# Patient Record
Sex: Female | Born: 1965
Health system: Southern US, Community
[De-identification: ages and names within clinical notes are randomized; demographics above are authoritative.]

## PROBLEM LIST (undated history)

## (undated) DIAGNOSIS — E119 Type 2 diabetes mellitus without complications: Secondary | ICD-10-CM

## (undated) DIAGNOSIS — I10 Essential (primary) hypertension: Secondary | ICD-10-CM

## (undated) HISTORY — PX: FOOT SURGERY: SHX648

---

## 2005-06-29 ENCOUNTER — Ambulatory Visit: Payer: Self-pay | Admitting: Family Medicine

## 2007-12-15 ENCOUNTER — Emergency Department: Payer: Self-pay | Admitting: Emergency Medicine

## 2009-08-28 ENCOUNTER — Emergency Department: Payer: Self-pay | Admitting: Internal Medicine

## 2013-02-26 ENCOUNTER — Emergency Department: Payer: Self-pay | Admitting: Emergency Medicine

## 2013-02-26 LAB — URINALYSIS, COMPLETE
Blood: NEGATIVE
Ketone: NEGATIVE
Leukocyte Esterase: NEGATIVE
RBC,UR: NONE SEEN /HPF (ref 0–5)
Specific Gravity: 1.02 (ref 1.003–1.030)
Squamous Epithelial: <1
WBC UR: <1 /HPF (ref 0–5)

## 2013-02-26 LAB — CBC
MCHC: 33.9 g/dL (ref 32.0–36.0)
MCV: 91 fL (ref 80–100)
Platelet: 252 10*3/uL (ref 150–440)
RBC: 3.85 10*6/uL (ref 3.80–5.20)
RDW: 12.6 % (ref 11.5–14.5)
WBC: 5.4 10*3/uL (ref 3.6–11.0)

## 2013-02-26 LAB — COMPREHENSIVE METABOLIC PANEL
Albumin: 3.5 g/dL (ref 3.4–5.0)
Anion Gap: 6 — ABNORMAL LOW (ref 7–16)
Bilirubin,Total: 0.2 mg/dL (ref 0.2–1.0)
Calcium, Total: 8.7 mg/dL (ref 8.5–10.1)
Chloride: 101 mmol/L (ref 98–107)
Co2: 29 mmol/L (ref 21–32)
Glucose: 343 mg/dL — ABNORMAL HIGH (ref 65–99)
Osmolality: 284 (ref 275–301)
Potassium: 3.5 mmol/L (ref 3.5–5.1)
SGOT(AST): 17 U/L (ref 15–37)
SGPT (ALT): 23 U/L (ref 12–78)

## 2013-05-01 ENCOUNTER — Ambulatory Visit: Payer: Self-pay

## 2014-11-05 ENCOUNTER — Ambulatory Visit: Payer: Self-pay

## 2016-04-28 ENCOUNTER — Encounter: Payer: Self-pay | Admitting: Emergency Medicine

## 2016-04-28 ENCOUNTER — Emergency Department
Admission: EM | Admit: 2016-04-28 | Discharge: 2016-04-28 | Disposition: A | Discharge status: Home / Self Care | Payer: BLUE CROSS/BLUE SHIELD | Attending: Emergency Medicine | Admitting: Emergency Medicine

## 2016-04-28 DIAGNOSIS — E119 Type 2 diabetes mellitus without complications: Secondary | ICD-10-CM | POA: Insufficient documentation

## 2016-04-28 DIAGNOSIS — L600 Ingrowing nail: Secondary | ICD-10-CM | POA: Diagnosis not present

## 2016-04-28 DIAGNOSIS — M79672 Pain in left foot: Secondary | ICD-10-CM | POA: Diagnosis present

## 2016-04-28 DIAGNOSIS — I10 Essential (primary) hypertension: Secondary | ICD-10-CM | POA: Diagnosis not present

## 2016-04-28 HISTORY — DX: Type 2 diabetes mellitus without complications: E11.9

## 2016-04-28 HISTORY — DX: Essential (primary) hypertension: I10

## 2016-04-28 MED ORDER — SULFAMETHOXAZOLE-TRIMETHOPRIM 800-160 MG PO TABS: 1.0000 | ORAL_TABLET | Freq: Two times a day (BID) | ORAL | 0 refills | Status: DC

## 2016-04-28 MED ORDER — TRAMADOL HCL 50 MG PO TABS: 50.0000 mg | ORAL_TABLET | Freq: Four times a day (QID) | ORAL | 0 refills | Status: DC | PRN

## 2016-04-28 NOTE — ED Provider Notes (Signed)
[facility] Emergency Department Provider Note   ____________________________________________   None    (approximate)  I have reviewed the triage vital signs and the nursing notes.   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 Patient states she had a pedicure 2 months ago. Patient states she was little bit concerned that the nail was cut back to far. Patient states  increased pain with ambulation at work today. Patient also noticedincreased swelling and redness at the medial nail bed. Patient is rating the pain as a 7/10. Patient describes pain as achy". No palliative measures taken for this complaint. Patient is a diabetic   Past Medical History:  Diagnosis Date  . Diabetes mellitus without complication (HCC)   . Hypertension     There are no active problems to display for this patient.   History reviewed. No pertinent surgical history.  Prior to Admission medications   Medication Sig Start Date End Date Taking? Authorizing Provider  sulfamethoxazole-trimethoprim (BACTRIM DS,SEPTRA DS) 800-160 MG tablet Take 1 tablet by mouth 2 (two) times daily. 04/28/16   Joni Reiningonald K Emmarie Sannes, PA-C  traMADol (ULTRAM) 50 MG tablet Take 1 tablet (50 mg total) by mouth every 6 (six) hours as needed. 04/28/16 04/28/17  Joni Reiningonald K Delson Dulworth, PA-C    Allergies Review of patient's allergies indicates no known allergies.  No family history on file.  Social History Social History  Substance Use Topics  . Smoking status: Never Smoker  . Smokeless tobacco: Never Used  . Alcohol use Yes    Review of Systems Constitutional: No fever/chills Eyes: No visual changes. ENT: No sore throat. Cardiovascular: Denies chest pain. Respiratory: Denies shortness of breath. Gastrointestinal: No abdominal pain.  No nausea, no vomiting.  No diarrhea.  No constipation. Genitourinary: Negative for  dysuria. Musculoskeletal: Negative for back pain. Skin: Negative for rash. Redness and swelling to the nail of the first digit left foot. Neurological: Negative for headaches, focal weakness or numbness. Endocrine:Diabetic ____________________________________________   PHYSICAL EXAM:  VITAL SIGNS: ED Triage Vitals [04/28/16 1923]  Enc Vitals Group     BP (!) 172/89     Pulse Rate 65     Resp 18     Temp 98 F (36.7 C)     Temp Source Oral     SpO2 100 %     Weight 155 lb (70.3 kg)     Height 4\' 11"  (1.499 m)     Head Circumference      Peak Flow      Pain Score 8     Pain Loc      Pain Edu?      Excl. in GC?     Constitutional: Alert and oriented. Well appearing and in no acute distress. Eyes: Conjunctivae are normal. PERRL. EOMI. Head: Atraumatic. Nose: No congestion/rhinnorhea. Mouth/Throat: Mucous membranes are moist.  Oropharynx non-erythematous. Neck: No stridor.  No cervical spine tenderness to palpation. Hematological/Lymphatic/Immunilogical: No cervical lymphadenopathy. Cardiovascular: Normal rate, regular rhythm. Grossly normal heart sounds.  Good peripheral circulation. Respiratory: Normal respiratory effort.  No retractions. Lungs CTAB. Gastrointestinal: Soft and nontender. No distention. No abdominal bruits. No CVA tenderness. Musculoskeletal: No lower extremity tenderness nor edema.  No joint effusions. Neurologic:  Normal speech and language. No gross focal neurologic deficits are appreciated. No gait instability. Skin:  Edema and erythema  medial nail first digit left foot. No active  drainage. Psychiatric: Mood and affect are normal. Speech and behavior are normal.  ____________________________________________   LABS (all labs ordered are listed, but only abnormal results are displayed)  Labs Reviewed - No data to  display ____________________________________________  EKG   ____________________________________________  RADIOLOGY   ____________________________________________   PROCEDURES  Procedure(s) performed: None  Procedures  Critical Care performed: No  ____________________________________________   INITIAL IMPRESSION / ASSESSMENT AND PLAN / ED COURSE  Pertinent labs & imaging results that were available during my care of the patient were reviewed by me and considered in my medical decision making (see chart for details).  Ingrown toenail first digit left foot. Patient given discharge care instructions. Patient given a prescription for Bactrim and tramadol. Patient advised absence also asked and follow-up with podiatry.  Clinical Course     ____________________________________________   FINAL CLINICAL IMPRESSION(S) / ED DIAGNOSES  Final diagnoses:  Ingrown toenail      NEW MEDICATIONS STARTED DURING THIS VISIT:  New Prescriptions   SULFAMETHOXAZOLE-TRIMETHOPRIM (BACTRIM DS,SEPTRA DS) 800-160 MG TABLET    Take 1 tablet by mouth 2 (two) times daily.   TRAMADOL (ULTRAM) 50 MG TABLET    Take 1 tablet (50 mg total) by mouth every 6 (six) hours as needed.     Note:  This document was prepared using Dragon voice recognition software and may include unintentional dictation errors.    Joni ReiningRonald K Venesa Semidey, PA-C 04/28/16 2015    Charlynne Panderavid Hsienta Yao, MD 04/30/16 2129

## 2016-04-28 NOTE — ED Triage Notes (Signed)
Pt reports had pedicure done 2 months ago, states toenail was cut and states is now having pain to her left great toe. Pt reports pain increases with ambulation. Pt is diabetic. No obvious swelling noted to toe.

## 2016-04-28 NOTE — ED Notes (Addendum)
Pt states she had a pedicure and now c/o L big toe pain. 1 scratch noticed to inside of toe, but no swelling noticed. States it hurts worse when walking on it. Pt able to move toe.   Pt ambulatory to toilet.

## 2016-08-10 ENCOUNTER — Ambulatory Visit (INDEPENDENT_AMBULATORY_CARE_PROVIDER_SITE_OTHER): Payer: Worker's Compensation

## 2016-08-10 ENCOUNTER — Ambulatory Visit
Admission: EM | Admit: 2016-08-10 | Discharge: 2016-08-10 | Disposition: A | Discharge status: Home / Self Care | Payer: Worker's Compensation | Attending: Emergency Medicine | Admitting: Emergency Medicine

## 2016-08-10 ENCOUNTER — Encounter: Payer: Self-pay | Admitting: *Deleted

## 2016-08-10 DIAGNOSIS — M25531 Pain in right wrist: Secondary | ICD-10-CM | POA: Diagnosis not present

## 2016-08-10 MED ORDER — IBUPROFEN 800 MG PO TABS: 800.0000 mg | ORAL_TABLET | Freq: Three times a day (TID) | ORAL | 0 refills | Status: DC

## 2016-08-10 MED ORDER — TRAMADOL HCL 50 MG PO TABS: ORAL_TABLET | ORAL | 0 refills | Status: DC

## 2016-08-10 NOTE — ED Triage Notes (Signed)
Patient started having right arm and hand pain while at work today.

## 2016-08-10 NOTE — Discharge Instructions (Signed)
Follow up with Tommie Maximiano CossAnne Moore, occupational health in 3 days for reevaluation.

## 2016-08-10 NOTE — ED Provider Notes (Signed)
HPI  SUBJECTIVE:  Ariana Jacobs is a right handed 50 y.o. female who presents with right wrist pain described as sore, throbbing, constant. She does a lot of repetitive physical activity at work where she picks up boxes and doing a lot of pronation supination and flexing and extending her wrist. She reports grip weakness secondary to pain. She states that the pain radiates up her arm, she denies any shoulder pain. She denies numbness, tingling, swelling her hand and wrist, and direct trauma to her wrist or hand. No erythema, bruising. There are no alleviating factors. She has not tried anything for this. Symptoms are worse with picking up boxes. She has a past medical history of diabetes, hypertension, states that she took her medications today. She has no history of carpal tunnel syndrome, wrist or hand injury, kidney disease, GI bleed. 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    Past Medical History:  Diagnosis Date  . Diabetes mellitus without complication (HCC)   . Hypertension     History reviewed. No pertinent surgical history.  History reviewed. No pertinent family history.  Social History  Substance Use Topics  . Smoking status: Never Smoker  . Smokeless tobacco: Never Used  . Alcohol use Yes    No current facility-administered medications for this encounter.   Current Outpatient Prescriptions:  .  GLIPIZIDE PO, Take by mouth daily., Disp: , Rfl:  .  HYDRALAZINE-HCTZ PO, Take by mouth daily., Disp: , Rfl:  .  LISINOPRIL PO, Take by mouth daily., Disp: , Rfl:  .  METFORMIN HCL PO, Take by mouth 2 (two) times daily., Disp: , Rfl:  .  ibuprofen (ADVIL,MOTRIN) 800 MG tablet, Take 1 tablet (800 mg total) by mouth 3 (three) times daily., Disp: 21 tablet, Rfl: 0 .  traMADol (ULTRAM) 50 MG tablet, 1-2 tabs po q 6 hr prn pain Maximum dose= 8 tablets per day, Disp: 20 tablet, Rfl: 0  No Known Allergies   ROS  As noted in HPI.   Physical Exam  BP (!) 182/89 (BP Location: Left  Arm)   Pulse 76   Temp 97.8 F (36.6 C) (Oral)   Resp 16   Ht 4\' 11"  (1.499 m)   Wt 153 lb (69.4 kg)   SpO2 100%   BMI 30.90 kg/m   BP Readings from Last 3 Encounters:  08/10/16 (!) 182/89  04/28/16 (!) 169/87    Constitutional: Well developed, well nourished, no acute distress Eyes:  EOMI, conjunctiva normal bilaterally HENT: Normocephalic, atraumatic,mucus membranes moist Respiratory: Normal inspiratory effort Cardiovascular: Normal rate GI: nondistended skin: No rash, skin intact Musculoskeletal: R wrist: Tenderness over the transverse carpal ligament on the volar aspect of her wrist, pain with extension and flexion, tenderness along the EPL/EPB. distal radius NT , distal ulnar styloid NT, snuffbox NT, carpals NT , metacarpals NT, digits NT , TFCC NT.   no pain with supination,  no pain with pronation, no pain with radial / ulnar deviation. Motor intact ability to flex / extend digits of affected hand, Sensation LT to hand normal, CR<2 seconds distally. Tinel pos . Phalen pos.  Finklestein neg.  Shoulder and upper arm NT, Elbow and proximal forearm NT on affected extremity. Neurologic: Alert & oriented x 3, no focal neuro deficits Psychiatric: Speech and behavior appropriate   ED Course   Medications - No data to display  Orders Placed This Encounter  Procedures  . DG Wrist Complete Right    Standing Status:   Standing  Number of Occurrences:   1    Order Specific Question:   Reason for Exam (SYMPTOM  OR DIAGNOSIS REQUIRED)    Answer:   wrist pain volar surface r/o OA, fx dislocation  . Thumb spica    Standing Status:   Standing    Number of Occurrences:   1    Order Specific Question:   Laterality    Answer:   Right    No results found for this or any previous visit (from the past 24 hour(s)). Dg Wrist Complete Right  Result Date: 08/10/2016 CLINICAL DATA:  Right wrist and hand pain at work today. Lateral palm pain extending to the wrist. Pain with flexion and  extension. EXAM: RIGHT WRIST - COMPLETE 3+ VIEW COMPARISON:  None. FINDINGS: There is no evidence of fracture or dislocation. There is no evidence of arthropathy or other focal bone abnormality. Soft tissues are unremarkable. IMPRESSION: Negative right wrist radiographs. Electronically Signed   By: Marin Robertshristopher  Mattern M.D.   On: 08/10/2016 20:54    ED Clinical Impression  Right wrist pain   ED Assessment/Plan  Presentation consistent with an overuse injury. She may also have a component of carpal tunnel syndrome. Placing in a thumb spica she does report some tenderness over the EPL/EPB. Imaging and reviewed. No fracture, dislocation. See radiology report for details. Follow up with Tommie Maximiano CossAnne Moore, occupational health in 3 days. In the meantime, light duty. Home with a short course of ibuprofen, tramadol as needed for severe pain.  Blood pressure noted. Patient does appear to be in a moderate amount of pain. States that she took her blood pressure medicines today.States her blood pressure normally runs 150s/80s to 90s. She thinks that her blood pressure is elevated because she did not eat well today. She denies chest pain, shortness of breath, headache, visual changes, dysarthria, or leg weakness, facial droop, pain tearing through to her back, abdominal pain, lower extremity edema, anuria or hematuria. We'll have her monitor her blood pressure at home.   Discussed imaging, MDM, plan and followup with patient.  Patient agrees with plan.   Meds ordered this encounter  Medications  . METFORMIN HCL PO    Sig: Take by mouth 2 (two) times daily.  Marland Kitchen. GLIPIZIDE PO    Sig: Take by mouth daily.  Marland Kitchen. LISINOPRIL PO    Sig: Take by mouth daily.  Marland Kitchen. HYDRALAZINE-HCTZ PO    Sig: Take by mouth daily.  Marland Kitchen. ibuprofen (ADVIL,MOTRIN) 800 MG tablet    Sig: Take 1 tablet (800 mg total) by mouth 3 (three) times daily.    Dispense:  21 tablet    Refill:  0  . traMADol (ULTRAM) 50 MG tablet    Sig: 1-2 tabs po q  6 hr prn pain Maximum dose= 8 tablets per day    Dispense:  20 tablet    Refill:  0    *This clinic note was created using Scientist, clinical (histocompatibility and immunogenetics)Dragon dictation software. Therefore, there may be occasional mistakes despite careful proofreading.  ?   Domenick GongAshley Camarie Mctigue, MD 08/10/16 2127

## 2016-08-13 ENCOUNTER — Telehealth: Payer: Self-pay

## 2016-08-13 NOTE — Telephone Encounter (Signed)
Courtesy call back completed today for patient's recent visit at [facility]. Patient did not answer, left message on machine to call back with any questions or concerns.   

## 2016-08-15 ENCOUNTER — Ambulatory Visit
Admission: EM | Admit: 2016-08-15 | Discharge: 2016-08-15 | Disposition: A | Discharge status: Home / Self Care | Payer: Worker's Compensation | Attending: Family Medicine | Admitting: Family Medicine

## 2016-08-15 NOTE — ED Notes (Signed)
Patient needed clarification on what she needed to do as far as getting re-evaluated for a W/C claim. I informed her to see Mirian Moommie Moore, NP to be re evaluated.

## 2016-08-16 ENCOUNTER — Ambulatory Visit
Admission: RE | Admit: 2016-08-16 | Discharge: 2016-08-16 | Disposition: A | Discharge status: Home / Self Care | Payer: Worker's Compensation | Source: Ambulatory Visit | Attending: Family | Admitting: Family

## 2016-08-16 ENCOUNTER — Ambulatory Visit
Admission: AD | Admit: 2016-08-16 | Discharge: 2016-08-16 | Disposition: A | Discharge status: Home / Self Care | Payer: Worker's Compensation | Source: Ambulatory Visit | Attending: Family | Admitting: Family

## 2016-08-16 ENCOUNTER — Other Ambulatory Visit: Payer: Self-pay | Admitting: Family

## 2016-08-16 DIAGNOSIS — M25511 Pain in right shoulder: Secondary | ICD-10-CM | POA: Diagnosis present

## 2016-08-16 DIAGNOSIS — M5030 Other cervical disc degeneration, unspecified cervical region: Secondary | ICD-10-CM | POA: Insufficient documentation

## 2016-08-16 DIAGNOSIS — R52 Pain, unspecified: Secondary | ICD-10-CM

## 2016-09-19 ENCOUNTER — Encounter: Payer: Self-pay | Admitting: Emergency Medicine

## 2016-09-19 ENCOUNTER — Emergency Department
Admission: EM | Admit: 2016-09-19 | Discharge: 2016-09-19 | Disposition: A | Discharge status: Home / Self Care | Payer: Commercial Managed Care - HMO | Attending: Emergency Medicine | Admitting: Emergency Medicine

## 2016-09-19 DIAGNOSIS — K0381 Cracked tooth: Secondary | ICD-10-CM | POA: Diagnosis not present

## 2016-09-19 DIAGNOSIS — L602 Onychogryphosis: Secondary | ICD-10-CM | POA: Diagnosis not present

## 2016-09-19 DIAGNOSIS — K029 Dental caries, unspecified: Secondary | ICD-10-CM | POA: Insufficient documentation

## 2016-09-19 DIAGNOSIS — E119 Type 2 diabetes mellitus without complications: Secondary | ICD-10-CM | POA: Diagnosis not present

## 2016-09-19 DIAGNOSIS — Z7984 Long term (current) use of oral hypoglycemic drugs: Secondary | ICD-10-CM | POA: Diagnosis not present

## 2016-09-19 DIAGNOSIS — I10 Essential (primary) hypertension: Secondary | ICD-10-CM | POA: Insufficient documentation

## 2016-09-19 DIAGNOSIS — K0889 Other specified disorders of teeth and supporting structures: Secondary | ICD-10-CM | POA: Diagnosis present

## 2016-09-19 MED ORDER — PENICILLIN V POTASSIUM 500 MG PO TABS: 500.0000 mg | ORAL_TABLET | Freq: Four times a day (QID) | ORAL | 0 refills | Status: DC

## 2016-09-19 NOTE — ED Triage Notes (Signed)
Pt presents to ED with c/o R sided upper jaw dental pain, pt states she knows she has 2 teeth that need to be pulled, states has dental appt on Feb 9. Pt also c/o L great toe ingrown toenail. Pt c/o pain with walking or putting on shoes.

## 2016-09-19 NOTE — ED Provider Notes (Signed)
[facility] Emergency Department Provider Note ____________________________________________  Time seen: 2015  I have reviewed the triage vital signs and the nursing notes.  HISTORY  Chief Complaint  Dental Pain and Toe Pain  HPI Ariana Jacobs is a 51 y.o. female presents to the ED for evaluation of pain to herleft great toe that is aggravated by use of steel-toe shoes at work. She denies any swelling, tenderness, or purulent collection around the cuticle. She reports some discomfort after she went to a local nail salon for pedicure. Just as a second, unrelated complaint of some pain to the right upper jaw. She reports poor dentition and multiple cavities and chronically broken teeth. She is scheduled to see her dental provider next month for some extractions. She denies any interim fevers, chills, sweats.  Past Medical History:  Diagnosis Date  . Diabetes mellitus without complication (HCC)   . Hypertension     There are no active problems to display for this patient.   Past Surgical History:  Procedure Laterality Date  . FOOT SURGERY      Prior to Admission medications   Medication Sig Start Date End Date Taking? Authorizing Provider  GLIPIZIDE PO Take by mouth daily.    Historical Provider, MD  HYDRALAZINE-HCTZ PO Take by mouth daily.    Historical Provider, MD  ibuprofen (ADVIL,MOTRIN) 800 MG tablet Take 1 tablet (800 mg total) by mouth 3 (three) times daily. 08/10/16   Domenick Gong, MD  LISINOPRIL PO Take by mouth daily.    Historical Provider, MD  METFORMIN HCL PO Take by mouth 2 (two) times daily.    Historical Provider, MD  penicillin v potassium (VEETID) 500 MG tablet Take 1 tablet (500 mg total) by mouth 4 (four) times daily. 09/19/16   Jeneen Doutt V Bacon Cara Aguino, PA-C  traMADol (ULTRAM) 50 MG tablet 1-2 tabs po q 6 hr prn pain Maximum dose= 8 tablets per day 08/10/16   Domenick Gong, MD    Allergies Patient has no known  allergies.  History reviewed. No pertinent family history.  Social History Social History  Substance Use Topics  . Smoking status: Never Smoker  . Smokeless tobacco: Never Used  . Alcohol use No     Comment: Occassionally    Review of Systems  Constitutional: Negative for fever. Eyes: Negative for visual changes. ENT: Negative for sore throat. Dental pain as above. Musculoskeletal: Negative for back pain. Left great toe pain as above. Skin: Negative for rash. Neurological: Negative for headaches, focal weakness or numbness. ____________________________________________  PHYSICAL EXAM:  VITAL SIGNS: ED Triage Vitals  Enc Vitals Group     BP 09/19/16 2007 (!) 182/89     Pulse Rate 09/19/16 2007 86     Resp 09/19/16 2007 18     Temp 09/19/16 2007 97.8 F (36.6 C)     Temp Source 09/19/16 2007 Oral     SpO2 09/19/16 2007 99 %     Weight 09/19/16 2007 154 lb (69.9 kg)     Height 09/19/16 2007 4\' 11"  (1.499 m)     Head Circumference --      Peak Flow --      Pain Score 09/19/16 2006 8     Pain Loc --      Pain Edu? --      Excl. in GC? --    Constitutional: Alert and oriented. Well appearing and in no distress. Head: Normocephalic and atraumatic. Eyes: Conjunctivae are normal. PERRL. Normal extraocular movements Ears:  Canals clear. TMs intact bilaterally. Nose: No congestion/rhinorrhea/epistaxis. Mouth/Throat: Mucous membranes are moist. Uvula is midline and tonsils are flat. No oropharyngeal lesions are appreciated. Patient with poor dentition globally. She has a fractured upper and lower molars on the right tonsillar gumline. There is some local gum irritation and edema noted. No brawny erythema noted to the floor of the mouth. Neck: Supple. No thyromegaly. Hematological/Lymphatic/Immunological: No cervical lymphadenopathy. Cardiovascular: Normal rate, regular rhythm. Normal distal pulses. Respiratory: Normal respiratory effort.  Musculoskeletal: Left great toe with  hypertrophic nails. No local erythema or paronychia noted. No indication of ingrown nail. Nontender with normal range of motion in all extremities.  Neurologic:  Normal gait without ataxia. Normal speech and language. No gross focal neurologic deficits are appreciated. Skin:  Skin is warm, dry and intact. No rash noted. ____________________________________________  INITIAL IMPRESSION / ASSESSMENT AND PLAN / ED COURSE  Patient with dental pain management of caries and chronic fractured teeth. She is discharged with a prescription for Pen-Vee K to dose as prescribed for dental abscess. She is referred to podiatry for further evaluation and routine foot care given her diabetic history. She will follow with her primary provider as scheduled next month for definitive treatment.  Clinical Course    ____________________________________________  FINAL CLINICAL IMPRESSION(S) / ED DIAGNOSES  Final diagnoses:  Dental caries  Hypertrophic toenail      Lissa HoardJenise V Bacon Tylin Stradley, PA-C 09/19/16 2235    Minna AntisKevin Paduchowski, MD 09/19/16 [phone]

## 2016-09-19 NOTE — ED Notes (Signed)
See this RN's triage note. Pt c/o R upper jaw dental pain, and L great toe pain.

## 2016-09-19 NOTE — Discharge Instructions (Signed)
Take the antibiotic as directed. Follow-up with your dental provider as scheduled. See Dr. Orland Jarredroxler for routine nail care.

## 2016-09-19 NOTE — ED Notes (Signed)
PA aware of patient's BP. Pt states took her BP meds PTA, not this AM like she is supposed to. PA states ok for D/C.

## 2016-09-19 NOTE — ED Notes (Signed)
NAD noted at time of D/C. Pt denies questions or concerns. Pt ambulatory to the lobby at this time.  

## 2016-10-07 ENCOUNTER — Other Ambulatory Visit: Payer: Self-pay | Admitting: Family Medicine

## 2016-10-07 DIAGNOSIS — D649 Anemia, unspecified: Secondary | ICD-10-CM | POA: Diagnosis not present

## 2016-10-07 DIAGNOSIS — Z0000 Encounter for general adult medical examination without abnormal findings: Secondary | ICD-10-CM | POA: Diagnosis not present

## 2016-10-07 DIAGNOSIS — N926 Irregular menstruation, unspecified: Secondary | ICD-10-CM | POA: Diagnosis not present

## 2016-10-07 DIAGNOSIS — Z1231 Encounter for screening mammogram for malignant neoplasm of breast: Secondary | ICD-10-CM | POA: Diagnosis not present

## 2016-10-07 DIAGNOSIS — E119 Type 2 diabetes mellitus without complications: Secondary | ICD-10-CM | POA: Diagnosis not present

## 2016-10-07 DIAGNOSIS — Z23 Encounter for immunization: Secondary | ICD-10-CM | POA: Diagnosis not present

## 2016-12-07 DIAGNOSIS — T783XXA Angioneurotic edema, initial encounter: Secondary | ICD-10-CM | POA: Diagnosis not present

## 2016-12-12 DIAGNOSIS — Z1211 Encounter for screening for malignant neoplasm of colon: Secondary | ICD-10-CM | POA: Diagnosis not present

## 2017-01-02 ENCOUNTER — Encounter: Payer: Self-pay | Admitting: Emergency Medicine

## 2017-01-02 ENCOUNTER — Emergency Department
Admission: EM | Admit: 2017-01-02 | Discharge: 2017-01-02 | Disposition: A | Discharge status: Home / Self Care | Payer: Commercial Managed Care - HMO | Attending: Emergency Medicine | Admitting: Emergency Medicine

## 2017-01-02 ENCOUNTER — Emergency Department: Payer: Commercial Managed Care - HMO

## 2017-01-02 DIAGNOSIS — Z79899 Other long term (current) drug therapy: Secondary | ICD-10-CM | POA: Diagnosis not present

## 2017-01-02 DIAGNOSIS — M25552 Pain in left hip: Secondary | ICD-10-CM | POA: Diagnosis not present

## 2017-01-02 DIAGNOSIS — R1032 Left lower quadrant pain: Secondary | ICD-10-CM | POA: Diagnosis not present

## 2017-01-02 DIAGNOSIS — E119 Type 2 diabetes mellitus without complications: Secondary | ICD-10-CM | POA: Insufficient documentation

## 2017-01-02 DIAGNOSIS — Z7984 Long term (current) use of oral hypoglycemic drugs: Secondary | ICD-10-CM | POA: Diagnosis not present

## 2017-01-02 DIAGNOSIS — I10 Essential (primary) hypertension: Secondary | ICD-10-CM | POA: Insufficient documentation

## 2017-01-02 LAB — CBC WITH DIFFERENTIAL/PLATELET
BASOS ABS: 0.1 10*3/uL (ref 0–0.1)
BASOS PCT: 1 %
EOS ABS: 0.1 10*3/uL (ref 0–0.7)
EOS PCT: 2 %
HCT: 34.6 % — ABNORMAL LOW (ref 35.0–47.0)
Hemoglobin: 11.5 g/dL — ABNORMAL LOW (ref 12.0–16.0)
Lymphocytes Relative: 35 %
Lymphs Abs: 2 10*3/uL (ref 1.0–3.6)
MCH: 30.1 pg (ref 26.0–34.0)
MCHC: 33.2 g/dL (ref 32.0–36.0)
MCV: 90.7 fL (ref 80.0–100.0)
MONO ABS: 0.5 10*3/uL (ref 0.2–0.9)
Monocytes Relative: 8 %
Neutro Abs: 3.1 10*3/uL (ref 1.4–6.5)
Neutrophils Relative %: 54 %
PLATELETS: 286 10*3/uL (ref 150–440)
RBC: 3.81 MIL/uL (ref 3.80–5.20)
RDW: 13.5 % (ref 11.5–14.5)
WBC: 5.8 10*3/uL (ref 3.6–11.0)

## 2017-01-02 LAB — URINALYSIS, COMPLETE (UACMP) WITH MICROSCOPIC
BILIRUBIN URINE: NEGATIVE
Bacteria, UA: NONE SEEN
Glucose, UA: NEGATIVE mg/dL
HGB URINE DIPSTICK: NEGATIVE
Ketones, ur: NEGATIVE mg/dL
LEUKOCYTES UA: NEGATIVE
NITRITE: NEGATIVE
PH: 6 (ref 5.0–8.0)
Protein, ur: NEGATIVE mg/dL
SPECIFIC GRAVITY, URINE: 1.02 (ref 1.005–1.030)
WBC, UA: NONE SEEN WBC/hpf (ref 0–5)

## 2017-01-02 LAB — COMPREHENSIVE METABOLIC PANEL
ALBUMIN: 3.9 g/dL (ref 3.5–5.0)
ALK PHOS: 94 U/L (ref 38–126)
ALT: 16 U/L (ref 14–54)
ANION GAP: 9 (ref 5–15)
AST: 19 U/L (ref 15–41)
BILIRUBIN TOTAL: 0.4 mg/dL (ref 0.3–1.2)
BUN: 18 mg/dL (ref 6–20)
CALCIUM: 9.7 mg/dL (ref 8.9–10.3)
CO2: 28 mmol/L (ref 22–32)
CREATININE: 0.77 mg/dL (ref 0.44–1.00)
Chloride: 101 mmol/L (ref 101–111)
GFR calc Af Amer: >60 mL/min (ref >60)
GFR calc non Af Amer: >60 mL/min (ref >60)
GLUCOSE: 124 mg/dL — AB (ref 65–99)
Potassium: 3.1 mmol/L — ABNORMAL LOW (ref 3.5–5.1)
Sodium: 138 mmol/L (ref 135–145)
TOTAL PROTEIN: 7.8 g/dL (ref 6.5–8.1)

## 2017-01-02 LAB — POCT PREGNANCY, URINE: PREG TEST UR: NEGATIVE

## 2017-01-02 MED ORDER — DICLOFENAC SODIUM 50 MG PO TBEC: 50.0000 mg | DELAYED_RELEASE_TABLET | Freq: Two times a day (BID) | ORAL | 1 refills | Status: DC

## 2017-01-02 MED ORDER — CYCLOBENZAPRINE HCL 5 MG PO TABS: 5.0000 mg | ORAL_TABLET | Freq: Three times a day (TID) | ORAL | 0 refills | Status: DC | PRN

## 2017-01-02 NOTE — Discharge Instructions (Signed)
Your exam, labs, and x-rays are normal at this time. Take the prescription meds as directed. Follow-up with Dr. Carlynn Purl for continued symptoms. Return to the ED as needed.

## 2017-01-02 NOTE — ED Notes (Signed)

## 2017-01-02 NOTE — ED Notes (Signed)
See triage note  Presents with left lower abd pain for couple of days   Pain is mainly in groin area and moves into leg.denies any injury or urinary sx's

## 2017-01-02 NOTE — ED Triage Notes (Signed)
Pt with left groin pain that radiates down into her left leg started today. Pt arrived by ems. NAD. VSS.

## 2017-01-02 NOTE — ED Triage Notes (Signed)
Pt here via [facility] with c/o lower abdominal pain that radiates down leg. Pt denies UTI sx.

## 2017-01-08 NOTE — ED Provider Notes (Signed)
[facility] Emergency Department Provider Note ____________________________________________  Time seen: 1805  I have reviewed the triage vital signs and the nursing notes.  HISTORY  Chief Complaint  Groin Pain   HPI Ariana Jacobs is a 51 y.o. female Presents to the ED for evaluation of left groin pain that she describes as radiating to her left leg that started today. She arrives from home via EMS for her injuries and complaints. She also notes some lower abdominal pain that radiates into the leg. She denies any fevers, chills, sweats, nausea, vomiting, or diarrhea. She denies any recent injury, accident, trauma. She also denies any urinary symptoms, fevers, chills, sweats.  Past Medical History:  Diagnosis Date  . Diabetes mellitus without complication (HCC)   . Hypertension     There are no active problems to display for this patient.   Past Surgical History:  Procedure Laterality Date  . FOOT SURGERY      Prior to Admission medications   Medication Sig Start Date End Date Taking? Authorizing Provider  cyclobenzaprine (FLEXERIL) 5 MG tablet Take 1 tablet (5 mg total) by mouth 3 (three) times daily as needed for muscle spasms. 01/02/17   Eythan Jayne, Charlesetta IvoryJenise V Bacon, PA-C  diclofenac (VOLTAREN) 50 MG EC tablet Take 1 tablet (50 mg total) by mouth 2 (two) times daily. 01/02/17   Tysen Roesler, Charlesetta IvoryJenise V Bacon, PA-C  GLIPIZIDE PO Take by mouth daily.    [provider]  HYDRALAZINE-HCTZ PO Take by mouth daily.    [provider]  ibuprofen (ADVIL,MOTRIN) 800 MG tablet Take 1 tablet (800 mg total) by mouth 3 (three) times daily. 08/10/16   Domenick GongMortenson, Ashley, MD  LISINOPRIL PO Take by mouth daily.    [provider]  METFORMIN HCL PO Take by mouth 2 (two) times daily.    [provider]  penicillin v potassium (VEETID) 500 MG tablet Take 1 tablet (500 mg total) by mouth 4 (four) times daily. 09/19/16   Rhealynn Myhre, Charlesetta IvoryJenise V Bacon, PA-C   traMADol (ULTRAM) 50 MG tablet 1-2 tabs po q 6 hr prn pain Maximum dose= 8 tablets per day 08/10/16   Domenick GongMortenson, Ashley, MD    Allergies Patient has no known allergies.  No family history on file.  Social History Social History  Substance Use Topics  . Smoking status: Never Smoker  . Smokeless tobacco: Never Used  . Alcohol use No     Comment: Occassionally    Review of Systems  Constitutional: Negative for fever. Cardiovascular: Negative for chest pain. Respiratory: Negative for shortness of breath. Gastrointestinal: Positive for abdominal pain, Denies vomiting and diarrhea. Genitourinary: Negative for dysuria, or vaginal discharge. Musculoskeletal: Negative for back pain. Reports left groin pain  Skin: Negative for rash. Neurological: Negative for headaches, focal weakness or numbness. ____________________________________________  PHYSICAL EXAM:  VITAL SIGNS: ED Triage Vitals  Enc Vitals Group     BP 01/02/17 1707 (!) 152/71     Pulse Rate 01/02/17 1707 65     Resp 01/02/17 1707 20     Temp 01/02/17 1707 98.3 F (36.8 C)     Temp Source 01/02/17 1707 Oral     SpO2 01/02/17 1707 100 %     Weight 01/02/17 1708 154 lb (69.9 kg)     Height --      Head Circumference --      Peak Flow --      Pain Score 01/02/17 1707 10     Pain Loc --  Pain Edu? --      Excl. in GC? --     Constitutional: Alert and oriented. Well appearing and in no distress. Head: Normocephalic and atraumatic. Cardiovascular: Normal rate, regular rhythm. Normal distal pulses. Respiratory: Normal respiratory effort. No wheezes/rales/rhonchi. Gastrointestinal: Soft and nontender. No distention, Rebound, guarding, organomegaly, or rigidity., Bowel sounds 4. No CVA tenderness appreciated. Musculoskeletal: Nontender with normal range of motion in all extremities.  Neurologic:  Normal gait without ataxia. Normal speech and language. No gross focal neurologic deficits are appreciated. Skin:   Skin is warm, dry and intact. No rash noted. Psychiatric: Mood and affect are normal. Patient exhibits appropriate insight and judgment. ____________________________________________   LABS (pertinent positives/negatives) Labs Reviewed  COMPREHENSIVE METABOLIC PANEL - Abnormal; Notable for the following:       Result Value   Potassium 3.1 (*)    Glucose, Bld 124 (*)    All other components within normal limits  CBC WITH DIFFERENTIAL/PLATELET - Abnormal; Notable for the following:    Hemoglobin 11.5 (*)    HCT 34.6 (*)    All other components within normal limits  URINALYSIS, COMPLETE (UACMP) WITH MICROSCOPIC - Abnormal; Notable for the following:    Color, Urine YELLOW (*)    APPearance CLEAR (*)    Squamous Epithelial / LPF 0-5 (*)    All other components within normal limits  POC URINE PREG, ED  POCT PREGNANCY, URINE  ____________________________________________   RADIOLOGY  Left Hip/Pelvis  IMPRESSION: No significant abnormality. ____________________________________________  INITIAL IMPRESSION / ASSESSMENT AND PLAN / ED COURSE  Philip patient with initial evaluation of hip and groin pain with some referral into the lower abdomen. Her exam is been benign and her labs and x-rays are all within normal limits. Patient with a diagnosis of musculoskeletal hip pain. She'll be discharged with a prescription of cyclobenzaprine and diclofenac to dose as directed. She will follow up Dr. Carlynn Purl for ongoing symptom management. Return precautions are reviewed. ____________________________________________  FINAL CLINICAL IMPRESSION(S) / ED DIAGNOSES  Final diagnoses:  Acute hip pain, left      Karmen Stabs, Charlesetta Ivory, PA-C 01/08/17 0110    Merrily Brittle, MD 01/08/17 1128

## 2017-04-04 ENCOUNTER — Ambulatory Visit
Admission: EM | Admit: 2017-04-04 | Discharge: 2017-04-04 | Disposition: A | Discharge status: Home / Self Care | Payer: 59 | Attending: Family Medicine | Admitting: Family Medicine

## 2017-04-04 DIAGNOSIS — R197 Diarrhea, unspecified: Secondary | ICD-10-CM

## 2017-04-04 DIAGNOSIS — R110 Nausea: Secondary | ICD-10-CM | POA: Diagnosis not present

## 2017-04-04 DIAGNOSIS — K529 Noninfective gastroenteritis and colitis, unspecified: Secondary | ICD-10-CM | POA: Diagnosis not present

## 2017-04-04 MED ORDER — RANITIDINE HCL 150 MG PO CAPS: ORAL_CAPSULE | ORAL | 0 refills | Status: DC

## 2017-04-04 MED ORDER — ONDANSETRON 8 MG PO TBDP: 8.0000 mg | ORAL_TABLET | Freq: Three times a day (TID) | ORAL | 0 refills | Status: DC | PRN

## 2017-04-04 MED ORDER — BISMUTH SUBSALICYLATE 262 MG PO CHEW: CHEWABLE_TABLET | ORAL | 0 refills | Status: DC

## 2017-04-04 NOTE — ED Provider Notes (Signed)
[facility]    CSN: [id number] Arrival date & time: 04/04/17  1851     History   Chief Complaint Chief Complaint  Patient presents with  . Diarrhea    HPI Ariana Jacobs is a 51 y.o. female.   Patient's here because of diarrhea she reports eating the [employer] earlier today a few hours later she's had multiple episodes of diarrhea she felt so weak and lightheaded that she is able to function at work. She finds the work. She states she's had she feels weak and tired now is has some diarrhea since short time ago. She's has nausea but no vomiting. No previous abdominal surgeries she does not smoke she has diabetes and hypertension. She states she was nauseous but no vomiting. No pertinent family medical history of today's visit her no known drug allergies. She denies a [facility] sick around her.   The history is provided by the patient. No language interpreter was used.  Diarrhea  Quality:  Malodorous and copious Severity:  Moderate Onset quality:  Sudden Timing:  Constant Progression:  Worsening Relieved by:  Nothing Ineffective treatments:  None tried Associated symptoms: abdominal pain, chills, headaches and myalgias   Associated symptoms: no fever   Risk factors: recent antibiotic use     Past Medical History:  Diagnosis Date  . Diabetes mellitus without complication (HCC)   . Hypertension     There are no active problems to display for this patient.   Past Surgical History:  Procedure Laterality Date  . FOOT SURGERY      OB History    No data available       Home Medications    Prior to Admission medications   Medication Sig Start Date End Date Taking? Authorizing Provider  cyclobenzaprine (FLEXERIL) 5 MG tablet Take 1 tablet (5 mg total) by mouth 3 (three) times daily as needed for muscle spasms. 01/02/17  Yes Menshew, Charlesetta IvoryJenise V Bacon, PA-C  diclofenac (VOLTAREN) 50 MG EC tablet Take 1 tablet (50 mg total) by mouth 2 (two) times daily. 01/02/17   Yes Menshew, Charlesetta IvoryJenise V Bacon, PA-C  GLIPIZIDE PO Take by mouth daily.   Yes [provider]  HYDRALAZINE-HCTZ PO Take by mouth daily.   Yes [provider]  ibuprofen (ADVIL,MOTRIN) 800 MG tablet Take 1 tablet (800 mg total) by mouth 3 (three) times daily. 08/10/16  Yes Domenick GongMortenson, Ashley, MD  LISINOPRIL PO Take by mouth daily.   Yes [provider]  METFORMIN HCL PO Take by mouth 2 (two) times daily.   Yes [provider]  traMADol (ULTRAM) 50 MG tablet 1-2 tabs po q 6 hr prn pain Maximum dose= 8 tablets per day 08/10/16  Yes Domenick GongMortenson, Ashley, MD  bismuth subsalicylate (PEPTO-BISMOL) 262 MG chewable tablet 2 tablets 3 times a day with Zantac 3-5 days 04/04/17   Hassan RowanWade, Refugio Mcconico, MD  ondansetron (ZOFRAN ODT) 8 MG disintegrating tablet Take 1 tablet (8 mg total) by mouth every 8 (eight) hours as needed for nausea or vomiting. 04/04/17   Hassan RowanWade, Abdirahman Chittum, MD  penicillin v potassium (VEETID) 500 MG tablet Take 1 tablet (500 mg total) by mouth 4 (four) times daily. 09/19/16   Menshew, Charlesetta IvoryJenise V Bacon, PA-C  ranitidine (ZANTAC) 150 MG capsule 1 tablet 3 times a day for 3-5 days 04/04/17   Hassan RowanWade, Jentzen Minasyan, MD    Family History History reviewed. No pertinent family history.  Social History Social History  Substance Use Topics  . Smoking status: Never Smoker  .  Smokeless tobacco: Never Used  . Alcohol use No     Comment: Occassionally     Allergies   Patient has no known allergies.   Review of Systems Review of Systems  Constitutional: Positive for chills. Negative for fever.  Gastrointestinal: Positive for abdominal pain and diarrhea.  Musculoskeletal: Positive for myalgias.  Neurological: Positive for headaches.  All other systems reviewed and are negative.    Physical Exam Triage Vital Signs ED Triage Vitals  Enc Vitals Group     BP 04/04/17 1914 (!) 156/80     Pulse Rate 04/04/17 1914 73     Resp 04/04/17 1914 18     Temp 04/04/17 1914 98.6 F (37 C)      Temp Source 04/04/17 1914 Oral     SpO2 04/04/17 1914 100 %     Weight 04/04/17 1911 159 lb (72.1 kg)     Height 04/04/17 1911 4\' 11"  (1.499 m)     Head Circumference --      Peak Flow --      Pain Score 04/04/17 1913 4     Pain Loc --      Pain Edu? --      Excl. in GC? --    No data found.   Updated Vital Signs BP (!) 156/80 (BP Location: Left Arm)   Pulse 73   Temp 98.6 F (37 C) (Oral)   Resp 18   Ht 4\' 11"  (1.499 m)   Wt 159 lb (72.1 kg)   SpO2 100%   BMI 32.11 kg/m   Visual Acuity Right Eye Distance:   Left Eye Distance:   Bilateral Distance:    Right Eye Near:   Left Eye Near:    Bilateral Near:     Physical Exam  Constitutional: She is oriented to person, place, and time. She appears well-developed and well-nourished.  HENT:  Head: Normocephalic.  Eyes: Pupils are equal, round, and reactive to light.  Neck: Normal range of motion. Neck supple.  Cardiovascular: Normal rate, regular rhythm and normal heart sounds.   Pulmonary/Chest: Effort normal.  Abdominal: Soft. Bowel sounds are normal. She exhibits no distension. There is no tenderness.  Musculoskeletal: Normal range of motion.  Neurological: She is alert and oriented to person, place, and time.  Skin: Skin is warm.  Psychiatric: She has a normal mood and affect.  Vitals reviewed.    UC Treatments / Results  Labs (all labs ordered are listed, but only abnormal results are displayed) Labs Reviewed - No data to display  EKG  EKG Interpretation None       Radiology No results found.  Procedures Procedures (including critical care time)  Medications Ordered in UC Medications - No data to display   Initial Impression / Assessment and Plan / UC Course  I have reviewed the triage vital signs and the nursing notes.  Pertinent labs & imaging results that were available during my care of the patient were reviewed by me and considered in my medical decision making (see chart for  details).    Offered to do PCR's stool but patient is unable to give us that at this time. Since she's unable to give a stool Place on Zantac 3 times a day 150 Pepto-Bismol double strength 3 times a day also for the next 3-5 days Zofran for nausea recommend Imodium over-the-counter will give her a work note for Wednesday and Thursday no return back to work on Friday since she had to miss part  of work today will also include for Tuesday as well and see PCP if not better next 3-5 days.   Final Clinical Impressions(s) / UC Diagnoses   Final diagnoses:  Diarrhea of presumed infectious origin  Nausea  Gastroenteritis    New Prescriptions New Prescriptions   BISMUTH SUBSALICYLATE (PEPTO-BISMOL) 262 MG CHEWABLE TABLET    2 tablets 3 times a day with Zantac 3-5 days   ONDANSETRON (ZOFRAN ODT) 8 MG DISINTEGRATING TABLET    Take 1 tablet (8 mg total) by mouth every 8 (eight) hours as needed for nausea or vomiting.   RANITIDINE (ZANTAC) 150 MG CAPSULE    1 tablet 3 times a day for 3-5 days    Note: This dictation was prepared with Dragon dictation along with smaller phrase technology. Any transcriptional errors that result from this process are unintentional.   Hassan Rowan, MD 04/04/17 2035

## 2017-04-04 NOTE — ED Triage Notes (Signed)
Patient complains of diarrhea since 10am. [facility] before episode started. Patient states that she has been nausea and having a headache.

## 2017-10-16 ENCOUNTER — Other Ambulatory Visit: Payer: Self-pay

## 2017-10-16 ENCOUNTER — Encounter: Payer: Self-pay | Admitting: *Deleted

## 2017-10-16 ENCOUNTER — Ambulatory Visit
Admission: EM | Admit: 2017-10-16 | Discharge: 2017-10-16 | Disposition: A | Discharge status: Home / Self Care | Payer: 59 | Attending: Family Medicine | Admitting: Family Medicine

## 2017-10-16 DIAGNOSIS — M79602 Pain in left arm: Secondary | ICD-10-CM

## 2017-10-16 DIAGNOSIS — M62838 Other muscle spasm: Secondary | ICD-10-CM

## 2017-10-16 DIAGNOSIS — I10 Essential (primary) hypertension: Secondary | ICD-10-CM

## 2017-10-16 DIAGNOSIS — M25512 Pain in left shoulder: Secondary | ICD-10-CM

## 2017-10-16 MED ORDER — DICLOFENAC SODIUM 75 MG PO TBEC: 75.0000 mg | DELAYED_RELEASE_TABLET | Freq: Two times a day (BID) | ORAL | 0 refills | Status: DC | PRN

## 2017-10-16 MED ORDER — TRAMADOL HCL 50 MG PO TABS: 50.0000 mg | ORAL_TABLET | Freq: Four times a day (QID) | ORAL | 0 refills | Status: DC | PRN

## 2017-10-16 MED ORDER — CYCLOBENZAPRINE HCL 10 MG PO TABS: ORAL_TABLET | ORAL | 0 refills | Status: DC

## 2017-10-16 NOTE — ED Provider Notes (Signed)
[facility]    CSN: [id number] Arrival date & time: 10/16/17  0907     History   Chief Complaint Chief Complaint  Patient presents with  . Arm Pain    HPI Ariana Jacobs is a 52 y.o. female.   52 year old female presents with left arm pain that started yesterday. Started in her left scapular area and radiates from her shoulder down to her wrist. Does a lot of lifting of boxes as work but no distinct injury. Pain worse with activity. She denies any chest pain, shortness of breath, dizziness or numbness. She has taken Ibuprofen with minimal relief. Has history of previous pain/muscle spasms with both arms and hands as well as left leg. Has taken a muscle relaxer and Tramadol in the past with good success. Other chronic health issues include HTN (not well controlled) and Diabetes. She is currently on Lisinopril, HCTZ, Metformin and Glipizide daily.    The history is provided by the patient.    Past Medical History:  Diagnosis Date  . Diabetes mellitus without complication (HCC)   . Hypertension     There are no active problems to display for this patient.   Past Surgical History:  Procedure Laterality Date  . FOOT SURGERY      OB History    No data available       Home Medications    Prior to Admission medications   Medication Sig Start Date End Date Taking? Authorizing Provider  GLIPIZIDE PO Take by mouth daily.   Yes [provider]  HYDRALAZINE-HCTZ PO Take by mouth daily.   Yes [provider]  LISINOPRIL PO Take by mouth daily.   Yes [provider]  METFORMIN HCL PO Take by mouth 2 (two) times daily.   Yes [provider]  cyclobenzaprine (FLEXERIL) 10 MG tablet Take 1/2 to 1 tablet by mouth every 8 hours as needed for muscle spasms 10/16/17   Sudie GrumblingAmyot, Baker Moronta Berry, NP  diclofenac (VOLTAREN) 75 MG EC tablet Take 1 tablet (75 mg total) by mouth 2 (two) times daily as needed for moderate pain. 10/16/17   Sudie GrumblingAmyot, Sahil Milner  Berry, NP  traMADol (ULTRAM) 50 MG tablet Take 1 tablet (50 mg total) by mouth every 6 (six) hours as needed for severe pain. 10/16/17   Sudie GrumblingAmyot, Marnie Fazzino Berry, NP    Family History Family History  Problem Relation Age of Onset  . Healthy Mother   . Diabetes Father     Social History Social History   Tobacco Use  . Smoking status: Never Smoker  . Smokeless tobacco: Never Used  Substance Use Topics  . Alcohol use: No    Comment: Occassionally  . Drug use: No     Allergies   Patient has no known allergies.   Review of Systems Review of Systems  Constitutional: Negative for appetite change, chills, diaphoresis, fatigue and fever.  Eyes: Negative for photophobia and visual disturbance.  Respiratory: Negative for cough, chest tightness, shortness of breath and wheezing.   Cardiovascular: Negative for chest pain and palpitations.  Gastrointestinal: Negative for abdominal pain, nausea and vomiting.  Musculoskeletal: Positive for arthralgias, back pain (left upper thoracic) and myalgias. Negative for neck pain and neck stiffness.  Skin: Negative for rash and wound.  Neurological: Negative for dizziness, tremors, seizures, syncope, facial asymmetry, weakness, light-headedness, numbness and headaches.  Hematological: Negative for adenopathy. Does not bruise/bleed easily.  Psychiatric/Behavioral: Negative.      Physical Exam Triage Vital Signs ED  Triage Vitals  Enc Vitals Group     BP 10/16/17 0932 (!) 182/75     Pulse Rate 10/16/17 0932 67     Resp 10/16/17 0932 16     Temp 10/16/17 0932 98.3 F (36.8 C)     Temp Source 10/16/17 0932 Oral     SpO2 10/16/17 0932 100 %     Weight 10/16/17 0934 157 lb (71.2 kg)     Height 10/16/17 0934 4\' 11"  (1.499 m)     Head Circumference --      Peak Flow --      Pain Score 10/16/17 0934 10     Pain Loc --      Pain Edu? --      Excl. in GC? --    No data found.  Updated Vital Signs BP (!) 182/75 (BP Location: Left Arm)   Pulse 67    Temp 98.3 F (36.8 C) (Oral)   Resp 16   Ht 4\' 11"  (1.499 m)   Wt 157 lb (71.2 kg)   SpO2 100%   BMI 31.71 kg/m   Visual Acuity Right Eye Distance:   Left Eye Distance:   Bilateral Distance:    Right Eye Near:   Left Eye Near:    Bilateral Near:     Physical Exam  Constitutional: She is oriented to person, place, and time. She appears well-developed and well-nourished. No distress.  She is sitting in exam chair rubbing her left lower arm and hand and appears in pain.   HENT:  Head: Normocephalic and atraumatic.  Right Ear: External ear normal.  Left Ear: External ear normal.  Eyes: Conjunctivae and EOM are normal.  Neck: Normal range of motion. Neck supple.  Cardiovascular: Normal rate, regular rhythm and normal heart sounds.  No murmur heard. Pulmonary/Chest: Effort normal and breath sounds normal. No stridor. No respiratory distress. She has no decreased breath sounds. She has no wheezes. She has no rhonchi.  Musculoskeletal: She exhibits tenderness. She exhibits no edema.       Left shoulder: She exhibits decreased range of motion, tenderness, pain (along both radial and ulnar nerve pathways) and spasm. She exhibits no swelling, no effusion, no deformity, normal pulse and normal strength.       Arms: Decreased range of motion of left shoulder and upper arm, especially with abduction. Tender along upper left scapular area. Muscle spasm present. Also tender at anterior aspect of rotator cuff. Good strength and no distinct neuro deficits noted. Good pulses and capillary refill.   Neurological: She is alert and oriented to person, place, and time. She has normal strength. No sensory deficit.  Skin: Skin is warm and dry. Capillary refill takes less than 2 seconds. No rash noted.  Psychiatric: She has a normal mood and affect. Her behavior is normal. Judgment and thought content normal.     UC Treatments / Results  Labs (all labs ordered are listed, but only abnormal results  are displayed) Labs Reviewed - No data to display  EKG  EKG Interpretation None       Radiology No results found.  Procedures Procedures (including critical care time)  Medications Ordered in UC Medications - No data to display   Initial Impression / Assessment and Plan / UC Course  I have reviewed the triage vital signs and the nursing notes.  Pertinent labs & imaging results that were available during my care of the patient were reviewed by me and considered in my medical decision  making (see chart for details).    Discussed with patient that she may have a muscle strain with nerve irritation. Offered Toradol IM but patient declined. Recommend start Voltaren 75mg  twice a day as directed for pain. May take Flexeril 10mg  - take 1/2 to 1 whole tablet every 8 hours as needed for muscle spasms. For severe pain, may take Tramadol 50mg  1 tablet every 6 to 8 hours as needed. Recommend apply warm compresses to area and may alternate with ice for comfort. Note written for work. Briefly reviewed elevated blood pressure- need to follow-up with her PCP for further management. Continue to monitor. Follow-up with her PCP in 3 days if pain is not improving or go to the ER if pain worsens.    Final Clinical Impressions(s) / UC Diagnoses   Final diagnoses:  Left arm pain  Muscle spasm of left shoulder area  Elevated blood pressure reading with diagnosis of hypertension    ED Discharge Orders        Ordered    diclofenac (VOLTAREN) 75 MG EC tablet  2 times daily PRN     10/16/17 1032    cyclobenzaprine (FLEXERIL) 10 MG tablet     10/16/17 1032    traMADol (ULTRAM) 50 MG tablet  Every 6 hours PRN     10/16/17 1034       Controlled Substance Prescriptions [place] Controlled Substance Registry consulted? Yes, I have consulted the [place] Controlled Substances Registry for this patient, and feel the risk/benefit ratio today is favorable for proceeding with this prescription for a controlled  substance. Last active Rx was for Tramadol in Dec 2017. No other Rx since.    Sudie Grumbling, NP 10/16/17 2214

## 2017-10-16 NOTE — ED Triage Notes (Signed)
Patient is having recurrent severe lower left arm pain last PM. Patient does have a history of left arm pain.

## 2017-10-16 NOTE — Discharge Instructions (Addendum)
Recommend start Voltaren 75mg  twice a day as directed for pain. May take Flexeril 10mg  muscle relaxer- take 1/2 to 1 whole tablet every 8 hours as needed for muscle spasms. For severe pain, may take Tramadol 50mg  1 tablet every 6 to 8 hours as needed. Recommend apply warm compresses to area and may alternate with ice for comfort. Follow-up with your PCP in 3 days if not improving or go to the ER if pain worsens.

## 2017-10-19 DIAGNOSIS — M7090 Unspecified soft tissue disorder related to use, overuse and pressure of unspecified site: Secondary | ICD-10-CM | POA: Diagnosis not present

## 2017-10-24 ENCOUNTER — Ambulatory Visit: Admission: EM | Admit: 2017-10-24 | Discharge: 2017-10-24 | Discharge status: Absent without leave | Payer: 59

## 2017-10-27 DIAGNOSIS — I10 Essential (primary) hypertension: Secondary | ICD-10-CM | POA: Diagnosis not present

## 2017-10-27 DIAGNOSIS — E119 Type 2 diabetes mellitus without complications: Secondary | ICD-10-CM | POA: Diagnosis not present

## 2017-10-27 DIAGNOSIS — M7090 Unspecified soft tissue disorder related to use, overuse and pressure of unspecified site: Secondary | ICD-10-CM | POA: Diagnosis not present

## 2017-11-01 DIAGNOSIS — R7309 Other abnormal glucose: Secondary | ICD-10-CM | POA: Diagnosis not present

## 2017-11-01 DIAGNOSIS — E119 Type 2 diabetes mellitus without complications: Secondary | ICD-10-CM | POA: Diagnosis not present

## 2017-11-01 DIAGNOSIS — I10 Essential (primary) hypertension: Secondary | ICD-10-CM | POA: Diagnosis not present

## 2018-01-23 ENCOUNTER — Ambulatory Visit
Admission: EM | Admit: 2018-01-23 | Discharge: 2018-01-23 | Disposition: A | Discharge status: Home / Self Care | Payer: 59 | Attending: Family Medicine | Admitting: Family Medicine

## 2018-01-23 ENCOUNTER — Other Ambulatory Visit: Payer: Self-pay

## 2018-01-23 DIAGNOSIS — M25511 Pain in right shoulder: Secondary | ICD-10-CM | POA: Diagnosis not present

## 2018-01-23 MED ORDER — MELOXICAM 15 MG PO TABS: 15.0000 mg | ORAL_TABLET | Freq: Every day | ORAL | 0 refills | Status: DC | PRN

## 2018-01-23 NOTE — ED Triage Notes (Signed)
Patient complains of right arm pain, states that this started last week and has been constant. Patient denies any known injury to area.

## 2018-01-23 NOTE — ED Provider Notes (Signed)
[facility]    CSN: [id number] Arrival date & time: 01/23/18  1449  History   Chief Complaint Chief Complaint  Patient presents with  . Arm Pain    Right   HPI  52 year old female presents with right arm pain.  Patient reports her pain started last week.  Located on the right lateral shoulder.  Patient does not recall any recent trauma, fall, injury.  However, she has been lifting her granddaughter frequently.  This may be the culprit of her pain.  No medications or interventions tried.  Seems to be slightly worse with activity.  No relieving factors.  No other associated symptoms.  No other complaints/ concerns at this time.  Past Medical History:  Diagnosis Date  . Diabetes mellitus without complication (HCC)   . Hypertension    Past Surgical History:  Procedure Laterality Date  . FOOT SURGERY      OB History   None    Home Medications    Prior to Admission medications   Medication Sig Start Date End Date Taking? Authorizing Provider  GLIPIZIDE PO Take by mouth daily.   Yes [provider]  HYDRALAZINE-HCTZ PO Take by mouth daily.   Yes [provider]  LISINOPRIL PO Take by mouth daily.   Yes [provider]  METFORMIN HCL PO Take by mouth 2 (two) times daily.   Yes [provider]  meloxicam (MOBIC) 15 MG tablet Take 1 tablet (15 mg total) by mouth daily as needed. 01/23/18   Tommie Sams, DO   Family History Family History  Problem Relation Age of Onset  . Healthy Mother   . Diabetes Father    Social History Social History   Tobacco Use  . Smoking status: Never Smoker  . Smokeless tobacco: Never Used  Substance Use Topics  . Alcohol use: No    Comment: Occasionally  . Drug use: No   Allergies   Patient has no known allergies.  Review of Systems Review of Systems  Constitutional: Negative.   Musculoskeletal:       Right shoulder pain.   Physical Exam Triage Vital Signs ED Triage Vitals  Enc  Vitals Group     BP 01/23/18 1500 (!) 161/87     Pulse Rate 01/23/18 1500 75     Resp 01/23/18 1500 17     Temp 01/23/18 1500 98.3 F (36.8 C)     Temp Source 01/23/18 1500 Oral     SpO2 01/23/18 1500 98 %     Weight 01/23/18 1459 150 lb (68 kg)     Height 01/23/18 1459  (1.499 m)     Head Circumference --      Peak Flow --      Pain Score 01/23/18 1459 8     Pain Loc --      Pain Edu? --      Excl. in GC? --    Updated Vital Signs BP (!) 161/87 (BP Location: Left Arm)   Pulse 75   Temp 98.3 F (36.8 C) (Oral)   Resp 17   Ht  (1.499 m)   Wt 150 lb (68 kg)   SpO2 98%   BMI 30.30 kg/m   Physical Exam  Constitutional: She is oriented to person, place, and time. She appears well-developed. No distress.  Cardiovascular: Normal rate and regular rhythm.  Pulmonary/Chest: Effort normal and breath sounds normal.  Musculoskeletal:  Shoulder: Right Inspection reveals no abnormalities, atrophy or asymmetry. Palpation  is normal with no tenderness over AC joint or bicipital groove. Rotator cuff strength normal throughout. + Hawkins.   Neurological: She is alert and oriented to person, place, and time.  Psychiatric: She has a normal mood and affect. Her behavior is normal.  Nursing note and vitals reviewed.  UC Treatments / Results  Labs (all labs ordered are listed, but only abnormal results are displayed) Labs Reviewed - No data to display  EKG None  Radiology No results found.  Procedures Procedures (including critical care time)  Medications Ordered in UC Medications - No data to display  Initial Impression / Assessment and Plan / UC Course  I have reviewed the triage vital signs and the nursing notes.  Pertinent labs & imaging results that were available during my care of the patient were reviewed by me and considered in my medical decision making (see chart for details).    52 year old female presents with right shoulder pain.  Treating with  meloxicam.  Final Clinical Impressions(s) / UC Diagnoses   Final diagnoses:  Acute pain of right shoulder     Discharge Instructions     Rest.  Medication as prescribed.  Ice and heat as desired.  Take care  Dr. Adriana Simas    ED Prescriptions    Medication Sig Dispense Auth. Provider   meloxicam (MOBIC) 15 MG tablet Take 1 tablet (15 mg total) by mouth daily as needed. 30 tablet Tommie Sams, DO     Controlled Substance Prescriptions [place] Controlled Substance Registry consulted? Not Applicable   Tommie Sams, DO 01/23/18 1653

## 2018-01-23 NOTE — Discharge Instructions (Signed)
Rest.  Medication as prescribed.  Ice and heat as desired.  Take care  Dr. Adriana Simas

## 2018-02-22 ENCOUNTER — Other Ambulatory Visit: Payer: Self-pay | Admitting: Family Medicine

## 2018-06-18 ENCOUNTER — Other Ambulatory Visit: Payer: Self-pay | Admitting: Family Medicine

## 2018-06-18 ENCOUNTER — Ambulatory Visit
Admission: EM | Admit: 2018-06-18 | Discharge: 2018-06-18 | Disposition: A | Discharge status: Home / Self Care | Payer: 59 | Attending: Family Medicine | Admitting: Family Medicine

## 2018-06-18 ENCOUNTER — Other Ambulatory Visit: Payer: Self-pay

## 2018-06-18 DIAGNOSIS — L84 Corns and callosities: Secondary | ICD-10-CM

## 2018-06-18 DIAGNOSIS — Z1231 Encounter for screening mammogram for malignant neoplasm of breast: Secondary | ICD-10-CM

## 2018-06-18 NOTE — ED Provider Notes (Signed)
[facility]    CSN: [id number] Arrival date & time: 06/18/18  1728     History   Chief Complaint Chief Complaint  Patient presents with  . Foot Pain    HPI Ariana Jacobs is a 52 y.o. female.   52 yo female with a c/o pain and tenderness to "callus" on bottom of foot for the past week. States she walks a lot. Denies any injuries.   The history is provided by the patient.  Foot Pain     Past Medical History:  Diagnosis Date  . Diabetes mellitus without complication (HCC)   . Hypertension     There are no active problems to display for this patient.   Past Surgical History:  Procedure Laterality Date  . FOOT SURGERY      OB History   None      Home Medications    Prior to Admission medications   Medication Sig Start Date End Date Taking? Authorizing Provider  GLIPIZIDE PO Take by mouth daily.    [provider]  HYDRALAZINE-HCTZ PO Take by mouth daily.    [provider]  LISINOPRIL PO Take by mouth daily.    [provider]  meloxicam (MOBIC) 15 MG tablet Take 1 tablet (15 mg total) by mouth daily as needed. 01/23/18   Tommie Sams, DO  METFORMIN HCL PO Take by mouth 2 (two) times daily.    [provider]    Family History Family History  Problem Relation Age of Onset  . Healthy Mother   . Diabetes Father     Social History Social History   Tobacco Use  . Smoking status: Never Smoker  . Smokeless tobacco: Never Used  Substance Use Topics  . Alcohol use: Yes    Comment: Occasionally  . Drug use: No     Allergies   Patient has no known allergies.   Review of Systems Review of Systems   Physical Exam Triage Vital Signs ED Triage Vitals  Enc Vitals Group     BP 06/18/18 1735 (!) 165/81     Pulse Rate 06/18/18 1735 74     Resp 06/18/18 1735 16     Temp 06/18/18 1735 97.8 F (36.6 C)     Temp Source 06/18/18 1735 Oral     SpO2 06/18/18 1735 96 %     Weight 06/18/18 1737 153 lb  (69.4 kg)     Height 06/18/18 1737 4\' 11"  (1.499 m)     Head Circumference --      Peak Flow --      Pain Score 06/18/18 1736 7     Pain Loc --      Pain Edu? --      Excl. in GC? --    No data found.  Updated Vital Signs BP (!) 165/81 (BP Location: Right Arm)   Pulse 74   Temp 97.8 F (36.6 C) (Oral)   Resp 16   Ht 4\' 11"  (1.499 m)   Wt 69.4 kg   SpO2 96%   BMI 30.90 kg/m   Visual Acuity Right Eye Distance:   Left Eye Distance:   Bilateral Distance:    Right Eye Near:   Left Eye Near:    Bilateral Near:     Physical Exam  Constitutional: She appears well-developed and well-nourished. No distress.  Skin: She is not diaphoretic.  Right foot tender, 5mm hyerkeratotic solid skin growth  Nursing note and vitals reviewed.  UC Treatments / Results  Labs (all labs ordered are listed, but only abnormal results are displayed) Labs Reviewed - No data to display  EKG None  Radiology No results found.  Procedures Procedures (including critical care time)  Medications Ordered in UC Medications - No data to display  Initial Impression / Assessment and Plan / UC Course  I have reviewed the triage vital signs and the nursing notes.  Pertinent labs & imaging results that were available during my care of the patient were reviewed by me and considered in my medical decision making (see chart for details).      Final Clinical Impressions(s) / UC Diagnoses   Final diagnoses:  Corn or callus     Discharge Instructions     Follow up with podiatrist     ED Prescriptions    None     1. diagnosis reviewed with patient 2. Recommend using otc corn pads to relieve pressure, pumice stone and follow up with podiatrist    Controlled Substance Prescriptions [place] Controlled Substance Registry consulted? Not Applicable   Payton Mccallum, MD 06/18/18 [phone]

## 2018-06-18 NOTE — Discharge Instructions (Signed)
Follow up with podiatrist

## 2018-06-18 NOTE — ED Triage Notes (Signed)
Pt with callus on bottom of right foot. Pain 7/10

## 2018-06-19 DIAGNOSIS — E119 Type 2 diabetes mellitus without complications: Secondary | ICD-10-CM | POA: Diagnosis not present

## 2018-06-19 DIAGNOSIS — E1165 Type 2 diabetes mellitus with hyperglycemia: Secondary | ICD-10-CM | POA: Diagnosis not present

## 2018-06-19 DIAGNOSIS — B070 Plantar wart: Secondary | ICD-10-CM | POA: Diagnosis not present

## 2018-06-21 ENCOUNTER — Ambulatory Visit
Admission: RE | Admit: 2018-06-21 | Discharge: 2018-06-21 | Disposition: A | Discharge status: Home / Self Care | Payer: 59 | Source: Ambulatory Visit | Attending: Family Medicine | Admitting: Family Medicine

## 2018-06-21 DIAGNOSIS — Z1231 Encounter for screening mammogram for malignant neoplasm of breast: Secondary | ICD-10-CM | POA: Diagnosis not present

## 2018-09-19 ENCOUNTER — Ambulatory Visit (INDEPENDENT_AMBULATORY_CARE_PROVIDER_SITE_OTHER)
Admit: 2018-09-19 | Discharge: 2018-09-19 | Disposition: A | Discharge status: Home / Self Care | Payer: 59 | Attending: Family Medicine | Admitting: Family Medicine

## 2018-09-19 ENCOUNTER — Ambulatory Visit
Admission: EM | Admit: 2018-09-19 | Discharge: 2018-09-19 | Disposition: A | Discharge status: Home / Self Care | Payer: 59 | Attending: Family Medicine | Admitting: Family Medicine

## 2018-09-19 DIAGNOSIS — N281 Cyst of kidney, acquired: Secondary | ICD-10-CM

## 2018-09-19 DIAGNOSIS — R1032 Left lower quadrant pain: Secondary | ICD-10-CM

## 2018-09-19 DIAGNOSIS — E1165 Type 2 diabetes mellitus with hyperglycemia: Secondary | ICD-10-CM

## 2018-09-19 DIAGNOSIS — K429 Umbilical hernia without obstruction or gangrene: Secondary | ICD-10-CM | POA: Diagnosis not present

## 2018-09-19 DIAGNOSIS — N200 Calculus of kidney: Secondary | ICD-10-CM

## 2018-09-19 LAB — CBC WITH DIFFERENTIAL/PLATELET
Abs Immature Granulocytes: 0.01 10*3/uL (ref 0.00–0.07)
Basophils Absolute: 0 10*3/uL (ref 0.0–0.1)
Basophils Relative: 1 %
EOS PCT: 0 %
Eosinophils Absolute: 0 10*3/uL (ref 0.0–0.5)
HCT: 39.6 % (ref 36.0–46.0)
Hemoglobin: 13.6 g/dL (ref 12.0–15.0)
Immature Granulocytes: 0 %
Lymphocytes Relative: 44 %
Lymphs Abs: 2.7 10*3/uL (ref 0.7–4.0)
MCH: 31 pg (ref 26.0–34.0)
MCHC: 34.3 g/dL (ref 30.0–36.0)
MCV: 90.2 fL (ref 80.0–100.0)
Monocytes Absolute: 0.4 10*3/uL (ref 0.1–1.0)
Monocytes Relative: 7 %
Neutro Abs: 3 10*3/uL (ref 1.7–7.7)
Neutrophils Relative %: 48 %
Platelets: 307 10*3/uL (ref 150–400)
RBC: 4.39 MIL/uL (ref 3.87–5.11)
RDW: 11.9 % (ref 11.5–15.5)
WBC: 6.2 10*3/uL (ref 4.0–10.5)
nRBC: 0 % (ref 0.0–0.2)

## 2018-09-19 LAB — BASIC METABOLIC PANEL
Anion gap: 11 (ref 5–15)
BUN: 14 mg/dL (ref 6–20)
CO2: 29 mmol/L (ref 22–32)
CREATININE: 0.7 mg/dL (ref 0.44–1.00)
Calcium: 9.6 mg/dL (ref 8.9–10.3)
Chloride: 91 mmol/L — ABNORMAL LOW (ref 98–111)
GFR calc Af Amer: >60 mL/min (ref >60)
GFR calc non Af Amer: >60 mL/min (ref >60)
Glucose, Bld: 416 mg/dL — ABNORMAL HIGH (ref 70–99)
Potassium: 3.5 mmol/L (ref 3.5–5.1)
Sodium: 131 mmol/L — ABNORMAL LOW (ref 135–145)

## 2018-09-19 LAB — URINALYSIS, COMPLETE (UACMP) WITH MICROSCOPIC
BILIRUBIN URINE: NEGATIVE
Bacteria, UA: NONE SEEN
Glucose, UA: >1000 mg/dL — AB
Hgb urine dipstick: NEGATIVE
Ketones, ur: NEGATIVE mg/dL
Leukocytes, UA: NEGATIVE
NITRITE: NEGATIVE
Protein, ur: NEGATIVE mg/dL
RBC / HPF: NONE SEEN RBC/hpf (ref 0–5)
SPECIFIC GRAVITY, URINE: 1.01 (ref 1.005–1.030)
WBC, UA: NONE SEEN WBC/hpf (ref 0–5)
pH: 6 (ref 5.0–8.0)

## 2018-09-19 MED ORDER — IOHEXOL 300 MG/ML  SOLN
100.0000 mL | Freq: Once | INTRAMUSCULAR | Status: AC | PRN
  Administered 2018-09-19: 100 mL via INTRAVENOUS

## 2018-09-19 NOTE — ED Triage Notes (Signed)
Pt here for abdominal pain since yesterday in her LLQ and states she is having dysuria and flank pain as well. Also having right hand palm pain. No otc meds tried and no fever reported.

## 2018-09-19 NOTE — ED Provider Notes (Signed)
[facility] ____________________________________________  Time seen: Approximately 7:24 PM  I have reviewed the triage vital signs and the nursing notes.   HISTORY  Chief Complaint Abdominal Pain and Hand Pain   HPI Ariana Jacobs is a 53 y.o. female past medical history of hypertension and diabetes presenting for evaluation of left lower quadrant abdominal pain since yesterday.  Reports pain has been persistent, but intermittently increases in intensity.  States currently pain is mild, earlier was moderate.  States pain stays in left lower quadrant without radiation.  Denies injury or trauma.  Denies nausea, vomiting, diarrhea or constipation.  Continues with normal bowel habits.  Denies abnormal foods eaten recently.  Denies history of similar.  No previous colonoscopy.  Denies abnormal colored stool or blood in stool.  States has had some intermittent burning with urination, but states that is improving after she did over-the-counter Monistat treatment.  Denies any current vaginal discharge or vaginal irritation.  Did notice some increased pain with leg lifting and movement.  Denies other aggravating alleviating factors.  Denies any other abdominal pain.  Denies current back pain.  Denies chest pain, shortness of breath or fevers.  Alba CorySowles, Krichna, MD: PCP   Past Medical History:  Diagnosis Date  . Diabetes mellitus without complication (HCC)   . Hypertension     There are no active problems to display for this patient.   Past Surgical History:  Procedure Laterality Date  . FOOT SURGERY       No current facility-administered medications for this encounter.   Current Outpatient Medications:  .  GLIPIZIDE PO, Take by mouth daily., Disp: , Rfl:  .  HYDRALAZINE-HCTZ PO, Take by mouth daily., Disp: , Rfl:  .  LISINOPRIL PO, Take by mouth daily., Disp: , Rfl:  .  METFORMIN HCL PO, Take by mouth 2 (two) times daily., Disp: , Rfl:  .  meloxicam (MOBIC) 15 MG  tablet, Take 1 tablet (15 mg total) by mouth daily as needed., Disp: 30 tablet, Rfl: 0 Metformin 1000 mg bid Unsure glipizide dose  Allergies Patient has no known allergies.  Family History  Problem Relation Age of Onset  . Healthy Mother   . Diabetes Father   . Breast cancer Neg Hx     Social History Social History   Tobacco Use  . Smoking status: Never Smoker  . Smokeless tobacco: Never Used  Substance Use Topics  . Alcohol use: Yes    Comment: Occasionally  . Drug use: No    Review of Systems Constitutional: No fever Cardiovascular: Denies chest pain. Respiratory: Denies shortness of breath. Gastrointestinal: Positive for abdominal pain. Genitourinary: positive for dysuria. Musculoskeletal: Negative for back pain. Skin: Negative for rash.   ____________________________________________   PHYSICAL EXAM:  VITAL SIGNS: ED Triage Vitals  Enc Vitals Group     BP 09/19/18 1826 (!) 145/89     Pulse Rate 09/19/18 1826 83     Resp 09/19/18 1826 18     Temp 09/19/18 1826 97.9 F (36.6 C)     Temp Source 09/19/18 1826 Oral     SpO2 09/19/18 1826 97 %     Weight 09/19/18 1828 150 lb (68 kg)     Height 09/19/18 1828 4\' 11"  (1.499 m)     Head Circumference --      Peak Flow --      Pain Score 09/19/18 1828 8     Pain Loc --      Pain Edu? --  Excl. in GC? --     Constitutional: Alert and oriented. Well appearing and in no acute distress. ENT      Head: Normocephalic and atraumatic. Cardiovascular: Normal rate, regular rhythm. Grossly normal heart sounds.  Good peripheral circulation. Respiratory: Normal respiratory effort without tachypnea nor retractions. Breath sounds are clear and equal bilaterally. No wheezes, rales, rhonchi. Gastrointestinal:  Normal Bowel sounds.  Mild to moderate left lower quadrant abdominal tenderness.  Minimal left upper quadrant abdominal tenderness.  Abdomen otherwise soft and nontender.  Non-guarding.  No CVA  tenderness. Musculoskeletal: No midline cervical, thoracic or lumbar tenderness to palpation.  Neurologic:  Normal speech and language. Speech is normal. No gait instability.  Skin:  Skin is warm, dry and intact. No rash noted. Psychiatric: Mood and affect are normal. Speech and behavior are normal. Patient exhibits appropriate insight and judgment   ___________________________________________   LABS (all labs ordered are listed, but only abnormal results are displayed)  Labs Reviewed  URINALYSIS, COMPLETE (UACMP) WITH MICROSCOPIC - Abnormal; Notable for the following components:      Result Value   Color, Urine STRAW (*)    Glucose, UA >1000 (*)    All other components within normal limits  BASIC METABOLIC PANEL - Abnormal; Notable for the following components:   Sodium 131 (*)    Chloride 91 (*)    Glucose, Bld 416 (*)    All other components within normal limits  CBC WITH DIFFERENTIAL/PLATELET    RADIOLOGY  Ct Abdomen Pelvis W Contrast  Result Date: 09/19/2018 CLINICAL DATA:  LEFT lower quadrant abdominal pain since yesterday, dysuria, flank pain, no fever; history diabetes mellitus, hypertension EXAM: CT ABDOMEN AND PELVIS WITH CONTRAST TECHNIQUE: Multidetector CT imaging of the abdomen and pelvis was performed using the standard protocol following bolus administration of intravenous contrast. Sagittal and coronal MPR images reconstructed from axial data set. CONTRAST:  OMNIPAQUE IOHEXOL 300 MG/ML SOLN IV. No oral contrast. COMPARISON:  None FINDINGS: Lower chest: Minimal atelectasis or scarring at medial RIGHT lung base. Hepatobiliary: Minimal fatty infiltration of liver. Gallbladder unremarkable. No biliary dilatation. Pancreas: Normal appearance Spleen: Normal appearance Adrenals/Urinary Tract: Adrenal glands normal appearance. BILATERAL small renal cysts. Tiny nonobstructing calculus at inferior pole RIGHT kidney. No additional renal mass or hydronephrosis. Unremarkable  ureters and bladder. Stomach/Bowel: Questionable visualization of a short appendiceal or stump. Stomach and bowel loops otherwise normal appearance. Vascular/Lymphatic: Vascular structures patent. Aorta normal caliber. No adenopathy. Reproductive: Unremarkable uterus and adnexa Other: No free air free fluid. Small umbilical hernia containing fat. No acute inflammatory process. Musculoskeletal: No significant osseous abnormalities. Question LEFT paracentral disc herniation L5-S1 extending into medial aspect of neural foramen. IMPRESSION: BILATERAL renal cysts and tiny nonobstructing RIGHT renal calculus. Small umbilical hernia containing fat. No definite acute intra-abdominal or intrapelvic abnormalities. Electronically Signed   By: Ulyses Southward M.D.   On: 09/19/2018 20:18   ____________________________________________   PROCEDURES Procedures   INITIAL IMPRESSION / ASSESSMENT AND PLAN / ED COURSE  Pertinent labs & imaging results that were available during my care of the patient were reviewed by me and considered in my medical decision making (see chart for details).  Overall well-appearing patient.  No acute distress.  Left lower quadrant abdominal pain.  Urinalysis reviewed, unremarkable except for large amount of glucose.  Patient states that she has been taking her diabetic medication  but not as prescribed "for a while" and does not monitor sugar.  Discussed multiple differentials with patient including UTI, nephrolithiasis, diverticulitis,  muscle strain, viral.  Will evaluate labs as well as CT abdomen.  Patient labs reviewed and discussed with patient.  Patient reports abdominal pain is much improved currently.  Patient with uncontrolled diabetes with elevated blood sugar at 416.  Patient known diabetic, and reports not taking her metformin and glipizide as directed.  Recommend for patient to call first thing tomorrow morning to schedule follow-up in the next 2 days with her primary care  provider.  2030: Patient reevaluated and reports pain now fully resolved and feeling better.  CT abdomen reviewed, CT results as above per radiologist.  CT with no definite acute or intra-abdominal or intrapelvic abnormalities, multiple incidental findings noted including the above renal cysts, right renal calculus, umbilical hernia as well as possible disc herniated.  Discussed all findings with patient, reviewed and given CT result copy.  Again counseled regarding uncontrolled blood sugar and patient not taking her medication as prescribed.  Patient states for the last 2 days she is taking them as prescribed and will continue to do so as well as will follow up with her primary care doctor tomorrow to have blood sugar rechecked and reevaluated.  Discussed for worsening concerns proceed directly to the emergency room.   Discussed follow up and return parameters including no resolution or any worsening concerns. Patient verbalized understanding and agreed to plan.   ____________________________________________   FINAL CLINICAL IMPRESSION(S) / ED DIAGNOSES  Final diagnoses:  Left lower quadrant abdominal pain  Uncontrolled type 2 diabetes mellitus with hyperglycemia [facility])     ED Discharge Orders    None       Note: This dictation was prepared with Dragon dictation along with smaller phrase technology. Any transcriptional errors that result from this process are unintentional.         Renford Dills, NP 09/19/18 2047

## 2018-09-19 NOTE — Discharge Instructions (Addendum)
You need to to have very close follow-up with your primary doctor at [facility].  As discussed you need to be seen tomorrow and have your blood sugar rechecked.  It is very important that you take your blood pressure and diabetes medications as prescribed.  Drink water, decrease carbs.  Continue to monitor abdominal discomfort.  Follow-up with primary care tomorrow.  Proceed directly to the emergency room for any increased pain or worsening concerns.

## 2018-10-30 ENCOUNTER — Other Ambulatory Visit: Payer: Self-pay

## 2018-10-30 ENCOUNTER — Ambulatory Visit (INDEPENDENT_AMBULATORY_CARE_PROVIDER_SITE_OTHER): Payer: 59

## 2018-10-30 ENCOUNTER — Ambulatory Visit
Admission: EM | Admit: 2018-10-30 | Discharge: 2018-10-30 | Disposition: A | Discharge status: Home / Self Care | Payer: 59 | Attending: Family Medicine | Admitting: Family Medicine

## 2018-10-30 ENCOUNTER — Encounter: Payer: Self-pay | Admitting: Emergency Medicine

## 2018-10-30 DIAGNOSIS — K5900 Constipation, unspecified: Secondary | ICD-10-CM

## 2018-10-30 DIAGNOSIS — I10 Essential (primary) hypertension: Secondary | ICD-10-CM | POA: Diagnosis not present

## 2018-10-30 LAB — CBC WITH DIFFERENTIAL/PLATELET
Abs Immature Granulocytes: 0.01 10*3/uL (ref 0.00–0.07)
Basophils Absolute: 0 10*3/uL (ref 0.0–0.1)
Basophils Relative: 1 %
Eosinophils Absolute: 0.1 10*3/uL (ref 0.0–0.5)
Eosinophils Relative: 1 %
HCT: 35.9 % — ABNORMAL LOW (ref 36.0–46.0)
Hemoglobin: 12 g/dL (ref 12.0–15.0)
Immature Granulocytes: 0 %
Lymphocytes Relative: 39 %
Lymphs Abs: 2.3 10*3/uL (ref 0.7–4.0)
MCH: 30.8 pg (ref 26.0–34.0)
MCHC: 33.4 g/dL (ref 30.0–36.0)
MCV: 92.1 fL (ref 80.0–100.0)
Monocytes Absolute: 0.5 10*3/uL (ref 0.1–1.0)
Monocytes Relative: 8 %
Neutro Abs: 3 10*3/uL (ref 1.7–7.7)
Neutrophils Relative %: 51 %
Platelets: 311 10*3/uL (ref 150–400)
RBC: 3.9 MIL/uL (ref 3.87–5.11)
RDW: 12.2 % (ref 11.5–15.5)
WBC: 5.9 10*3/uL (ref 4.0–10.5)
nRBC: 0 % (ref 0.0–0.2)

## 2018-10-30 LAB — URINALYSIS, COMPLETE (UACMP) WITH MICROSCOPIC
Bacteria, UA: NONE SEEN
Bilirubin Urine: NEGATIVE
Glucose, UA: 500 mg/dL — AB
Hgb urine dipstick: NEGATIVE
Ketones, ur: NEGATIVE mg/dL
Leukocytes,Ua: NEGATIVE
Nitrite: NEGATIVE
Protein, ur: NEGATIVE mg/dL
RBC / HPF: NONE SEEN RBC/hpf (ref 0–5)
Specific Gravity, Urine: 1.02 (ref 1.005–1.030)
WBC, UA: NONE SEEN WBC/hpf (ref 0–5)
pH: 7 (ref 5.0–8.0)

## 2018-10-30 NOTE — Discharge Instructions (Signed)
Drink Mag citrate tonight for your constipation.  Follow directions on the bottle.  Been using psyllium (Metamucil type ) 2 times daily to help prevent future episodes of constipation.  Also may use stool softeners  such as Colace 2/day to help ease the constipation.

## 2018-10-30 NOTE — ED Provider Notes (Signed)
[facility]    CSN: [id number] Arrival date & time: 10/30/18  1813     History   Chief Complaint Chief Complaint  Patient presents with  . Abdominal Pain    HPI Ariana Jacobs is a 53 y.o. female.   HPI  54 year old female presents with left lower quadrant pain that started yesterday.  She has had no nausea or vomiting.  She said her last bowel movement was yesterday which she states was normal but she had to strain more than usual to have the movement.  Does have a history of constipation.  She denies any fever or chills.  She is not hungry.  Her last meal was about 1:00 this afternoon.  CT abdomen on 09/19/2018 showed BILATERAL renal cysts and tiny nonobstructing RIGHT renal calculus. Small umbilical hernia containing fat. No definite acute intra-abdominal or intrapelvic abnormalities.  At that time she had complaint of left lower quadrant pain.        Past Medical History:  Diagnosis Date  . Diabetes mellitus without complication (HCC)   . Hypertension     There are no active problems to display for this patient.   Past Surgical History:  Procedure Laterality Date  . FOOT SURGERY      OB History   No obstetric history on file.      Home Medications    Prior to Admission medications   Medication Sig Start Date End Date Taking? Authorizing Provider  GLIPIZIDE PO Take by mouth daily.   Yes [provider]  HYDRALAZINE-HCTZ PO Take by mouth daily.   Yes [provider]  LISINOPRIL PO Take by mouth daily.   Yes [provider]  METFORMIN HCL PO Take by mouth 2 (two) times daily.   Yes [provider]    Family History Family History  Problem Relation Age of Onset  . Healthy Mother   . Diabetes Father   . Breast cancer Neg Hx     Social History Social History   Tobacco Use  . Smoking status: Never Smoker  . Smokeless tobacco: Never Used  Substance Use Topics  . Alcohol use: Yes    Comment:  Occasionally  . Drug use: No     Allergies   Patient has no known allergies.   Review of Systems Review of Systems  Constitutional: Positive for activity change and appetite change. Negative for chills, diaphoresis, fatigue and fever.  Gastrointestinal: Positive for abdominal distention, abdominal pain, constipation, nausea and vomiting.  Genitourinary: Negative for dysuria, frequency and urgency.  All other systems reviewed and are negative.    Physical Exam Triage Vital Signs ED Triage Vitals  Enc Vitals Group     BP 10/30/18 1834 (!) 161/83     Pulse Rate 10/30/18 1834 83     Resp 10/30/18 1834 14     Temp 10/30/18 1834 98.2 F (36.8 C)     Temp Source 10/30/18 1834 Oral     SpO2 10/30/18 1834 100 %     Weight 10/30/18 1831 145 lb (65.8 kg)     Height 10/30/18 1831  (1.499 m)     Head Circumference --      Peak Flow --      Pain Score 10/30/18 1831 6     Pain Loc --      Pain Edu? --      Excl. in GC? --    No data found.  Updated Vital Signs BP (!) 161/83 (BP  Location: Left Arm) Comment: Patient states that she has not taken her BP medicine today  Pulse 83   Temp 98.2 F (36.8 C) (Oral)   Resp 14   Ht 4\' 11"  (1.499 m)   Wt 145 lb (65.8 kg)   SpO2 100%   BMI 29.29 kg/m   Visual Acuity Right Eye Distance:   Left Eye Distance:   Bilateral Distance:    Right Eye Near:   Left Eye Near:    Bilateral Near:     Physical Exam Vitals signs and nursing note reviewed.  Constitutional:      General: She is not in acute distress.    Appearance: She is well-developed. She is obese. She is not ill-appearing, toxic-appearing or diaphoretic.  HENT:     Head: Normocephalic and atraumatic.     Mouth/Throat:     Mouth: Mucous membranes are moist.     Pharynx: Oropharynx is clear.  Pulmonary:     Effort: Pulmonary effort is normal.     Breath sounds: Normal breath sounds.  Abdominal:     General: Bowel sounds are normal.     Tenderness: There is  abdominal tenderness in the left lower quadrant. There is no right CVA tenderness, left CVA tenderness, guarding or rebound.  Skin:    General: Skin is warm and dry.  Neurological:     General: No focal deficit present.     Mental Status: She is alert.  Psychiatric:        Mood and Affect: Mood normal.        Behavior: Behavior normal.      UC Treatments / Results  Labs (all labs ordered are listed, but only abnormal results are displayed) Labs Reviewed  URINALYSIS, COMPLETE (UACMP) WITH MICROSCOPIC - Abnormal; Notable for the following components:      Result Value   Color, Urine STRAW (*)    Glucose, UA 500 (*)    All other components within normal limits  CBC WITH DIFFERENTIAL/PLATELET - Abnormal; Notable for the following components:   HCT 35.9 (*)    All other components within normal limits    EKG None  Radiology Dg Abd 2 Views  Result Date: 10/30/2018 CLINICAL DATA:  Left side abdominal pain since yesterday. EXAM: ABDOMEN - 2 VIEW COMPARISON:  CT abdomen and pelvis 09/19/2018. FINDINGS: The bowel gas pattern is normal. There is no evidence of free air. Moderately large stool burden is seen throughout the colon. No radio-opaque calculi or other significant radiographic abnormality is seen. IMPRESSION: No acute finding. Moderately large stool burden throughout the colon. Electronically Signed   By: Drusilla Kanner M.D.   On: 10/30/2018 19:18    Procedures Procedures (including critical care time)  Medications Ordered in UC Medications - No data to display  Initial Impression / Assessment and Plan / UC Course  I have reviewed the triage vital signs and the nursing notes.  Pertinent labs & imaging results that were available during my care of the patient were reviewed by me and considered in my medical decision making (see chart for details).   Reviewed the laboratory findings as well as x-rays with the patient.  Constipated with a high burden of stool.  Have her  drink mag citrate tonight for bowel movement and begin using Metamucil twice daily from now on.  Diet does not seem to include many fruits and vegetables.  May also consider using a Colace 2 times daily.  If she is not improving she should  follow-up with her primary care   Final Clinical Impressions(s) / UC Diagnoses   Final diagnoses:  Constipation, unspecified constipation type     Discharge Instructions      Drink Mag citrate tonight for your constipation.  Follow directions on the bottle.  Been using psyllium (Metamucil type ) 2 times daily to help prevent future episodes of constipation.  Also may use stool softeners  such as Colace 2/day to help ease the constipation.    ED Prescriptions    None     Controlled Substance Prescriptions [place] Controlled Substance Registry consulted? Not Applicable   Lutricia Feil, PA-C 10/30/18 1936

## 2018-10-30 NOTE — ED Triage Notes (Signed)
Patient c/o left mid abdominal pain that started yesterday.  Patient denies N/V.  Patient states that her last bowel movement was yesterday and she did have to strain to have a bowel movement. Patient history of constipation.

## 2019-01-30 ENCOUNTER — Other Ambulatory Visit: Payer: Self-pay

## 2019-01-30 ENCOUNTER — Encounter: Payer: Self-pay | Admitting: Emergency Medicine

## 2019-01-30 ENCOUNTER — Ambulatory Visit
Admission: EM | Admit: 2019-01-30 | Discharge: 2019-01-30 | Disposition: A | Discharge status: Home / Self Care | Payer: 59 | Attending: Emergency Medicine | Admitting: Emergency Medicine

## 2019-01-30 DIAGNOSIS — R1032 Left lower quadrant pain: Secondary | ICD-10-CM | POA: Diagnosis not present

## 2019-01-30 DIAGNOSIS — K5900 Constipation, unspecified: Secondary | ICD-10-CM

## 2019-01-30 DIAGNOSIS — N760 Acute vaginitis: Secondary | ICD-10-CM

## 2019-01-30 LAB — URINALYSIS, COMPLETE (UACMP) WITH MICROSCOPIC
Bilirubin Urine: NEGATIVE
Glucose, UA: 500 mg/dL — AB
Hgb urine dipstick: NEGATIVE
Ketones, ur: NEGATIVE mg/dL
Leukocytes,Ua: NEGATIVE
Nitrite: NEGATIVE
Protein, ur: NEGATIVE mg/dL
Specific Gravity, Urine: 1.025 (ref 1.005–1.030)
pH: 7 (ref 5.0–8.0)

## 2019-01-30 MED ORDER — FLUCONAZOLE 150 MG PO TABS: 150.0000 mg | ORAL_TABLET | Freq: Every day | ORAL | 0 refills | Status: DC

## 2019-01-30 NOTE — ED Provider Notes (Addendum)
[facility] ____________________________________________  Time seen: Approximately 3:53 PM  I have reviewed the triage vital signs and the nursing notes.   HISTORY  Chief Complaint Abdominal Pain and Headache   HPI Ariana Jacobs is a 53 y.o. female history of hypertension, diabetes and constipation presenting for evaluation of lower abdominal discomfort since yesterday.  Patient states this is something that she has every so often and references constipation.  Patient reports she generally has some left lower abdominal discomfort when she is constipated.  States last bowel movement was this morning and describes it as normal with minimal straining.  Denies abnormal colored stools, no black or bloody stools.  Patient states that she generally takes Metamucil, but has not been taking it as regularly as she normally does, and states she knows she needs to correct this.  Continues to pass gas normally.  Continues to eat and drink well.  Reports she was supposed to work today, but felt she needed a day off and she needed a work note.  Patient further states she occasionally has some vaginal itching and burning with urination intermittently, not persistently, no burning with urination today, but some vaginal itching.  Denies vaginal pain or discharge.  States feels like previous vaginal yeast infections.  Reports she had a headache yesterday, but none now that it has resolved.  Denies cough, congestion, chest pain, shortness of breath or fevers.  Alba Cory, MD: PCP next appointment next week.   Past Medical History:  Diagnosis Date  . Diabetes mellitus without complication (HCC)   . Hypertension     There are no active problems to display for this patient.   Past Surgical History:  Procedure Laterality Date  . FOOT SURGERY       No current facility-administered medications for this encounter.   Current Outpatient Medications:  .  GLIPIZIDE PO, Take by mouth  daily., Disp: , Rfl:  .  HYDRALAZINE-HCTZ PO, Take by mouth daily., Disp: , Rfl:  .  LISINOPRIL PO, Take by mouth daily., Disp: , Rfl:  .  METFORMIN HCL PO, Take by mouth 2 (two) times daily., Disp: , Rfl:  .  fluconazole (DIFLUCAN) 150 MG tablet, Take 1 tablet (150 mg total) by mouth daily. Take one pill orally once, Disp: 1 tablet, Rfl: 0  Allergies Patient has no known allergies.  Family History  Problem Relation Age of Onset  . Healthy Mother   . Diabetes Father   . Breast cancer Neg Hx     Social History Social History   Tobacco Use  . Smoking status: Never Smoker  . Smokeless tobacco: Never Used  Substance Use Topics  . Alcohol use: Yes    Comment: Occasionally  . Drug use: No    Review of Systems Constitutional: No fever ENT: No sore throat. Cardiovascular: Denies chest pain. Respiratory: Denies shortness of breath. Gastrointestinal: As above.  No nausea, no vomiting.  No diarrhea.   Genitourinary:as above.  Musculoskeletal: Negative for back pain. Skin: Negative for rash. Neurological: Negative for headaches, focal weakness or numbness.    ____________________________________________   PHYSICAL EXAM:  VITAL SIGNS: ED Triage Vitals  Enc Vitals Group     BP 01/30/19 1519 (!) 176/93     Pulse Rate 01/30/19 1519 71     Resp 01/30/19 1519 16     Temp 01/30/19 1519 98.1 F (36.7 C)     Temp Source 01/30/19 1519 Oral     SpO2 01/30/19 1519 99 %  Weight 01/30/19 1517 160 lb (72.6 kg)     Height 01/30/19 1517 4\' 11"  (1.499 m)     Head Circumference --      Peak Flow --      Pain Score 01/30/19 1516 8     Pain Loc --      Pain Edu? --      Excl. in GC? --    Vitals:   01/30/19 1517 01/30/19 1519 01/30/19 1553  BP:  (!) 176/93 (!) 158/98  Pulse:  71   Resp:  16   Temp:  98.1 F (36.7 C)   TempSrc:  Oral   SpO2:  99%   Weight: 160 lb (72.6 kg)    Height: 4\' 11"  (1.499 m)       Constitutional: Alert and oriented. Well appearing and in no  acute distress. ENT      Head: Normocephalic and atraumatic. Cardiovascular: Normal rate, regular rhythm. Grossly normal heart sounds.  Good peripheral circulation. Respiratory: Normal respiratory effort without tachypnea nor retractions. Breath sounds are clear and equal bilaterally. No wheezes, rales, rhonchi. Gastrointestinal:  No distention.  Minimal left lower abdominal tenderness palpation.  Abdomen otherwise soft and nontender.  Non-guarding.  Normal Bowel sounds. No CVA tenderness. Musculoskeletal:  No midline cervical, thoracic or lumbar tenderness to palpation.  Neurologic:  Normal speech and language. Speech is normal. No gait instability.  Skin:  Skin is warm, dry and intact. No rash noted. Psychiatric: Mood and affect are normal. Speech and behavior are normal. Patient exhibits appropriate insight and judgment   ___________________________________________   LABS (all labs ordered are listed, but only abnormal results are displayed)  Labs Reviewed  URINALYSIS, COMPLETE (UACMP) WITH MICROSCOPIC - Abnormal; Notable for the following components:      Result Value   Glucose, UA 500 (*)    Bacteria, UA RARE (*)    All other components within normal limits  URINE CULTURE    PROCEDURES Procedures    INITIAL IMPRESSION / ASSESSMENT AND PLAN / ED COURSE  Pertinent labs & imaging results that were available during my care of the patient were reviewed by me and considered in my medical decision making (see chart for details).  Well-appearing patient.  No acute distress.  Patient states she is currently feeling better.  Request work note, note given.  Patient exam overall well-appearing and suspect related to constipation and recommend for resuming Metamucil.  Will defer imaging at this time, patient agrees to this and states she does not want to have any imaging.  Discussed close monitoring.  Patient urinalysis reviewed, yeast was noted and patient does report some vaginal itching,  suspect yeast vaginitis Diflucan given.  Directed for patient to monitor, take medications as prescribed and follow-up with her primary care next week as scheduled.  Further recommend for patient to follow-up with GI as she is not previously had a colonoscopy and reports recurrent constipation.Discussed indication, risks and benefits of medications with patient.  Discussed follow up with Primary care physician this week. Discussed follow up and return parameters including no resolution or any worsening concerns. Patient verbalized understanding and agreed to plan.   ____________________________________________   FINAL CLINICAL IMPRESSION(S) / ED DIAGNOSES  Final diagnoses:  Left lower quadrant abdominal pain  Constipation, unspecified constipation type  Acute vaginitis     ED Discharge Orders         Ordered    fluconazole (DIFLUCAN) 150 MG tablet  Daily     01/30/19 1546  Note: This dictation was prepared with Dragon dictation along with smaller phrase technology. Any transcriptional errors that result from this process are unintentional.         Renford DillsMiller, Pennelope Basque, NP 01/30/19 1603    Renford DillsMiller, Taccara Bushnell, NP 01/30/19 [phone]

## 2019-01-30 NOTE — Discharge Instructions (Addendum)
Take medication as prescribed. Take metamucil as discussed. Drink plenty of water. Monitor.   Follow up with your primary care physician this week as scheduled.  Recommend for you to follow-up with gastroenterology for colonoscopy.   Return to Urgent care for new or worsening concerns.

## 2019-01-30 NOTE — ED Triage Notes (Signed)
Patient c/o abdominal pain and HAs that started this morning.  Patient denies N/V.  Patient reports some loose stools last night.

## 2019-01-31 LAB — URINE CULTURE

## 2019-02-20 ENCOUNTER — Encounter: Payer: Self-pay | Admitting: Emergency Medicine

## 2019-02-20 ENCOUNTER — Other Ambulatory Visit: Payer: Self-pay

## 2019-02-20 ENCOUNTER — Emergency Department
Admission: EM | Admit: 2019-02-20 | Discharge: 2019-02-20 | Disposition: A | Discharge status: Home / Self Care | Payer: 59 | Attending: Emergency Medicine | Admitting: Emergency Medicine

## 2019-02-20 DIAGNOSIS — Z5321 Procedure and treatment not carried out due to patient leaving prior to being seen by health care provider: Secondary | ICD-10-CM | POA: Insufficient documentation

## 2019-02-20 DIAGNOSIS — M545 Low back pain: Secondary | ICD-10-CM | POA: Insufficient documentation

## 2019-02-20 DIAGNOSIS — R197 Diarrhea, unspecified: Secondary | ICD-10-CM | POA: Diagnosis not present

## 2019-02-20 LAB — COMPREHENSIVE METABOLIC PANEL
ALT: 16 U/L (ref 0–44)
AST: 16 U/L (ref 15–41)
Albumin: 4.3 g/dL (ref 3.5–5.0)
Alkaline Phosphatase: 98 U/L (ref 38–126)
Anion gap: 14 (ref 5–15)
BUN: 23 mg/dL — ABNORMAL HIGH (ref 6–20)
CO2: 28 mmol/L (ref 22–32)
Calcium: 9.8 mg/dL (ref 8.9–10.3)
Chloride: 96 mmol/L — ABNORMAL LOW (ref 98–111)
Creatinine, Ser: 0.89 mg/dL (ref 0.44–1.00)
GFR calc Af Amer: >60 mL/min (ref >60)
GFR calc non Af Amer: >60 mL/min (ref >60)
Glucose, Bld: 362 mg/dL — ABNORMAL HIGH (ref 70–99)
Potassium: 3.4 mmol/L — ABNORMAL LOW (ref 3.5–5.1)
Sodium: 138 mmol/L (ref 135–145)
Total Bilirubin: 0.2 mg/dL — ABNORMAL LOW (ref 0.3–1.2)
Total Protein: 8.1 g/dL (ref 6.5–8.1)

## 2019-02-20 LAB — CBC
HCT: 35.9 % — ABNORMAL LOW (ref 36.0–46.0)
Hemoglobin: 12 g/dL (ref 12.0–15.0)
MCH: 30.2 pg (ref 26.0–34.0)
MCHC: 33.4 g/dL (ref 30.0–36.0)
MCV: 90.2 fL (ref 80.0–100.0)
Platelets: 303 10*3/uL (ref 150–400)
RBC: 3.98 MIL/uL (ref 3.87–5.11)
RDW: 12.3 % (ref 11.5–15.5)
WBC: 6.8 10*3/uL (ref 4.0–10.5)
nRBC: 0 % (ref 0.0–0.2)

## 2019-02-20 LAB — LIPASE, BLOOD: Lipase: 32 U/L (ref 11–51)

## 2019-02-20 MED ORDER — SODIUM CHLORIDE 0.9% FLUSH: 3.0000 mL | Freq: Once | INTRAVENOUS | Status: DC

## 2019-02-20 NOTE — ED Triage Notes (Signed)
C/O right mid - low back pain  X 1 day.  Denies injury.   Also c/o diarrhea today.

## 2019-02-21 ENCOUNTER — Encounter: Payer: Self-pay | Admitting: Emergency Medicine

## 2019-02-21 ENCOUNTER — Other Ambulatory Visit: Payer: Self-pay

## 2019-02-21 ENCOUNTER — Ambulatory Visit
Admission: EM | Admit: 2019-02-21 | Discharge: 2019-02-21 | Disposition: A | Discharge status: Home / Self Care | Payer: 59 | Attending: Family Medicine | Admitting: Family Medicine

## 2019-02-21 DIAGNOSIS — M545 Low back pain, unspecified: Secondary | ICD-10-CM

## 2019-02-21 LAB — URINALYSIS, COMPLETE (UACMP) WITH MICROSCOPIC
Bilirubin Urine: NEGATIVE
Glucose, UA: 100 mg/dL — AB
Hgb urine dipstick: NEGATIVE
Ketones, ur: NEGATIVE mg/dL
Leukocytes,Ua: NEGATIVE
Nitrite: NEGATIVE
Protein, ur: 30 mg/dL — AB
Specific Gravity, Urine: 1.015 (ref 1.005–1.030)
pH: 8.5 — ABNORMAL HIGH (ref 5.0–8.0)

## 2019-02-21 MED ORDER — TIZANIDINE HCL 4 MG PO TABS: 4.0000 mg | ORAL_TABLET | Freq: Three times a day (TID) | ORAL | 0 refills | Status: DC | PRN

## 2019-02-21 MED ORDER — MELOXICAM 15 MG PO TABS: 15.0000 mg | ORAL_TABLET | Freq: Every day | ORAL | 0 refills | Status: DC | PRN

## 2019-02-21 NOTE — Discharge Instructions (Signed)
Rest.  Ice and Heat.  Medication as prescribed.  Take care  Dr. Merel Santoli  

## 2019-02-21 NOTE — ED Provider Notes (Signed)
[facility]    CSN: [id number] Arrival date & time: 02/21/19  0913   History   Chief Complaint Chief Complaint  Patient presents with  . Back Pain   HPI   53 year old female presents with back pain.  Started yesterday.  Location: Left low back.  No recent fall, trauma, injury.  Worse with standing up, sitting, and certain activities.  She took [facility] powder yesterday with improvement.  No urinary symptoms.  No reports of radicular symptoms.  Pain is currently 8/10 in severity.  No other associated symptoms.  No other complaints.  PMH, Surgical Hx, Family Hx, Social History reviewed and updated as below.  Past Medical History:  Diagnosis Date  . Diabetes mellitus without complication ([place])   . Hypertension    Past Surgical History:  Procedure Laterality Date  . FOOT SURGERY     OB History   No obstetric history on file.    Home Medications    Prior to Admission medications   Medication Sig Start Date End Date Taking? Authorizing Provider  GLIPIZIDE PO Take by mouth daily.   Yes [provider]  HYDRALAZINE-HCTZ PO Take by mouth daily.   Yes [provider]  LISINOPRIL PO Take by mouth daily.   Yes [provider]  METFORMIN HCL PO Take by mouth 2 (two) times daily.   Yes [provider]  meloxicam (MOBIC) 15 MG tablet Take 1 tablet (15 mg total) by mouth daily as needed for pain. 02/21/19   Coral Spikes, DO  tiZANidine (ZANAFLEX) 4 MG tablet Take 1 tablet (4 mg total) by mouth every 8 (eight) hours as needed for muscle spasms. 02/21/19   Coral Spikes, DO    Family History Family History  Problem Relation Age of Onset  . Healthy Mother   . Diabetes Father   . Breast cancer Neg Hx     Social History Social History   Tobacco Use  . Smoking status: Never Smoker  . Smokeless tobacco: Never Used  Substance Use Topics  . Alcohol use: Yes    Comment: Occasionally  . Drug use: No     Allergies   Patient has no known  allergies.   Review of Systems Review of Systems  Constitutional: Negative.   Genitourinary: Negative.   Musculoskeletal: Positive for back pain.   Physical Exam Triage Vital Signs ED Triage Vitals  Enc Vitals Group     BP 02/21/19 0935 138/75     Pulse Rate 02/21/19 0935 70     Resp 02/21/19 0935 16     Temp 02/21/19 0935 98.2 F (36.8 C)     Temp Source 02/21/19 0935 Oral     SpO2 02/21/19 0935 98 %     Weight 02/21/19 0932 154 lb (69.9 kg)     Height 02/21/19 0932 4\' 11"  (1.499 m)     Head Circumference --      Peak Flow --      Pain Score 02/21/19 0932 8     Pain Loc --      Pain Edu? --      Excl. in [place]? --    Updated Vital Signs BP 138/75 (BP Location: Right Arm)   Pulse 70   Temp 98.2 F (36.8 C) (Oral)   Resp 16   Ht 4\' 11"  (1.499 m)   Wt 69.9 kg   SpO2 98%   BMI 31.10 kg/m   Visual Acuity Right Eye Distance:   Left Eye Distance:  Bilateral Distance:    Right Eye Near:   Left Eye Near:    Bilateral Near:     Physical Exam Vitals signs and nursing note reviewed.  Constitutional:      General: She is not in acute distress.    Appearance: Normal appearance.  HENT:     Head: Normocephalic and atraumatic.  Eyes:     General:        Right eye: No discharge.        Left eye: No discharge.     Conjunctiva/sclera: Conjunctivae normal.  Cardiovascular:     Rate and Rhythm: Normal rate and regular rhythm.  Pulmonary:     Effort: Pulmonary effort is normal.     Breath sounds: Normal breath sounds.  Musculoskeletal:       Back:     Comments: Tenderness to palpation of the leg location.  No midline tenderness.  Neurological:     Mental Status: She is alert.  Psychiatric:        Mood and Affect: Mood normal.        Behavior: Behavior normal.    UC Treatments / Results  Labs (all labs ordered are listed, but only abnormal results are displayed) Labs Reviewed  URINALYSIS, COMPLETE (UACMP) WITH MICROSCOPIC - Abnormal; Notable for the following  components:      Result Value   pH 8.5 (*)    Glucose, UA 100 (*)    Protein, ur 30 (*)    Bacteria, UA RARE (*)    All other components within normal limits    EKG None  Radiology No results found.  Procedures Procedures (including critical care time)  Medications Ordered in UC Medications - No data to display  Initial Impression / Assessment and Plan / UC Course  I have reviewed the triage vital signs and the nursing notes.  Pertinent labs & imaging results that were available during my care of the patient were reviewed by me and considered in my medical decision making (see chart for details).    53 year old female presents with acute low back pain.  Treating with meloxicam and Zanaflex.  Work note given.  Final Clinical Impressions(s) / UC Diagnoses   Final diagnoses:  Acute left-sided low back pain without sciatica     Discharge Instructions     Rest.  Ice and Heat.  Medication as prescribed.  Take care  Dr. Adriana Simasook    ED Prescriptions    Medication Sig Dispense Auth. Provider   meloxicam (MOBIC) 15 MG tablet Take 1 tablet (15 mg total) by mouth daily as needed for pain. 30 tablet Nur Krasinski G, DO   tiZANidine (ZANAFLEX) 4 MG tablet Take 1 tablet (4 mg total) by mouth every 8 (eight) hours as needed for muscle spasms. 30 tablet Tommie Samsook, Keyana Guevara G, DO     Controlled Substance Prescriptions  Controlled Substance Registry consulted? Not Applicable   Tommie SamsCook, Lindwood Mogel G, DO 02/21/19 1023

## 2019-02-21 NOTE — ED Triage Notes (Signed)
Patient c/o mid-lower back pain that started last night.  Patient denies injury or fall.

## 2019-06-27 ENCOUNTER — Ambulatory Visit (INDEPENDENT_AMBULATORY_CARE_PROVIDER_SITE_OTHER)
Admission: EM | Admit: 2019-06-27 | Discharge: 2019-06-27 | Disposition: A | Discharge status: Other Acute Inpatient Hospital | Payer: 59 | Source: Home / Self Care

## 2019-06-27 ENCOUNTER — Emergency Department: Payer: 59

## 2019-06-27 ENCOUNTER — Encounter: Payer: Self-pay | Admitting: Emergency Medicine

## 2019-06-27 ENCOUNTER — Emergency Department
Admission: EM | Admit: 2019-06-27 | Discharge: 2019-06-27 | Disposition: A | Discharge status: Home / Self Care | Payer: 59 | Attending: Emergency Medicine | Admitting: Emergency Medicine

## 2019-06-27 ENCOUNTER — Other Ambulatory Visit: Payer: Self-pay

## 2019-06-27 DIAGNOSIS — E119 Type 2 diabetes mellitus without complications: Secondary | ICD-10-CM | POA: Insufficient documentation

## 2019-06-27 DIAGNOSIS — I10 Essential (primary) hypertension: Secondary | ICD-10-CM | POA: Diagnosis not present

## 2019-06-27 DIAGNOSIS — R0789 Other chest pain: Secondary | ICD-10-CM | POA: Insufficient documentation

## 2019-06-27 DIAGNOSIS — R112 Nausea with vomiting, unspecified: Secondary | ICD-10-CM | POA: Insufficient documentation

## 2019-06-27 DIAGNOSIS — Z79899 Other long term (current) drug therapy: Secondary | ICD-10-CM | POA: Diagnosis not present

## 2019-06-27 DIAGNOSIS — R079 Chest pain, unspecified: Secondary | ICD-10-CM

## 2019-06-27 DIAGNOSIS — R42 Dizziness and giddiness: Secondary | ICD-10-CM

## 2019-06-27 DIAGNOSIS — Z794 Long term (current) use of insulin: Secondary | ICD-10-CM | POA: Insufficient documentation

## 2019-06-27 DIAGNOSIS — R9431 Abnormal electrocardiogram [ECG] [EKG]: Secondary | ICD-10-CM | POA: Insufficient documentation

## 2019-06-27 LAB — BASIC METABOLIC PANEL
Anion gap: 14 (ref 5–15)
BUN: 12 mg/dL (ref 6–20)
CO2: 27 mmol/L (ref 22–32)
Calcium: 9.9 mg/dL (ref 8.9–10.3)
Chloride: 100 mmol/L (ref 98–111)
Creatinine, Ser: 0.65 mg/dL (ref 0.44–1.00)
GFR calc Af Amer: >60 mL/min (ref >60)
GFR calc non Af Amer: >60 mL/min (ref >60)
Glucose, Bld: 246 mg/dL — ABNORMAL HIGH (ref 70–99)
Potassium: 3.7 mmol/L (ref 3.5–5.1)
Sodium: 141 mmol/L (ref 135–145)

## 2019-06-27 LAB — TROPONIN I (HIGH SENSITIVITY)
Troponin I (High Sensitivity): 4 ng/L (ref <18)
Troponin I (High Sensitivity): 3 ng/L (ref <18)

## 2019-06-27 LAB — CBC
HCT: 34.9 % — ABNORMAL LOW (ref 36.0–46.0)
Hemoglobin: 11.3 g/dL — ABNORMAL LOW (ref 12.0–15.0)
MCH: 29.7 pg (ref 26.0–34.0)
MCHC: 32.4 g/dL (ref 30.0–36.0)
MCV: 91.8 fL (ref 80.0–100.0)
Platelets: 286 10*3/uL (ref 150–400)
RBC: 3.8 MIL/uL — ABNORMAL LOW (ref 3.87–5.11)
RDW: 12.6 % (ref 11.5–15.5)
WBC: 5.9 10*3/uL (ref 4.0–10.5)
nRBC: 0 % (ref 0.0–0.2)

## 2019-06-27 LAB — GLUCOSE, CAPILLARY: Glucose-Capillary: 230 mg/dL — ABNORMAL HIGH (ref 70–99)

## 2019-06-27 NOTE — Discharge Instructions (Signed)
Leave [facility] and proceed directly to the ED at [facility] for further evaluation.   Honor Loh, MSN, APRN, FNP-C, CEN Advanced Practice Provider [facility] 06/27/2019 2:43 PM

## 2019-06-27 NOTE — ED Triage Notes (Signed)
Patient c/o headache, dizziness, nausea and vomiting that started 20 minutes ago.

## 2019-06-27 NOTE — ED Provider Notes (Signed)
[place], [place]   Name: Ariana SchickSusan A Jacobs DOB: 21-Sep-1965 MRN: [id number] CSN: [id number] PCP: Alba CorySowles, Krichna, MD  Arrival date and time:  06/27/19 1340  Chief Complaint:  Headache and Dizziness   NOTE: Prior to seeing the patient today, I have reviewed the triage nursing documentation and vital signs. Clinical staff has updated patient's PMH/PSHx, current medication list, and drug allergies/intolerances to ensure comprehensive history available to assist in medical decision making.   History:   HPI: Ariana Jacobs is a 53 y.o. female who presents today with complaints of multiple medical complaints today. Patient reports that approximately 20 minutes prior to arrival, she had eaten some [place] sausage. Patient reports that she developed a brief episode of retrosternal chest pain that felt like "some food was stuck". Patient notes that she became nauseated and vomited x 1 episode, which relieved the pain in her chest. She denies radiation of the pain into her shoulder, neck, jaw, and subscapular areas. Patient is not having any pain in her LEFT upper extremity. She denies associated shortness of breath, nausea, vomiting, and diaphoresis. Patient does not have a history of gastrointestinal reflux. Pain was not reproducible with movement, palpation, or deep inspiration. Patient indicates that the pain was relieved/improved by rest. Patient advising that she has not experienced similar episodes of pain in her chest in the past. Patient denies a past medical history significant for any known cardiac problems. Patient does not have a history of any sort of cardiac arrhythmias; no pacemaker or AICD. She notes that her family history is also negative for MI, CAD, heart failure, VTE, and sudden cardiac death. She has never been diagnosed with a DVT or PE in the past. She is not on daily anticoagulation therapy. Patient does not take aspirin on a daily basis. Patient also complains of a generalized headache  and dizziness.   Past Medical History:  Diagnosis Date   Diabetes mellitus without complication (HCC)    Hypertension     Past Surgical History:  Procedure Laterality Date   FOOT SURGERY      Family History  Problem Relation Age of Onset   Healthy Mother    Diabetes Father    Breast cancer Neg Hx     Social History   Tobacco Use   Smoking status: Never Smoker   Smokeless tobacco: Never Used  Substance Use Topics   Alcohol use: Yes    Comment: Occasionally   Drug use: No    There are no active problems to display for this patient.   Home Medications:    Current Meds  Medication Sig   amLODipine (NORVASC) 5 MG tablet    GLIPIZIDE PO Take by mouth daily.   HYDRALAZINE-HCTZ PO Take by mouth daily.   LEVEMIR FLEXTOUCH 100 UNIT/ML Pen    LISINOPRIL PO Take by mouth daily.   lisinopril-hydrochlorothiazide (ZESTORETIC) 20-25 MG tablet    metFORMIN (GLUCOPHAGE) 1000 MG tablet 2 (two) times daily with a meal.     Allergies:   Patient has no known allergies.  Review of Systems (ROS): Review of Systems  Constitutional: Negative for diaphoresis, fever, malaise/fatigue and weight loss.  Respiratory: Negative for cough, hemoptysis, sputum production and shortness of breath.   Cardiovascular: Positive for chest pain. Negative for palpitations, orthopnea, leg swelling and PND.  Gastrointestinal: Positive for nausea and vomiting. Negative for abdominal pain, blood in stool, constipation, diarrhea and melena.  Genitourinary: Negative for dysuria, frequency, hematuria and urgency.  Musculoskeletal: Negative for back pain, falls,  joint pain and myalgias.  Skin: Negative for itching and rash.  Neurological: Positive for headaches. Negative for dizziness, tremors and weakness.  Endo/Heme/Allergies: Does not bruise/bleed easily.  Psychiatric/Behavioral: Negative for depression, memory loss and suicidal ideas. The patient is nervous/anxious. The patient does not  have insomnia.   All other systems reviewed and are negative.   Vital Signs: Today's Vitals   06/27/19 1352 06/27/19 1357  BP:  (!) 173/80  Pulse:  70  Resp:  18  Temp:  98.6 F (37 C)  TempSrc:  Oral  SpO2:  100%  Weight: 150 lb (68 kg)   Height: 4\' 11"  (1.499 m)   PainSc: 8      Physical Exam: Physical Exam  Constitutional: She is oriented to person, place, and time and well-developed, well-nourished, and in no distress. No distress.  HENT:  Head: Normocephalic and atraumatic.  Nose: Nose normal.  Mouth/Throat: Oropharynx is clear and moist.  Eyes: Pupils are equal, round, and reactive to light. Conjunctivae and EOM are normal.  Neck: Normal range of motion. Neck supple.  Cardiovascular: Normal rate, regular rhythm, normal heart sounds and intact distal pulses. Exam reveals no gallop and no friction rub.  No murmur heard. Pulmonary/Chest: Effort normal. No respiratory distress. She has no decreased breath sounds. She has no wheezes. She has no rhonchi. She has no rales.  Abdominal: Soft. Normal appearance and bowel sounds are normal. She exhibits no distension. There is no abdominal tenderness.  Musculoskeletal: Normal range of motion.  Neurological: She is alert and oriented to person, place, and time. Gait normal.  Skin: Skin is warm and dry. No rash noted. She is not diaphoretic.  Psychiatric: Mood, memory, affect and judgment normal.  Nursing note and vitals reviewed.    Urgent Care Treatments / Results:   LABS: PLEASE NOTE: all labs that were ordered this encounter are listed, however only abnormal results are displayed. Labs Reviewed  GLUCOSE, CAPILLARY - Abnormal; Notable for the following components:      Result Value   Glucose-Capillary 230 (*)    All other components within normal limits  CBG MONITORING, ED    URGENT CARE ECG REPORTS Date: 06/27/2019 Time ECG obtained:  1408 PM Rate: 65 bpm Rhythm: normal sinus rhythm  Intervals: normal ST segment  and T wave changes: Non-specific T-wave abnormality; T-wave inversions in II, III, aVF, V3-V6 Comparison: Changes new when compared to previous EKG obtained on 02/26/2013  Date: 06/27/2019 Time ECG obtained:  1429 PM Rate: 71 bpm Rhythm: normal sinus rhythm  Intervals: normal ST segment and T wave changes: Non-specific T-wave abnormality; T-wave inversions in II, III, aVF, V3-V6 Comparison: No changes from previous obtained earlier on 06/27/2019.   RADIOLOGY: No results found.  PROCEDURES: Procedures  MEDICATIONS RECEIVED THIS VISIT: Medications - No data to display  PERTINENT CLINICAL COURSE NOTES/UPDATES: Clinical Course as of Jun 26 1438  Thu Jun 27, 2019  1438 Patient report called to ED charge nurse. Patient to present for further cardiac evaluation via [facility].    [BG]    Clinical Course User Index [BG] Karen Kitchens, NP   Initial Impression / Assessment and Plan / Urgent Care Course:  Pertinent labs & imaging results that were available during my care of the patient were personally reviewed by me and considered in my medical decision making (see lab/imaging section of note for values and interpretations).  AMINA MENCHACA is a 53 y.o. female who presents to [facility] today with complaints of  Headache and Dizziness  Patient is well appearing overall in clinic today. She does not appear to be in any acute distress. Presenting symptoms (see HPI) and exam as documented above. Presenting symptoms concerning for possible cardiac etiology given nausea, vomiting, and complaints of chest pain. CP has resolved, but she continues to complain of nausea and a headache. She has EKG changes in (T-wave inversions) leads II, III, aVF, V3-V6 that were not present on last EKG that was available for me to review from 2014. Discussed need for further evaluation in the emergency department. Discussed concerns with patient based on HPI and workup thus far. She was advised that further  workup in the emergency department is warranted in order to obtain a more comprehensive assessment of her symptoms. Patient in agreement to be transported to [facility] ED via EMS transport. Call placed to ED charge nurse (see notes above for timeline). Charge nurse advised of HPI, assessment, workup, and plans for her to present to there ED for ongoing management. Nurse aware that patient will be presenting via EMS. [facility] staff encouraged to return call to [facility] with any questions or concerns related to the care that Ariana Jacobs  received here today.   Final Clinical Impressions / Urgent Care Diagnoses:   1. Chest pain, unspecified type   2. Dizziness   3. Non-intractable vomiting with nausea, unspecified vomiting type   4. T wave inversion in EKG     New Prescriptions:  [place] Controlled Substance Registry consulted? Not Applicable  No orders of the defined types were placed in this encounter.   Recommended Follow up Care:  Patient encouraged to follow up with the following provider within the specified time frame, or sooner as dictated by the severity of her symptoms. As always, she was instructed that for any urgent/emergent care needs, she should seek care either here or in the emergency department for more immediate evaluation.  NOTE: This note was prepared using Scientist, clinical (histocompatibility and immunogenetics) along with smaller Lobbyist. Despite my best ability to proofread, there is the potential that transcriptional errors may still occur from this process, and are completely unintentional.     Verlee Monte, NP 06/27/19 2031

## 2019-06-27 NOTE — ED Notes (Signed)
First nurse note: Pt here via ems from [facility] [facility], c/o cp initially. NAD, vss, denies pain at this time.

## 2019-06-27 NOTE — ED Triage Notes (Signed)
Patient presents to the ED with chest pain, nausea and vomiting that occurred around 1:30pm.  Patient states she now feels, "okay" and the chest pain is no longer there.  Patient describes pain as, "it felt like something was stuck in my throat."  Patient is in no obvious distress at this time.

## 2019-06-27 NOTE — ED Notes (Signed)
Pt states all of her symptoms started today right after having HA/dizzy spell. Pt currently denies pain and is requesting gingerale and crackers.

## 2019-06-27 NOTE — ED Notes (Signed)
Urine sample collected. Urine preg neg.

## 2019-06-27 NOTE — ED Provider Notes (Signed)
[facility] Emergency Department Provider Note   ____________________________________________   I have reviewed the triage vital signs and the nursing notes.   HISTORY  Chief Complaint Chest Pain   History limited by: Not Limited   HPI Ariana Jacobs is a 53 y.o. female who presents to the emergency department today after experiencing an episode of chest discomfort, dizziness, nausea and vomiting.  The patient states that she felt fine this morning.  She was then eating later in the day when she started developing the symptoms.  They are slightly preceded by a slight headache.  She describes chest discomfort in her center part of her chest.  She felt like she was coming pass out.  She did have 1 episode of emesis.  The patient says that the symptoms lasted for 5 to 10 minutes.  By the time my exam her symptoms have resolved.  Went to urgent care where they were concerned for some inverted T waves so sent her to the emergency department.  Patient denies known history of heart disease.   Records reviewed. Per medical record review patient has a history of DM, HTN.   Past Medical History:  Diagnosis Date  . Diabetes mellitus without complication (HCC)   . Hypertension     There are no active problems to display for this patient.   Past Surgical History:  Procedure Laterality Date  . FOOT SURGERY      Prior to Admission medications   Medication Sig Start Date End Date Taking? Authorizing Provider  amLODipine (NORVASC) 5 MG tablet  01/22/19   [provider]  GLIPIZIDE PO Take by mouth daily.    [provider]  HYDRALAZINE-HCTZ PO Take by mouth daily.    [provider]  hydrochlorothiazide (HYDRODIURIL) 25 MG tablet  01/01/19   [provider]  LEVEMIR FLEXTOUCH 100 UNIT/ML Pen  02/12/19   [provider]  LISINOPRIL PO Take by mouth daily.    [provider]  lisinopril-hydrochlorothiazide  (ZESTORETIC) 20-25 MG tablet  01/08/19   [provider]  metFORMIN (GLUCOPHAGE) 1000 MG tablet 2 (two) times daily with a meal.  02/04/19   [provider]    Allergies Patient has no known allergies.  Family History  Problem Relation Age of Onset  . Healthy Mother   . Diabetes Father   . Breast cancer Neg Hx     Social History Social History   Tobacco Use  . Smoking status: Never Smoker  . Smokeless tobacco: Never Used  Substance Use Topics  . Alcohol use: Yes    Comment: Occasionally  . Drug use: No    Review of Systems Constitutional: No fever/chills Eyes: No visual changes. ENT: No sore throat. Cardiovascular: Positive for chest pain. Respiratory: Denies shortness of breath. Gastrointestinal: No abdominal pain.  Positive for nausea and vomiting.  Genitourinary: Negative for dysuria. Musculoskeletal: Negative for back pain. Skin: Negative for rash. Neurological: Positive for lightheadedness.   ____________________________________________   PHYSICAL EXAM:  VITAL SIGNS: ED Triage Vitals  Enc Vitals Group     BP 06/27/19 1541 (!) 180/77     Pulse Rate 06/27/19 1541 65     Resp 06/27/19 1541 16     Temp 06/27/19 1541 98.8 F (37.1 C)     Temp Source 06/27/19 1541 Oral     SpO2 06/27/19 1541 100 %     Weight 06/27/19 1542 150 lb (68 kg)     Height 06/27/19 1542 4\' 11"  (  1.499 m)     Head Circumference --      Peak Flow --      Pain Score 06/27/19 1541 0   Constitutional: Alert and oriented.  Eyes: Conjunctivae are normal.  ENT      Head: Normocephalic and atraumatic.      Nose: No congestion/rhinnorhea.      Mouth/Throat: Mucous membranes are moist.      Neck: No stridor. Hematological/Lymphatic/Immunilogical: No cervical lymphadenopathy. Cardiovascular: Normal rate, regular rhythm.  No murmurs, rubs, or gallops.  Respiratory: Normal respiratory effort without tachypnea nor retractions. Breath sounds are clear and equal bilaterally. No  wheezes/rales/rhonchi. Gastrointestinal: Soft and non tender. No rebound. No guarding.  Genitourinary: Deferred Musculoskeletal: Normal range of motion in all extremities. No lower extremity edema. Neurologic:  Normal speech and language. No gross focal neurologic deficits are appreciated.  Skin:  Skin is warm, dry and intact. No rash noted. Psychiatric: Mood and affect are normal. Speech and behavior are normal. Patient exhibits appropriate insight and judgment.  ____________________________________________    LABS (pertinent positives/negatives)  Trop hs 4 -> 3 BMP wnl except glu 246 CBC wbc 5.9, hgb 11.3, plt 286  ____________________________________________   EKG  I, Nance Pear, attending physician, personally viewed and interpreted this EKG  EKG Time: 1532 Rate: 65 Rhythm: normal sinus rhythm Axis: normal Intervals: qtc 420 QRS: narrow ST changes: no st elevation, t wave inversion II, III, aVF, v3-v6 Impression: abnormal ekg  ____________________________________________    RADIOLOGY  CXR No acute abnromality  ____________________________________________   PROCEDURES  Procedures  ____________________________________________   INITIAL IMPRESSION / ASSESSMENT AND PLAN / ED COURSE  Pertinent labs & imaging results that were available during my care of the patient were reviewed by me and considered in my medical decision making (see chart for details).   Patient presented to the emergency department today after an episode of chest pain, lightheadedness, nausea and vomiting.  At the time my exam the patient felt better.  Troponins were negative x2.  X-ray did not show any concerning findings.  I had a discussion with the patient.  While I doubt ACS I did discuss that it is possible she does have some cardiac disease.  Certainly no signs of heart damage at this time.  While her EKG did show some T waves and is unclear how long they may have been present.   Previous EKG in our system was dated 6 years ago and did not have inverted T waves.  This point given the patient feels better I do think is reasonable for her to be discharged home.  Will plan on giving patient cardiology follow-up information. Discussed findings and plan with patient.   ___________________________________________   FINAL CLINICAL IMPRESSION(S) / ED DIAGNOSES  Final diagnoses:  Nonspecific chest pain  Dizziness     Note: This dictation was prepared with Dragon dictation. Any transcriptional errors that result from this process are unintentional     Nance Pear, MD 06/27/19 1845

## 2019-06-27 NOTE — ED Notes (Signed)
EMS IV removed.

## 2019-06-27 NOTE — Discharge Instructions (Addendum)
Please seek medical attention for any high fevers, chest pain, shortness of breath, change in behavior, persistent vomiting, bloody stool or any other new or concerning symptoms.  

## 2019-06-28 LAB — POCT PREGNANCY, URINE: Preg Test, Ur: NEGATIVE

## 2019-07-31 ENCOUNTER — Ambulatory Visit
Admission: EM | Admit: 2019-07-31 | Discharge: 2019-07-31 | Disposition: A | Discharge status: Home / Self Care | Payer: 59 | Attending: Family Medicine | Admitting: Family Medicine

## 2019-07-31 ENCOUNTER — Other Ambulatory Visit: Payer: Self-pay

## 2019-07-31 ENCOUNTER — Encounter: Payer: Self-pay | Admitting: Emergency Medicine

## 2019-07-31 DIAGNOSIS — Z20822 Contact with and (suspected) exposure to covid-19: Secondary | ICD-10-CM

## 2019-07-31 DIAGNOSIS — Z20828 Contact with and (suspected) exposure to other viral communicable diseases: Secondary | ICD-10-CM

## 2019-07-31 NOTE — ED Provider Notes (Signed)
[facility]    CSN: [id number] Arrival date & time: 07/31/19  1301      History   Chief Complaint Chief Complaint  Patient presents with  . covid exposure    HPI Ariana Jacobs is a 53 y.o. female.   53 yo female with a c/o covid exposure at school. Denies any symptoms currently.      Past Medical History:  Diagnosis Date  . Diabetes mellitus without complication ([place])   . Hypertension     There are no active problems to display for this patient.   Past Surgical History:  Procedure Laterality Date  . FOOT SURGERY      OB History   No obstetric history on file.      Home Medications    Prior to Admission medications   Medication Sig Start Date End Date Taking? Authorizing Provider  amLODipine (NORVASC) 5 MG tablet  01/22/19  Yes [provider]  HYDRALAZINE-HCTZ PO Take by mouth daily.   Yes [provider]  LEVEMIR FLEXTOUCH 100 UNIT/ML Pen  02/12/19  Yes [provider]  lisinopril-hydrochlorothiazide (ZESTORETIC) 20-25 MG tablet  01/08/19  Yes [provider]  metFORMIN (GLUCOPHAGE) 1000 MG tablet 2 (two) times daily with a meal.  02/04/19  Yes [provider]  GLIPIZIDE PO Take by mouth daily.    [provider]  hydrochlorothiazide (HYDRODIURIL) 25 MG tablet  01/01/19   [provider]  LISINOPRIL PO Take by mouth daily.    [provider]    Family History Family History  Problem Relation Age of Onset  . Healthy Mother   . Diabetes Father   . Breast cancer Neg Hx     Social History Social History   Tobacco Use  . Smoking status: Never Smoker  . Smokeless tobacco: Never Used  Substance Use Topics  . Alcohol use: Yes    Comment: Occasionally  . Drug use: No     Allergies   Patient has no known allergies.   Review of Systems Review of Systems   Physical Exam Triage Vital Signs ED Triage Vitals  Enc Vitals Group     BP 07/31/19 1318 (!) 157/81   Pulse Rate 07/31/19 1318 79     Resp 07/31/19 1318 18     Temp 07/31/19 1318 98.6 F (37 C)     Temp Source 07/31/19 1318 Oral     SpO2 07/31/19 1318 99 %     Weight 07/31/19 1318 150 lb (68 kg)     Height 07/31/19 1318 4\' 11"  (1.499 m)     Head Circumference --      Peak Flow --      Pain Score 07/31/19 1317 0     Pain Loc --      Pain Edu? --      Excl. in [place]? --    No data found.  Updated Vital Signs BP (!) 157/81 (BP Location: Left Arm)   Pulse 79   Temp 98.6 F (37 C) (Oral)   Resp 18   Ht 4\' 11"  (1.499 m)   Wt 68 kg   SpO2 99%   BMI 30.30 kg/m   Visual Acuity Right Eye Distance:   Left Eye Distance:   Bilateral Distance:    Right Eye Near:   Left Eye Near:    Bilateral Near:     Physical Exam Vitals signs and nursing note reviewed.  Constitutional:      General: She is  not in acute distress.    Appearance: She is not toxic-appearing or diaphoretic.  Pulmonary:     Effort: Pulmonary effort is normal. No respiratory distress.  Neurological:     Mental Status: She is alert.      UC Treatments / Results  Labs (all labs ordered are listed, but only abnormal results are displayed) Labs Reviewed  NOVEL CORONAVIRUS, NAA (HOSP ORDER, SEND-OUT TO REF LAB; TAT 18-24 HRS)    EKG   Radiology No results found.  Procedures Procedures (including critical care time)  Medications Ordered in UC Medications - No data to display  Initial Impression / Assessment and Plan / UC Course  I have reviewed the triage vital signs and the nursing notes.  Pertinent labs & imaging results that were available during my care of the patient were reviewed by me and considered in my medical decision making (see chart for details).      Final Clinical Impressions(s) / UC Diagnoses   Final diagnoses:  Close exposure to COVID-19 virus    ED Prescriptions    None      1. diagnosis reviewed with patient 2. covid test done 3. F/u prn  PDMP not reviewed this  encounter.   Payton Mccallum, MD 07/31/19 1451

## 2019-07-31 NOTE — ED Triage Notes (Signed)
Patient in today with a positive covid exposure at school on 07/23/19. Patient denies any symptoms.

## 2019-08-01 LAB — NOVEL CORONAVIRUS, NAA (HOSP ORDER, SEND-OUT TO REF LAB; TAT 18-24 HRS): SARS-CoV-2, NAA: NOT DETECTED

## 2019-09-20 ENCOUNTER — Ambulatory Visit
Admission: EM | Admit: 2019-09-20 | Discharge: 2019-09-20 | Disposition: A | Discharge status: Home / Self Care | Payer: 59 | Attending: Family Medicine | Admitting: Family Medicine

## 2019-09-20 ENCOUNTER — Other Ambulatory Visit: Payer: Self-pay

## 2019-09-20 DIAGNOSIS — R5383 Other fatigue: Secondary | ICD-10-CM

## 2019-09-20 DIAGNOSIS — Z20822 Contact with and (suspected) exposure to covid-19: Secondary | ICD-10-CM | POA: Diagnosis present

## 2019-09-20 DIAGNOSIS — B349 Viral infection, unspecified: Secondary | ICD-10-CM | POA: Insufficient documentation

## 2019-09-20 DIAGNOSIS — R519 Headache, unspecified: Secondary | ICD-10-CM | POA: Diagnosis not present

## 2019-09-20 DIAGNOSIS — R05 Cough: Secondary | ICD-10-CM

## 2019-09-20 DIAGNOSIS — M791 Myalgia, unspecified site: Secondary | ICD-10-CM | POA: Diagnosis not present

## 2019-09-20 NOTE — Discharge Instructions (Signed)
It was very nice seeing you today in clinic. Thank you for entrusting me with your care.   Rest and Stay HYDRATED. Water and electrolyte containing beverages (Gatorade, Pedialyte) are best to prevent dehydration and electrolyte abnormalities. Continue over the counter medications as you already are taking.    You were tested for SARS-CoV-2 (novel coronavirus) today. Testing is performed by an outside lab ([facility]) and has variable turn around times ranging between 2-5 days. Current recommendations from the the CDC and [place] DHHS require that you remain out of work in order to quarantine at home until negative test results are have been received. In the event that your test results are positive, you will be contacted with further directives. These measures are being implemented out of an abundance of caution to prevent transmission and spread during the current SARS-CoV-2 pandemic.  Make arrangements to follow up with your regular doctor in 1 week for re-evaluation if not improving. * If your symptoms/condition worsens, please seek follow up care either here or in the ER. Please remember, our [facility] providers are "right here with you" when you need [place].   Again, it was my pleasure to take care of you today. Thank you for choosing our clinic. I hope that you start to feel better quickly.   Quentin Mulling, MSN, APRN, FNP-C, CEN Advanced Practice Provider [facility]

## 2019-09-20 NOTE — ED Triage Notes (Addendum)
Pt presents with c/o continued diarrhea, body aches and headache. She states she has been taking multiple OTC medications for cough/cold/flu but has not seen any improvement. She denies any fever, cough, shob or other symptoms. She reports she was tested on 09/16/19 for COVID but was negative. She would like to be retested. She reports direct COVID exposure around 09/14/19.

## 2019-09-21 LAB — NOVEL CORONAVIRUS, NAA (HOSP ORDER, SEND-OUT TO REF LAB; TAT 18-24 HRS): SARS-CoV-2, NAA: NOT DETECTED

## 2019-09-21 NOTE — ED Provider Notes (Signed)
[place], [place]   Name: ADRIE PICKING DOB: 07/30/1966 MRN: [id number] CSN: [id number] PCP: Alba Cory, MD  Arrival date and time:  09/20/19 1541  Chief Complaint:  Generalized Body Aches and Headache   NOTE: Prior to seeing the patient today, I have reviewed the triage nursing documentation and vital signs. Clinical staff has updated patient's PMH/PSHx, current medication list, and drug allergies/intolerances to ensure comprehensive history available to assist in medical decision making.   History:   HPI: KELECHI ASTARITA is a 54 y.o. female who presents today with complaints of cough, generalized myalgias, and headaches that started approximately 5 days ago. Patient denies fevers. She denies any cough, shortness of breath, or wheezing.  She complains of that she has experienced any nausea and diarrhea, however denies vomiting and abdominal pain. She is eating and drinking well. Patient denies any perceived alterations to her sense of taste or smell. Patient presents out of concerns for her personal health after being exposed to someone who tested positive for SARS-CoV-2 (novel coronavirus) on 09/14/2019. She was tested for SARS-CoV-2 on 09/16/2019 and her results were negative. Patient feels as if she tested too early and is requesting to be re-tested given the persistence of her symptoms. Patient has not been vaccinated for influenza this season. In efforts to conservatively manage her symptoms at home, the patient notes that she has used Dayquil/Nyquil, decongestants, and loperaminde, which have only minimally helped to improve her symptoms. Her job is also requiring her to be retested given her symptoms.   Past Medical History:  Diagnosis Date  . Diabetes mellitus without complication (HCC)   . Hypertension     Past Surgical History:  Procedure Laterality Date  . FOOT SURGERY      Family History  Problem Relation Age of Onset  . Healthy Mother   . Diabetes Father   .  Breast cancer Neg Hx     Social History   Tobacco Use  . Smoking status: Never Smoker  . Smokeless tobacco: Never Used  Substance Use Topics  . Alcohol use: Yes    Comment: Occasionally  . Drug use: No    There are no problems to display for this patient.   Home Medications:    Current Meds  Medication Sig  . amLODipine (NORVASC) 5 MG tablet   . HYDRALAZINE-HCTZ PO Take by mouth daily.  Marland Kitchen LEVEMIR FLEXTOUCH 100 UNIT/ML Pen   . lisinopril-hydrochlorothiazide (ZESTORETIC) 20-25 MG tablet   . metFORMIN (GLUCOPHAGE) 1000 MG tablet 2 (two) times daily with a meal.   . [DISCONTINUED] GLIPIZIDE PO Take by mouth daily.  . [DISCONTINUED] hydrochlorothiazide (HYDRODIURIL) 25 MG tablet   . [DISCONTINUED] LISINOPRIL PO Take by mouth daily.    Allergies:   Patient has no known allergies.  Review of Systems (ROS): Review of Systems  Constitutional: Positive for fatigue. Negative for fever.  HENT: Negative for congestion, ear pain, postnasal drip, rhinorrhea, sinus pressure, sinus pain, sneezing and sore throat.   Eyes: Negative for pain, discharge and redness.  Respiratory: Positive for cough. Negative for chest tightness and shortness of breath.   Cardiovascular: Negative for chest pain and palpitations.  Gastrointestinal: Positive for diarrhea and nausea. Negative for abdominal pain and vomiting.  Musculoskeletal: Positive for myalgias. Negative for arthralgias, back pain and neck pain.  Skin: Negative for color change, pallor and rash.  Neurological: Positive for headaches. Negative for dizziness, syncope and weakness.  Hematological: Negative for adenopathy.     Vital Signs: Today's Vitals  09/20/19 1608 09/20/19 1610 09/20/19 1614 09/20/19 1638  BP:   137/76   Pulse:   78   Temp:   98.2 F (36.8 C)   TempSrc:   Oral   SpO2:   99%   Weight:  150 lb (68 kg)    Height:  4\' 11"  (1.499 m)    PainSc: 7    7     Physical Exam: Physical Exam  Constitutional: She is  oriented to person, place, and time and well-developed, well-nourished, and in no distress. Vital signs are normal.  Acutely ill appearing; fatigued/listless.  HENT:  Head: Normocephalic and atraumatic.  Right Ear: Tympanic membrane normal.  Left Ear: Tympanic membrane normal.  Nose: Nose normal.  Mouth/Throat: Uvula is midline, oropharynx is clear and moist and mucous membranes are normal.  Eyes: Pupils are equal, round, and reactive to light.  Cardiovascular: Normal rate, regular rhythm, normal heart sounds and intact distal pulses.  Pulmonary/Chest: Effort normal and breath sounds normal.  Mild cough noted in clinic. No SOB or increased WOB. No distress. Able to speak in complete sentences without difficulties. SPO2 99% on RA.  Abdominal: Soft. Normal appearance and bowel sounds are normal. She exhibits no distension. There is no abdominal tenderness. There is no CVA tenderness.  Musculoskeletal:     Cervical back: Normal range of motion and neck supple.  Neurological: She is alert and oriented to person, place, and time. Gait normal.  Skin: Skin is warm and dry. No rash noted. She is not diaphoretic.  Psychiatric: Mood, memory, affect and judgment normal.  Nursing note and vitals reviewed.   Urgent Care Treatments / Results:   Orders Placed This Encounter  Procedures  . Novel Coronavirus, NAA (Hosp order, Send-out to Ref Lab; TAT 18-24 hrs    LABS: PLEASE NOTE: all labs that were ordered this encounter are listed, however only abnormal results are displayed. Labs Reviewed  NOVEL CORONAVIRUS, NAA (HOSP ORDER, SEND-OUT TO REF LAB; TAT 18-24 HRS)    EKG: -None  RADIOLOGY: No results found.  PROCEDURES: Procedures  MEDICATIONS RECEIVED THIS VISIT: Medications - No data to display  PERTINENT CLINICAL COURSE NOTES/UPDATES:   Initial Impression / Assessment and Plan / Urgent Care Course:  Pertinent labs & imaging results that were available during my care of the patient  were personally reviewed by me and considered in my medical decision making (see lab/imaging section of note for values and interpretations).  ENGLISH TOMER is a 54 y.o. female who presents to [facility] today with complaints of Generalized Body Aches and Headache  Patient acutely ill appearing (non-toxic) today in clinic. She does not appear to be in any acute distress. Presenting symptoms (see HPI) and exam as documented above. She presents with symptoms associated with SARS-CoV-2 (novel coronavirus) following direct exposure on 09/14/2019. She tested negative for the virus on 09/16/2019, however would like to be retested citing that she feels as if she was tested too early. Employer also requiring retesting prior to allowing patient to RTW. Discussed typical symptom constellation. Reviewed potential for infection given exposure. Retesting is reasonably warranted.  SARS-CoV-2 swab collected by certified clinical staff. Discussed variable turn around times associated with testing, as swabs are being processed at [facility], and have been taking between 24-48 hours to come back. She was advised to self quarantine, per [facility] DHHS guidelines, until negative results received. These measures are being implemented out of an abundance of caution to prevent transmission and spread during the current  SARS-CoV-2 pandemic.  Presenting symptoms consistent with acute viral illness. Until ruled out with confirmatory lab testing, SARS-CoV-2 remains part of the differential. Her testing is pending at this time. I discussed with her that her symptoms are felt to be viral in nature, thus antibiotics would not offer her any relief or improve his symptoms any faster than conservative symptomatic management. Discussed supportive care measures at home during acute phase of illness. Patient to rest as much as possible. She was encouraged to ensure adequate hydration (water and ORS) to prevent dehydration and electrolyte  derangements. Patient may use APAP and/or IBU on an as needed basis for pain/fever. She may continue the OTC interventions as needed for symptomatic care.  Current clinical condition warrants patient being out of work in order to quarantine while waiting for testing results. She was provided with the appropriate documentation to provide to her place of employment that will allow for her to RTW on 09/23/2019 with no restrictions. RTW is contingent on her SARS-CoV-2 test results being reviewed as negative.     Discussed follow up with primary care physician in 1 week for re-evaluation. I have reviewed the follow up and strict return precautions for any new or worsening symptoms. Patient is aware of symptoms that would be deemed urgent/emergent, and would thus require further evaluation either here or in the emergency department. At the time of discharge, she verbalized understanding and consent with the discharge plan as it was reviewed with her. All questions were fielded by provider and/or clinic staff prior to patient discharge.    Final Clinical Impressions / Urgent Care Diagnoses:   Final diagnoses:  Viral illness  Suspected COVID-19 virus infection  Encounter for laboratory testing for COVID-19 virus    New Prescriptions:  [place] Controlled Substance Registry consulted? Not Applicable  No orders of the defined types were placed in this encounter.   Recommended Follow up Care:  Patient encouraged to follow up with the following provider within the specified time frame, or sooner as dictated by the severity of her symptoms. As always, she was instructed that for any urgent/emergent care needs, she should seek care either here or in the emergency department for more immediate evaluation.  Follow-up Information    Steele Sizer, MD In 1 week.   Specialty: Family Medicine Why: General reassessment of symptoms if not improving Contact information: [address]  [phone]         NOTE: This note was prepared using Dragon dictation software along with smaller phrase technology. Despite my best ability to proofread, there is the potential that transcriptional errors may still occur from this process, and are completely unintentional.    Karen Kitchens, NP 09/21/19 2154

## 2019-10-01 ENCOUNTER — Other Ambulatory Visit: Payer: Self-pay

## 2019-10-01 ENCOUNTER — Encounter: Payer: Self-pay | Admitting: Emergency Medicine

## 2019-10-01 ENCOUNTER — Emergency Department
Admission: EM | Admit: 2019-10-01 | Discharge: 2019-10-01 | Disposition: A | Discharge status: Home / Self Care | Payer: 59 | Attending: Student in an Organized Health Care Education/Training Program | Admitting: Student in an Organized Health Care Education/Training Program

## 2019-10-01 DIAGNOSIS — Z794 Long term (current) use of insulin: Secondary | ICD-10-CM | POA: Diagnosis not present

## 2019-10-01 DIAGNOSIS — R109 Unspecified abdominal pain: Secondary | ICD-10-CM | POA: Diagnosis present

## 2019-10-01 DIAGNOSIS — R1084 Generalized abdominal pain: Secondary | ICD-10-CM | POA: Diagnosis not present

## 2019-10-01 DIAGNOSIS — E119 Type 2 diabetes mellitus without complications: Secondary | ICD-10-CM | POA: Insufficient documentation

## 2019-10-01 DIAGNOSIS — I10 Essential (primary) hypertension: Secondary | ICD-10-CM | POA: Diagnosis not present

## 2019-10-01 DIAGNOSIS — Z79899 Other long term (current) drug therapy: Secondary | ICD-10-CM | POA: Diagnosis not present

## 2019-10-01 LAB — COMPREHENSIVE METABOLIC PANEL
ALT: 22 U/L (ref 0–44)
AST: 18 U/L (ref 15–41)
Albumin: 4.1 g/dL (ref 3.5–5.0)
Alkaline Phosphatase: 124 U/L (ref 38–126)
Anion gap: 11 (ref 5–15)
BUN: 12 mg/dL (ref 6–20)
CO2: 26 mmol/L (ref 22–32)
Calcium: 9.4 mg/dL (ref 8.9–10.3)
Chloride: 100 mmol/L (ref 98–111)
Creatinine, Ser: 0.73 mg/dL (ref 0.44–1.00)
GFR calc Af Amer: >60 mL/min (ref >60)
GFR calc non Af Amer: >60 mL/min (ref >60)
Glucose, Bld: 472 mg/dL — ABNORMAL HIGH (ref 70–99)
Potassium: 4.1 mmol/L (ref 3.5–5.1)
Sodium: 137 mmol/L (ref 135–145)
Total Bilirubin: 0.4 mg/dL (ref 0.3–1.2)
Total Protein: 7.6 g/dL (ref 6.5–8.1)

## 2019-10-01 LAB — URINALYSIS, COMPLETE (UACMP) WITH MICROSCOPIC
Bilirubin Urine: NEGATIVE
Glucose, UA: >=500 mg/dL — AB
Hgb urine dipstick: NEGATIVE
Ketones, ur: NEGATIVE mg/dL
Leukocytes,Ua: NEGATIVE
Nitrite: NEGATIVE
Protein, ur: NEGATIVE mg/dL
Specific Gravity, Urine: 1.037 — ABNORMAL HIGH (ref 1.005–1.030)
pH: 6 (ref 5.0–8.0)

## 2019-10-01 LAB — TROPONIN I (HIGH SENSITIVITY): Troponin I (High Sensitivity): <2 ng/L (ref <18)

## 2019-10-01 LAB — CBC
HCT: 37.3 % (ref 36.0–46.0)
Hemoglobin: 12.3 g/dL (ref 12.0–15.0)
MCH: 29.4 pg (ref 26.0–34.0)
MCHC: 33 g/dL (ref 30.0–36.0)
MCV: 89.2 fL (ref 80.0–100.0)
Platelets: 273 10*3/uL (ref 150–400)
RBC: 4.18 MIL/uL (ref 3.87–5.11)
RDW: 12.6 % (ref 11.5–15.5)
WBC: 5.8 10*3/uL (ref 4.0–10.5)
nRBC: 0 % (ref 0.0–0.2)

## 2019-10-01 LAB — LIPASE, BLOOD: Lipase: 25 U/L (ref 11–51)

## 2019-10-01 MED ORDER — POLYETHYLENE GLYCOL 3350 17 G PO PACK
17.0000 g | PACK | Freq: Every day | ORAL | Status: DC
  Administered 2019-10-01: 17 g via ORAL
  Filled 2019-10-01: qty 1

## 2019-10-01 MED ORDER — POLYETHYLENE GLYCOL 3350 17 G PO PACK: 17.0000 g | PACK | Freq: Every day | ORAL | 0 refills | Status: DC

## 2019-10-01 NOTE — ED Provider Notes (Signed)
[facility] Emergency Department Provider Note    First MD Initiated Contact with Patient 10/01/19 1858     (approximate)  I have reviewed the triage vital signs and the nursing notes.   HISTORY  Chief Complaint Abdominal Pain    HPI Ariana Jacobs is a 54 y.o. female below listed past medical history presents to the ER for evaluation of crampy diffuse abdominal pain.  States that she been having issues with irregular bowel movements and constipation.  States the pain was severe on arrival to the ER but that she did move her bowels while waiting in the waiting room with significant improvement in her pain.  She is currently pain-free.  Denies any fevers.  No nausea.  Is not currently on any laxatives.    Past Medical History:  Diagnosis Date  . Diabetes mellitus without complication (HCC)   . Hypertension    Family History  Problem Relation Age of Onset  . Healthy Mother   . Diabetes Father   . Breast cancer Neg Hx    Past Surgical History:  Procedure Laterality Date  . FOOT SURGERY     There are no problems to display for this patient.     Prior to Admission medications   Medication Sig Start Date End Date Taking? Authorizing Provider  amLODipine (NORVASC) 5 MG tablet  01/22/19   [provider]  HYDRALAZINE-HCTZ PO Take by mouth daily.    [provider]  LEVEMIR FLEXTOUCH 100 UNIT/ML Pen  02/12/19   [provider]  lisinopril-hydrochlorothiazide (ZESTORETIC) 20-25 MG tablet  01/08/19   [provider]  metFORMIN (GLUCOPHAGE) 1000 MG tablet 2 (two) times daily with a meal.  02/04/19   [provider]  polyethylene glycol (MIRALAX / GLYCOLAX) 17 g packet Take 17 g by mouth daily. Mix one tablespoon with 8oz of your favorite juice or water every day until you are having soft formed stools. Then start taking once daily if you didn't have a stool the day before. 10/01/19   Willy Eddy, MD  GLIPIZIDE  PO Take by mouth daily.  09/20/19  [provider]  hydrochlorothiazide (HYDRODIURIL) 25 MG tablet  01/01/19 09/20/19  [provider]  LISINOPRIL PO Take by mouth daily.  09/20/19  [provider]    Allergies Patient has no known allergies.    Social History Social History   Tobacco Use  . Smoking status: Never Smoker  . Smokeless tobacco: Never Used  Substance Use Topics  . Alcohol use: Yes    Comment: Occasionally  . Drug use: No    Review of Systems Patient denies headaches, rhinorrhea, blurry vision, numbness, shortness of breath, chest pain, edema, cough, abdominal pain, nausea, vomiting, diarrhea, dysuria, fevers, rashes or hallucinations unless otherwise stated above in HPI. ____________________________________________   PHYSICAL EXAM:  VITAL SIGNS: Vitals:   10/01/19 1640  BP: (!) 176/85  Pulse: 79  Resp: 16  Temp: 99.1 F (37.3 C)  SpO2: 98%    Constitutional: Alert and oriented.  Eyes: Conjunctivae are normal.  Head: Atraumatic. Nose: No congestion/rhinnorhea. Mouth/Throat: Mucous membranes are moist.   Neck: No stridor. Painless ROM.  Cardiovascular: Normal rate, regular rhythm. Grossly normal heart sounds.  Good peripheral circulation. Respiratory: Normal respiratory effort.  No retractions. Lungs CTAB. Gastrointestinal: Soft and nontender in all four quadrants. No distention. No abdominal bruits. No CVA tenderness. Genitourinary:  Musculoskeletal: No lower extremity tenderness nor edema.  No joint effusions. Neurologic:  Normal speech  and language. No gross focal neurologic deficits are appreciated. No facial droop Skin:  Skin is warm, dry and intact. No rash noted. Psychiatric: Mood and affect are normal. Speech and behavior are normal.  ____________________________________________   LABS (all labs ordered are listed, but only abnormal results are displayed)  Results for orders placed or performed during the hospital  encounter of 10/01/19 (from the past 24 hour(s))  Lipase, blood     Status: None   Collection Time: 10/01/19  4:45 PM  Result Value Ref Range   Lipase 25 11 - 51 U/L  Comprehensive metabolic panel     Status: Abnormal   Collection Time: 10/01/19  4:45 PM  Result Value Ref Range   Sodium 137 135 - 145 mmol/L   Potassium 4.1 3.5 - 5.1 mmol/L   Chloride 100 98 - 111 mmol/L   CO2 26 22 - 32 mmol/L   Glucose, Bld 472 (H) 70 - 99 mg/dL   BUN 12 6 - 20 mg/dL   Creatinine, Ser 0.73 0.44 - 1.00 mg/dL   Calcium 9.4 8.9 - 10.3 mg/dL   Total Protein 7.6 6.5 - 8.1 g/dL   Albumin 4.1 3.5 - 5.0 g/dL   AST 18 15 - 41 U/L   ALT 22 0 - 44 U/L   Alkaline Phosphatase 124 38 - 126 U/L   Total Bilirubin 0.4 0.3 - 1.2 mg/dL   GFR calc non Af Amer >60 >60 mL/min   GFR calc Af Amer >60 >60 mL/min   Anion gap 11 5 - 15  CBC     Status: None   Collection Time: 10/01/19  4:45 PM  Result Value Ref Range   WBC 5.8 4.0 - 10.5 K/uL   RBC 4.18 3.87 - 5.11 MIL/uL   Hemoglobin 12.3 12.0 - 15.0 g/dL   HCT 37.3 36.0 - 46.0 %   MCV 89.2 80.0 - 100.0 fL   MCH 29.4 26.0 - 34.0 pg   MCHC 33.0 30.0 - 36.0 g/dL   RDW 12.6 11.5 - 15.5 %   Platelets 273 150 - 400 K/uL   nRBC 0.0 0.0 - 0.2 %  Urinalysis, Complete w Microscopic     Status: Abnormal   Collection Time: 10/01/19  4:45 PM  Result Value Ref Range   Color, Urine STRAW (A) YELLOW   APPearance CLEAR (A) CLEAR   Specific Gravity, Urine 1.037 (H) 1.005 - 1.030   pH 6.0 5.0 - 8.0   Glucose, UA >=500 (A) NEGATIVE mg/dL   Hgb urine dipstick NEGATIVE NEGATIVE   Bilirubin Urine NEGATIVE NEGATIVE   Ketones, ur NEGATIVE NEGATIVE mg/dL   Protein, ur NEGATIVE NEGATIVE mg/dL   Nitrite NEGATIVE NEGATIVE   Leukocytes,Ua NEGATIVE NEGATIVE   RBC / HPF 0-5 0 - 5 RBC/hpf   WBC, UA 0-5 0 - 5 WBC/hpf   Bacteria, UA RARE (A) NONE SEEN   Squamous Epithelial / LPF 0-5 0 - 5  Troponin I (High Sensitivity)     Status: None   Collection Time: 10/01/19  4:45 PM  Result  Value Ref Range   Troponin I (High Sensitivity) <2 <18 ng/L   ____________________________________________ ____________________________________________  RADIOLOGY   ____________________________________________   PROCEDURES  Procedure(s) performed:  Procedures    Critical Care performed: no ____________________________________________   INITIAL IMPRESSION / ASSESSMENT AND PLAN / ED COURSE  Pertinent labs & imaging results that were available during my care of the patient were reviewed by me and considered in my medical decision making (  see chart for details).   DDX: Constipation, impaction, appendicitis, cholecystitis, cholelithiasis, pancreatitis  Ariana Jacobs is a 54 y.o. who presents to the ED with symptoms as described above resolving after moving her bowel.  She is completely benign abdominal exam.  She is afebrile no white count.  I do not feel that diagnostic imaging clinically indicated at this time.  I think she is appropriate for trial of outpatient management.  Discussed signs and symptoms for which she should return to the ER.  Patient was able to tolerate PO and was able to ambulate with a steady gait.  Have discussed with the patient and available family all diagnostics and treatments performed thus far and all questions were answered to the best of my ability. The patient demonstrates understanding and agreement with plan.      The patient was evaluated in Emergency Department today for the symptoms described in the history of present illness. He/she was evaluated in the context of the global COVID-19 pandemic, which necessitated consideration that the patient might be at risk for infection with the SARS-CoV-2 virus that causes COVID-19. Institutional protocols and algorithms that pertain to the evaluation of patients at risk for COVID-19 are in a state of rapid change based on information released by regulatory bodies including the CDC and federal and state  organizations. These policies and algorithms were followed during the patient's care in the ED.  As part of my medical decision making, I reviewed the following data within the electronic MEDICAL RECORD NUMBER Nursing notes reviewed and incorporated, Labs reviewed, notes from prior ED visits and [place] Controlled Substance Database   ____________________________________________   FINAL CLINICAL IMPRESSION(S) / ED DIAGNOSES  Final diagnoses:  Generalized abdominal pain      NEW MEDICATIONS STARTED DURING THIS VISIT:  New Prescriptions   POLYETHYLENE GLYCOL (MIRALAX / GLYCOLAX) 17 G PACKET    Take 17 g by mouth daily. Mix one tablespoon with 8oz of your favorite juice or water every day until you are having soft formed stools. Then start taking once daily if you didn't have a stool the day before.     Note:  This document was prepared using Dragon voice recognition software and may include unintentional dictation errors.    Willy Eddy, MD 10/01/19 1949

## 2019-10-01 NOTE — Discharge Instructions (Signed)

## 2019-10-01 NOTE — ED Triage Notes (Signed)
Pt reports uper abd pain that started this am. Pt describes the pain as pressure like in nature. Denies SOB, NV. Pt last BM was 2 days ago, states she tried to go today but could not.

## 2020-01-17 ENCOUNTER — Other Ambulatory Visit: Payer: Self-pay | Admitting: Family Medicine

## 2020-01-17 DIAGNOSIS — Z1231 Encounter for screening mammogram for malignant neoplasm of breast: Secondary | ICD-10-CM

## 2020-03-27 ENCOUNTER — Encounter: Payer: Self-pay | Admitting: Emergency Medicine

## 2020-03-27 ENCOUNTER — Ambulatory Visit
Admission: EM | Admit: 2020-03-27 | Discharge: 2020-03-27 | Disposition: A | Discharge status: Home / Self Care | Payer: 59 | Attending: Family Medicine | Admitting: Family Medicine

## 2020-03-27 ENCOUNTER — Other Ambulatory Visit: Payer: Self-pay

## 2020-03-27 DIAGNOSIS — M778 Other enthesopathies, not elsewhere classified: Secondary | ICD-10-CM | POA: Diagnosis not present

## 2020-03-27 NOTE — ED Provider Notes (Signed)
[facility]    CSN: [id number] Arrival date & time: 03/27/20  1136      History   Chief Complaint Chief Complaint  Patient presents with  . Hand Pain    right    HPI Ariana Jacobs is a 54 y.o. female.   54 yo female with a c/o right wrist pain since yesterday. Denies any fall or other traumatic injury. Denies any redness, fevers, chills, swelling, numbness/tingling.  States she's done a lot of heavy lifting recently.    Hand Pain    Past Medical History:  Diagnosis Date  . Diabetes mellitus without complication (HCC)   . Hypertension     There are no problems to display for this patient.   Past Surgical History:  Procedure Laterality Date  . FOOT SURGERY      OB History   No obstetric history on file.      Home Medications    Prior to Admission medications   Medication Sig Start Date End Date Taking? Authorizing Provider  amLODipine (NORVASC) 5 MG tablet  01/22/19  Yes [provider]  HYDRALAZINE-HCTZ PO Take by mouth daily.   Yes [provider]  LEVEMIR FLEXTOUCH 100 UNIT/ML Pen  02/12/19  Yes [provider]  lisinopril-hydrochlorothiazide (ZESTORETIC) 20-25 MG tablet  01/08/19  Yes [provider]  metFORMIN (GLUCOPHAGE) 1000 MG tablet 2 (two) times daily with a meal.  02/04/19  Yes [provider]  polyethylene glycol (MIRALAX / GLYCOLAX) 17 g packet Take 17 g by mouth daily. Mix one tablespoon with 8oz of your favorite juice or water every day until you are having soft formed stools. Then start taking once daily if you didn't have a stool the day before. 10/01/19   Willy Eddy, MD  GLIPIZIDE PO Take by mouth daily.  09/20/19  [provider]  hydrochlorothiazide (HYDRODIURIL) 25 MG tablet  01/01/19 09/20/19  [provider]  LISINOPRIL PO Take by mouth daily.  09/20/19  [provider]    Family History Family History  Problem Relation Age of Onset  . Healthy Mother    . Diabetes Father   . Breast cancer Neg Hx     Social History Social History   Tobacco Use  . Smoking status: Never Smoker  . Smokeless tobacco: Never Used  Vaping Use  . Vaping Use: Never used  Substance Use Topics  . Alcohol use: Yes    Comment: Occasionally  . Drug use: No     Allergies   Patient has no known allergies.   Review of Systems Review of Systems   Physical Exam Triage Vital Signs ED Triage Vitals  Enc Vitals Group     BP 03/27/20 1151 (!) 171/84     Pulse Rate 03/27/20 1151 81     Resp 03/27/20 1151 14     Temp 03/27/20 1151 98.3 F (36.8 C)     Temp Source 03/27/20 1151 Oral     SpO2 03/27/20 1151 100 %     Weight 03/27/20 1148 140 lb (63.5 kg)     Height 03/27/20 1148 4\' 11"  (1.499 m)     Head Circumference --      Peak Flow --      Pain Score 03/27/20 1148 7     Pain Loc --      Pain Edu? --      Excl. in GC? --    No data found.  Updated Vital Signs BP (!) 171/84 (BP  Location: Left Arm)   Pulse 81   Temp 98.3 F (36.8 C) (Oral)   Resp 14   Ht 4\' 11"  (1.499 m)   Wt 63.5 kg   SpO2 100%   BMI 28.28 kg/m   Visual Acuity Right Eye Distance:   Left Eye Distance:   Bilateral Distance:    Right Eye Near:   Left Eye Near:    Bilateral Near:     Physical Exam Vitals and nursing note reviewed.  Constitutional:      General: She is not in acute distress.    Appearance: She is not toxic-appearing or diaphoretic.  Musculoskeletal:     Right wrist: Tenderness (over the extensor and flexor tendons) present. No swelling, deformity, effusion, lacerations, bony tenderness, snuff box tenderness or crepitus. Normal range of motion. Normal pulse.     Comments: Right wrist/hand neurovascularly intact  Neurological:     Mental Status: She is alert.      UC Treatments / Results  Labs (all labs ordered are listed, but only abnormal results are displayed) Labs Reviewed - No data to display  EKG   Radiology No results  found.  Procedures Procedures (including critical care time)  Medications Ordered in UC Medications - No data to display  Initial Impression / Assessment and Plan / UC Course  I have reviewed the triage vital signs and the nursing notes.  Pertinent labs & imaging results that were available during my care of the patient were reviewed by me and considered in my medical decision making (see chart for details).      Final Clinical Impressions(s) / UC Diagnoses   Final diagnoses:  Tendonitis of wrist, right     Discharge Instructions     Rest, ice, elevation, wrist splint, over the counter ibuprofen or aleve    ED Prescriptions    None     1. diagnosis reviewed with patient 2. Recommend supportive treatment as above 3. Follow-up prn if symptoms worsen or don't improve  PDMP not reviewed this encounter.   , MD 03/27/20 1255

## 2020-03-27 NOTE — ED Triage Notes (Signed)
Patient c/o right hand pain that started yesterday.  Patient states that she picks up heavy things on a daily basis.  Patient denies injury or fall.

## 2020-03-27 NOTE — Discharge Instructions (Signed)
Rest, ice, elevation, wrist splint, over the counter ibuprofen or aleve

## 2020-04-07 ENCOUNTER — Encounter: Payer: Self-pay | Admitting: Emergency Medicine

## 2020-04-07 ENCOUNTER — Other Ambulatory Visit: Payer: Self-pay

## 2020-04-07 ENCOUNTER — Ambulatory Visit
Admission: EM | Admit: 2020-04-07 | Discharge: 2020-04-07 | Disposition: A | Discharge status: Home / Self Care | Payer: 59 | Attending: Emergency Medicine | Admitting: Emergency Medicine

## 2020-04-07 DIAGNOSIS — S39012A Strain of muscle, fascia and tendon of lower back, initial encounter: Secondary | ICD-10-CM | POA: Diagnosis not present

## 2020-04-07 MED ORDER — IBUPROFEN 600 MG PO TABS: 600.0000 mg | ORAL_TABLET | Freq: Four times a day (QID) | ORAL | 0 refills | Status: DC | PRN

## 2020-04-07 MED ORDER — TIZANIDINE HCL 4 MG PO TABS: 4.0000 mg | ORAL_TABLET | Freq: Three times a day (TID) | ORAL | 0 refills | Status: DC | PRN

## 2020-04-07 NOTE — ED Triage Notes (Signed)
Pt c/o lower back pain. Started about 4 days ago. She states she picked up her granddaughter and started hurting after that. She has tried heating pad and muscle relaxer.

## 2020-04-07 NOTE — Discharge Instructions (Addendum)
600 mg of ibuprofen combined with a Tylenol containing product 3 or 4 times a day.  Zanaflex for muscle spasms.  Do this on a regular basis for the next several days.   Many people find gentle stretching and deep tissue massage helpful. Follow-up with your primary care physician as needed, go to the ER for the signs and symptoms we discussed.  Go to www.goodrx.com to look up your medications. This will give you a list of where you can find your prescriptions at the most affordable prices. Or ask the pharmacist what the cash price is, or if they have any other discount programs available to help make your medication more affordable. This can be less expensive than what you would pay with insurance.

## 2020-04-07 NOTE — ED Provider Notes (Signed)
HPI  SUBJECTIVE:  Ariana Jacobs is a 54 y.o. female who presents with 4 days right lower back pain described as sore, stiffness.  It is intermittent, lasting for hours.  Reports radiation of the pain down the front of her leg.  States this started several hours after lifting her heavy grandchild.  She tried 400 mg ibuprofen once, an unknown medicine, muscle relaxants and heat.  Ibuprofen, muscle relaxant and heat help.  Symptoms are worse with movement, going from sitting to standing.  No N/V, fevers, flank pain, abdominal pain, urinary urgency, frequency, dysuria, cloudy or odorous urine, hematuria.  No syncope. No saddle anesthesia, distal weakness/numbness, bilateral radicular leg pain/weakness, fevers/night sweats neurological deficits,  bladder/ bowel incontinence, urinary retention, h/o CA / multiple myleoma, unexplained weight loss, pain worse at night, known AAA. no h/o nephrolithiasis. States feels similar to previous episodes of back pain, states like previous back strain.   Past medical history of well-controlled diabetes, on insulin, hypertension.  No history of chronic kidney disease, peptic ulcer disease, GI bleed.  PMD: [facility].  Past Medical History:  Diagnosis Date  . Diabetes mellitus without complication (HCC)   . Hypertension     Past Surgical History:  Procedure Laterality Date  . FOOT SURGERY      Family History  Problem Relation Age of Onset  . Healthy Mother   . Diabetes Father   . Breast cancer Neg Hx     Social History   Tobacco Use  . Smoking status: Never Smoker  . Smokeless tobacco: Never Used  Vaping Use  . Vaping Use: Never used  Substance Use Topics  . Alcohol use: Yes    Comment: Occasionally  . Drug use: No    No current facility-administered medications for this encounter.  Current Outpatient Medications:  .  LEVEMIR FLEXTOUCH 100 UNIT/ML Pen, , Disp: , Rfl:  .  lisinopril-hydrochlorothiazide (ZESTORETIC) 20-25 MG tablet, ,  Disp: , Rfl:  .  metFORMIN (GLUCOPHAGE) 1000 MG tablet, 2 (two) times daily with a meal. , Disp: , Rfl:  .  polyethylene glycol (MIRALAX / GLYCOLAX) 17 g packet, Take 17 g by mouth daily. Mix one tablespoon with 8oz of your favorite juice or water every day until you are having soft formed stools. Then start taking once daily if you didn't have a stool the day before., Disp: 30 each, Rfl: 0 .  amLODipine (NORVASC) 5 MG tablet, , Disp: , Rfl:  .  ibuprofen (ADVIL) 600 MG tablet, Take 1 tablet (600 mg total) by mouth every 6 (six) hours as needed., Disp: 30 tablet, Rfl: 0 .  tiZANidine (ZANAFLEX) 4 MG tablet, Take 1 tablet (4 mg total) by mouth every 8 (eight) hours as needed for muscle spasms., Disp: 30 tablet, Rfl: 0  No Known Allergies   ROS  As noted in HPI.   Physical Exam  BP (!) 141/78 (BP Location: Right Arm)   Pulse 82   Temp 98.2 F (36.8 C) (Oral)   Resp 18   Ht 4\' 11"  (1.499 m)   Wt 63.5 kg   SpO2 99%   BMI 28.27 kg/m   Constitutional: Well developed, well nourished, no acute distress Eyes:  EOMI, conjunctiva normal bilaterally HENT: Normocephalic, atraumatic,mucus membranes moist Respiratory: Normal inspiratory effort Cardiovascular: Normal rate GI: nondistended. No suprapubic tenderness skin: No rash, skin intact Musculoskeletal: no CVAT. + Right paralumbar tenderness, + R paralumbar muscle spasm. No bony tenderness. Bilateral lower extremities nontender, baseline ROM with intact  DP  pulses, pain with right hip flexion against resistance.  No pain with int/ext rotation flex/extension hips.  Positive right side SLR . sensation baseline light touch bilaterally for Pt, DTR's symmetric and intact bilaterally KJ, Motor symmetric bilateral 5/5 hip flexion, quadriceps, hamstrings, EHL, foot dorsiflexion, foot plantarflexion, gait somewhat antalgic but without apparent new ataxia. Neurologic: Alert & oriented x 3, no focal neuro deficits Psychiatric: Speech and behavior  appropriate   ED Course   Medications - No data to display  No orders of the defined types were placed in this encounter.   No results found for this or any previous visit (from the past 24 hour(s)). No results found.  ED Clinical Impression  1. Strain of lumbar region, initial encounter    ED Assessment/Plan  No urinary complaints. No evidence of spinal cord involvement based on H&P. Pt describing typical back pain, has been < 6 week duration. No historical red flags as noted in HPI. No physical red flags such as fever, bony tenderness, lower extremity weakness, saddle anesthesia. Imaging not indicated at this time.   Presentation consistent with musculoskeletal back strain/lumbar back strain.  Home with Tylenol/ibuprofen, Zanaflex, deep tissue massage.  Work note for 2 days.  Patient declined Medrol Dosepak after discussing glucose elevation with steroids.  Follow-up with PMD as needed, to the ER if she gets worse.   Discussed labs, medical decision-making, and plan for follow-up with the patient.  Discussed signs and symptoms that should prompt return to the emergency department.  Patient agrees with plan.  Meds ordered this encounter  Medications  . ibuprofen (ADVIL) 600 MG tablet    Sig: Take 1 tablet (600 mg total) by mouth every 6 (six) hours as needed.    Dispense:  30 tablet    Refill:  0  . tiZANidine (ZANAFLEX) 4 MG tablet    Sig: Take 1 tablet (4 mg total) by mouth every 8 (eight) hours as needed for muscle spasms.    Dispense:  30 tablet    Refill:  0    *This clinic note was created using Scientist, clinical (histocompatibility and immunogenetics). Therefore, there may be occasional mistakes despite careful proofreading.  ?     Domenick Gong, MD 04/07/20 1224

## 2020-04-27 ENCOUNTER — Ambulatory Visit
Admission: RE | Admit: 2020-04-27 | Discharge: 2020-04-27 | Disposition: A | Discharge status: Home / Self Care | Payer: 59 | Source: Ambulatory Visit | Attending: Family Medicine | Admitting: Family Medicine

## 2020-04-27 ENCOUNTER — Other Ambulatory Visit: Payer: Self-pay

## 2020-04-27 DIAGNOSIS — Z1231 Encounter for screening mammogram for malignant neoplasm of breast: Secondary | ICD-10-CM | POA: Insufficient documentation

## 2020-09-30 ENCOUNTER — Encounter: Payer: Self-pay | Admitting: Emergency Medicine

## 2020-09-30 ENCOUNTER — Other Ambulatory Visit: Payer: Self-pay

## 2020-09-30 ENCOUNTER — Ambulatory Visit (INDEPENDENT_AMBULATORY_CARE_PROVIDER_SITE_OTHER): Payer: 59

## 2020-09-30 ENCOUNTER — Ambulatory Visit
Admission: EM | Admit: 2020-09-30 | Discharge: 2020-09-30 | Disposition: A | Discharge status: Home / Self Care | Payer: 59 | Attending: Sports Medicine | Admitting: Sports Medicine

## 2020-09-30 DIAGNOSIS — K5900 Constipation, unspecified: Secondary | ICD-10-CM

## 2020-09-30 DIAGNOSIS — R1032 Left lower quadrant pain: Secondary | ICD-10-CM | POA: Diagnosis not present

## 2020-09-30 DIAGNOSIS — M545 Low back pain, unspecified: Secondary | ICD-10-CM | POA: Diagnosis present

## 2020-09-30 DIAGNOSIS — E1165 Type 2 diabetes mellitus with hyperglycemia: Secondary | ICD-10-CM

## 2020-09-30 LAB — GLUCOSE, CAPILLARY: Glucose-Capillary: 311 mg/dL — ABNORMAL HIGH (ref 70–99)

## 2020-09-30 LAB — URINALYSIS, COMPLETE (UACMP) WITH MICROSCOPIC
Bacteria, UA: NONE SEEN
Bilirubin Urine: NEGATIVE
Glucose, UA: >1000 mg/dL — AB
Hgb urine dipstick: NEGATIVE
Ketones, ur: NEGATIVE mg/dL
Leukocytes,Ua: NEGATIVE
Nitrite: NEGATIVE
Protein, ur: NEGATIVE mg/dL
Specific Gravity, Urine: 1.02 (ref 1.005–1.030)
WBC, UA: NONE SEEN WBC/hpf (ref 0–5)
pH: 7 (ref 5.0–8.0)

## 2020-09-30 MED ORDER — NAPROXEN 500 MG PO TABS: 500.0000 mg | ORAL_TABLET | Freq: Two times a day (BID) | ORAL | 0 refills | Status: AC

## 2020-09-30 MED ORDER — BACLOFEN 10 MG PO TABS: 10.0000 mg | ORAL_TABLET | Freq: Three times a day (TID) | ORAL | 0 refills | Status: AC | PRN

## 2020-09-30 NOTE — ED Triage Notes (Signed)
Pt c/o LLQ pain, and lower back pain. She states it is worse when she walks. Started a couple of days ago but worse today. Denies diarrhea, nausea or vomiting.

## 2020-09-30 NOTE — Discharge Instructions (Addendum)
BACK PAIN: Stressed avoiding painful activities . RICE (REST, ICE, COMPRESSION, ELEVATION) guidelines reviewed. May alternate ice and heat. Consider use of muscle rubs, Salonpas patches, etc. Use medications as directed including muscle relaxers if prescribed. Take anti-inflammatory medications as prescribed or OTC NSAIDs/Tylenol.  F/u with PCP in 7-10 days for reexamination, and please feel free to call or return to the urgent care at any time for any questions or concerns you may have and we will be happy to help you!   BACK PAIN RED FLAGS: If the back pain acutely worsens or there are any red flag symptoms such as numbness/tingling, leg weakness, saddle anesthesia, or loss of bowel/bladder control, go immediately to the ER. Follow up with [place] as scheduled or sooner if the pain does not begin to resolve or if it worsens before the follow up    CONSTIPATION: Your x-ray shows that you are constipated.  Increase your rest and fluids.  Add fiber to your diet.  Either use her suppositories or MiraLAX to facilitate bowel movements.  DIABETES: Your blood sugar is elevated at 311 in the clinic today.  Your diabetes is uncontrolled.  Keep your follow-up appointment with your PCP in the next 2 weeks.  Try to keep a log of your blood sugars to present to your PCP.    ABDOMINAL PAIN: You may take Tylenol for pain relief. Use medications as directed including antiemetics and antidiarrheal medications if suggested or prescribed. You should increase fluids and electrolytes as well as rest over these next several days. If you have any questions or concerns, or if your symptoms are not improving or if especially if they acutely worsen, please call or stop back to the clinic immediately and we will be happy to help you or go to the ER   ABDOMINAL PAIN RED FLAGS: Seek immediate further care if: symptoms remain the same or worsen over the next 3-7 days, you are unable to keep fluids down, you see blood or mucus in your  stool, you vomit black or dark red material, you have a fever of 101.F or higher, you have localized and/or persistent abdominal pain

## 2020-09-30 NOTE — ED Provider Notes (Signed)
[facility]    CSN: [id number] Arrival date & time: 09/30/20  1416      History   Chief Complaint Chief Complaint  Patient presents with  . Abdominal Pain  . Back Pain    HPI Ariana Jacobs is a 55 y.o. female presenting for 2-3 day history of left lower quadrant abdominal pain, left groin pain, and left sided lower back pain. Pain seems to be worse today. She says that her groin pain is worsened by standing, walking extending her back and raising her leg. Pain improves with rest. No worsening of symptoms with eating or drinking. Denies urinary symptoms or abnormal vaginal discharge.  Denies any nausea, vomiting or diarrhea.  Patient states that he has problems with chronic constipation and has to give herself suppositories frequently.  She states that she has been more constipated recently.  Last bowel movement was yesterday, but she says that it was with the effort of a suppository and she did not have a large BM.  Denies any blood or dark stools.  She has not had a fever.  She denies any injuries.  PMH significant for T2DM and hypertension.  Patient says she is aware that her diabetes is not well controlled and states that she was off medication for some time and just recently got back on her medication.  She takes Metformin and Levemir.  She admits that she does not check her blood sugar at home.  She states that she has a follow-up appointment with her PCP on October 12, 2020.  No other concerns.  HPI  Past Medical History:  Diagnosis Date  . Diabetes mellitus without complication (HCC)   . Hypertension     There are no problems to display for this patient.   Past Surgical History:  Procedure Laterality Date  . CESAREAN SECTION    . FOOT SURGERY      OB History   No obstetric history on file.      Home Medications    Prior to Admission medications   Medication Sig Start Date End Date Taking? Authorizing Provider  amLODipine (NORVASC) 5 MG tablet   01/22/19  Yes [provider]  baclofen (LIORESAL) 10 MG tablet Take 1 tablet (10 mg total) by mouth 3 (three) times daily as needed for up to 10 days for muscle spasms. 09/30/20 10/10/20 Yes Shirlee Latch, PA-C  LEVEMIR FLEXTOUCH 100 UNIT/ML Pen  02/12/19  Yes [provider]  lisinopril-hydrochlorothiazide (ZESTORETIC) 20-25 MG tablet  01/08/19  Yes [provider]  metFORMIN (GLUCOPHAGE) 1000 MG tablet 2 (two) times daily with a meal.  02/04/19  Yes [provider]  naproxen (NAPROSYN) 500 MG tablet Take 1 tablet (500 mg total) by mouth 2 (two) times daily for 15 days. 09/30/20 10/15/20 Yes Eusebio Friendly B, PA-C  atorvastatin (LIPITOR) 40 MG tablet TAKE 1 TABLET BY MOUTH AT BEDTIME FOR CHOLESTEROL. 09/14/20   [provider]  ibuprofen (ADVIL) 600 MG tablet Take 1 tablet (600 mg total) by mouth every 6 (six) hours as needed. 04/07/20   Domenick Gong, MD  polyethylene glycol (MIRALAX / GLYCOLAX) 17 g packet Take 17 g by mouth daily. Mix one tablespoon with 8oz of your favorite juice or water every day until you are having soft formed stools. Then start taking once daily if you didn't have a stool the day before. 10/01/19   Willy Eddy, MD  GLIPIZIDE PO Take by mouth daily.  09/20/19  [provider]  HYDRALAZINE-HCTZ PO Take by mouth daily.  04/07/20  [provider]  hydrochlorothiazide (HYDRODIURIL) 25 MG tablet  01/01/19 09/20/19  [provider]  LISINOPRIL PO Take by mouth daily.  09/20/19  [provider]    Family History Family History  Problem Relation Age of Onset  . Healthy Mother   . Diabetes Father   . Breast cancer Neg Hx     Social History Social History   Tobacco Use  . Smoking status: Never Smoker  . Smokeless tobacco: Never Used  Vaping Use  . Vaping Use: Never used  Substance Use Topics  . Alcohol use: Yes    Comment: Occasionally  . Drug use: No     Allergies   Patient has no known  allergies.   Review of Systems Review of Systems  Constitutional: Negative for appetite change, fatigue and fever.  Respiratory: Negative for shortness of breath.   Cardiovascular: Negative for chest pain.  Gastrointestinal: Positive for abdominal pain and constipation. Negative for blood in stool, diarrhea, nausea and vomiting.  Genitourinary: Negative for difficulty urinating, dysuria, frequency, hematuria, urgency, vaginal discharge and vaginal pain.  Musculoskeletal: Positive for back pain. Negative for gait problem and myalgias.  Neurological: Negative for weakness.     Physical Exam Triage Vital Signs ED Triage Vitals  Enc Vitals Group     BP 09/30/20 1436 139/82     Pulse Rate 09/30/20 1436 74     Resp 09/30/20 1436 18     Temp 09/30/20 1436 98.1 F (36.7 C)     Temp Source 09/30/20 1436 Oral     SpO2 09/30/20 1436 99 %     Weight 09/30/20 1434 139 lb 15.9 oz (63.5 kg)     Height 09/30/20 1434 4\' 11"  (1.499 m)     Head Circumference --      Peak Flow --      Pain Score 09/30/20 1434 6     Pain Loc --      Pain Edu? --      Excl. in GC? --    No data found.  Updated Vital Signs BP 139/82 (BP Location: Left Arm)   Pulse 74   Temp 98.1 F (36.7 C) (Oral)   Resp 18   Ht 4\' 11"  (1.499 m)   Wt 139 lb 15.9 oz (63.5 kg)   SpO2 99%   BMI 28.27 kg/m       Physical Exam Vitals and nursing note reviewed.  Constitutional:      General: She is not in acute distress.    Appearance: Normal appearance. She is not ill-appearing or toxic-appearing.  HENT:     Head: Normocephalic and atraumatic.     Nose: Nose normal.     Mouth/Throat:     Mouth: Mucous membranes are moist.     Pharynx: Oropharynx is clear.  Eyes:     General: No scleral icterus.       Right eye: No discharge.        Left eye: No discharge.     Conjunctiva/sclera: Conjunctivae normal.  Cardiovascular:     Rate and Rhythm: Normal rate and regular rhythm.     Heart sounds: Normal heart sounds.   Pulmonary:     Effort: Pulmonary effort is normal. No respiratory distress.     Breath sounds: Normal breath sounds.  Abdominal:     Palpations: Abdomen is soft.     Tenderness: There is abdominal tenderness (mild) in the left lower quadrant.  There is no right CVA tenderness or left CVA tenderness.  Musculoskeletal:     Cervical back: Neck supple.     Lumbar back: Spasms (left lumbar) and tenderness (left paralumbar region) present. Decreased range of motion. Negative right straight leg raise test and negative left straight leg raise test.  Skin:    General: Skin is dry.  Neurological:     General: No focal deficit present.     Mental Status: She is alert. Mental status is at baseline.     Motor: No weakness.     Gait: Gait normal.  Psychiatric:        Mood and Affect: Mood normal.        Behavior: Behavior normal.        Thought Content: Thought content normal.      UC Treatments / Results  Labs (all labs ordered are listed, but only abnormal results are displayed) Labs Reviewed  URINALYSIS, COMPLETE (UACMP) WITH MICROSCOPIC - Abnormal; Notable for the following components:      Result Value   Glucose, UA >1,000 (*)    All other components within normal limits  GLUCOSE, CAPILLARY - Abnormal; Notable for the following components:   Glucose-Capillary 311 (*)    All other components within normal limits  CBG MONITORING, ED    EKG   Radiology DG Abdomen 1 View  Result Date: 09/30/2020 CLINICAL DATA:  Left-sided abdominal pain EXAM: ABDOMEN - 1 VIEW COMPARISON:  10/30/2018 FINDINGS: Scattered large and small bowel gas is noted. Fecal material is noted throughout the colon consistent with a degree of constipation. No obstructive changes are seen. No calcifications are noted. No bony abnormality is seen. IMPRESSION: Mild constipation. Electronically Signed   By: Alcide Clever M.D.   On: 09/30/2020 15:30    Procedures Procedures (including critical care time)  Medications  Ordered in UC Medications - No data to display  Initial Impression / Assessment and Plan / UC Course  I have reviewed the triage vital signs and the nursing notes.  Pertinent labs & imaging results that were available during my care of the patient were reviewed by me and considered in my medical decision making (see chart for details).   1. Lower back pain: Urinalysis is only significant for greater than 1000 glucose.  No blood, leukocytes or nitrates.  No protein or ketones.  Back pain is worsened with extension of back.  Suspect musculoskeletal cause for the back pain.  Treating with naproxen and baclofen.  Advised she can also take Tylenol if needed for pain relief.  Advised supportive care with use of heating pad as well.  ED precautions for back pain discussed.  2. Left lower abdominal and groin pain: KUB obtained today which shows mild constipation.  Advised to continue suppositories at home and consider MiraLAX.  Advised to increase daily fiber exercise and fluids.  3. Glucosuria, hyperglycemia, uncontrolled T2DM: Fingerstick in the clinic was 311.  Advised her to continue taking medications as directed and follow-up with PCP at next appointment.  Advised to keep a log of her blood sugars so that she can take to her appointment.  Advise going to ED if she has any worsening abdominal pain, uncontrollable blood sugars, weakness or severe dehydration.   Final Clinical Impressions(s) / UC Diagnoses   Final diagnoses:  Acute left-sided low back pain without sciatica  Constipation, unspecified constipation type  Uncontrolled type 2 diabetes mellitus with hyperglycemia [facility])     Discharge Instructions  BACK PAIN: Stressed avoiding painful activities . RICE (REST, ICE, COMPRESSION, ELEVATION) guidelines reviewed. May alternate ice and heat. Consider use of muscle rubs, Salonpas patches, etc. Use medications as directed including muscle relaxers if prescribed. Take anti-inflammatory  medications as prescribed or OTC NSAIDs/Tylenol.  F/u with PCP in 7-10 days for reexamination, and please feel free to call or return to the urgent care at any time for any questions or concerns you may have and we will be happy to help you!   BACK PAIN RED FLAGS: If the back pain acutely worsens or there are any red flag symptoms such as numbness/tingling, leg weakness, saddle anesthesia, or loss of bowel/bladder control, go immediately to the ER. Follow up with [place] as scheduled or sooner if the pain does not begin to resolve or if it worsens before the follow up    CONSTIPATION: Your x-ray shows that you are constipated.  Increase your rest and fluids.  Add fiber to your diet.  Either use her suppositories or MiraLAX to facilitate bowel movements.  DIABETES: Your blood sugar is elevated at 311 in the clinic today.  Your diabetes is uncontrolled.  Keep your follow-up appointment with your PCP in the next 2 weeks.  Try to keep a log of your blood sugars to present to your PCP.    ABDOMINAL PAIN: You may take Tylenol for pain relief. Use medications as directed including antiemetics and antidiarrheal medications if suggested or prescribed. You should increase fluids and electrolytes as well as rest over these next several days. If you have any questions or concerns, or if your symptoms are not improving or if especially if they acutely worsen, please call or stop back to the clinic immediately and we will be happy to help you or go to the ER   ABDOMINAL PAIN RED FLAGS: Seek immediate further care if: symptoms remain the same or worsen over the next 3-7 days, you are unable to keep fluids down, you see blood or mucus in your stool, you vomit black or dark red material, you have a fever of 101.F or higher, you have localized and/or persistent abdominal pain      ED Prescriptions    Medication Sig Dispense Auth. Provider   naproxen (NAPROSYN) 500 MG tablet Take 1 tablet (500 mg total) by mouth 2 (two)  times daily for 15 days. 30 tablet Eusebio Friendly B, PA-C   baclofen (LIORESAL) 10 MG tablet Take 1 tablet (10 mg total) by mouth 3 (three) times daily as needed for up to 10 days for muscle spasms. 30 each Gareth Morgan     PDMP not reviewed this encounter.   Shirlee Latch, PA-C 09/30/20 [phone]

## 2021-03-06 ENCOUNTER — Other Ambulatory Visit: Payer: Self-pay

## 2021-03-06 ENCOUNTER — Ambulatory Visit
Admission: EM | Admit: 2021-03-06 | Discharge: 2021-03-06 | Disposition: A | Discharge status: Home / Self Care | Payer: 59 | Attending: Emergency Medicine | Admitting: Emergency Medicine

## 2021-03-06 DIAGNOSIS — S39012A Strain of muscle, fascia and tendon of lower back, initial encounter: Secondary | ICD-10-CM

## 2021-03-06 MED ORDER — IBUPROFEN 600 MG PO TABS: 600.0000 mg | ORAL_TABLET | Freq: Four times a day (QID) | ORAL | 0 refills | Status: DC | PRN

## 2021-03-06 MED ORDER — BACLOFEN 10 MG PO TABS: 10.0000 mg | ORAL_TABLET | Freq: Three times a day (TID) | ORAL | 0 refills | Status: DC

## 2021-03-06 NOTE — Discharge Instructions (Addendum)
Take the ibuprofen, 600 mg every 6 hours with food, on a schedule for the next 48 hours and then as needed.  Take the baclofen, 10 mg every 8 hours, as needed for low back muscle spasms.  Apply moist heat to your back for 30 minutes at a time 2-3 times a day to improve blood flow to the area and help remove the lactic acid causing the spasm.  Follow the back exercises given at discharge.  Return for reevaluation for any new or worsening symptoms.

## 2021-03-06 NOTE — ED Provider Notes (Signed)
[facility]    CSN: [id number] Arrival date & time: 03/06/21  1356      History   Chief Complaint Chief Complaint  Patient presents with   Back Pain    HPI Ariana Jacobs is a 55 y.o. female.   HPI  55 year old female here for evaluation of low back pain.  Patient reports that she is experiencing pain in her low back that will sometimes go down her right leg.  She is having to hold onto the wall to walk down the hallway.  She states that the station she is currently working at work requires her to lift heavy items up over her head frequently onto a cart and then pull a heavy cart every night.  She reports that this is a longstanding issue that she has been working with her boss to get put on a different station.  Patient reports that this is not a Workmen's Comp. complaint.  Past Medical History:  Diagnosis Date   Diabetes mellitus without complication (HCC)    Hypertension     There are no problems to display for this patient.   Past Surgical History:  Procedure Laterality Date   CESAREAN SECTION     FOOT SURGERY      OB History   No obstetric history on file.      Home Medications    Prior to Admission medications   Medication Sig Start Date End Date Taking? Authorizing Provider  amLODipine (NORVASC) 5 MG tablet  01/22/19  Yes [provider]  atorvastatin (LIPITOR) 40 MG tablet TAKE 1 TABLET BY MOUTH AT BEDTIME FOR CHOLESTEROL. 09/14/20  Yes [provider]  baclofen (LIORESAL) 10 MG tablet Take 1 tablet (10 mg total) by mouth 3 (three) times daily. 03/06/21  Yes Becky Augusta, NP  ibuprofen (ADVIL) 600 MG tablet Take 1 tablet (600 mg total) by mouth every 6 (six) hours as needed. 03/06/21  Yes Becky Augusta, NP  LEVEMIR FLEXTOUCH 100 UNIT/ML Pen  02/12/19  Yes [provider]  lisinopril-hydrochlorothiazide (ZESTORETIC) 20-25 MG tablet  01/08/19  Yes [provider]  metFORMIN (GLUCOPHAGE) 1000 MG tablet 2 (two) times  daily with a meal.  02/04/19  Yes [provider]  polyethylene glycol (MIRALAX / GLYCOLAX) 17 g packet Take 17 g by mouth daily. Mix one tablespoon with 8oz of your favorite juice or water every day until you are having soft formed stools. Then start taking once daily if you didn't have a stool the day before. 10/01/19  Yes Willy Eddy, MD  GLIPIZIDE PO Take by mouth daily.  09/20/19  [provider]  HYDRALAZINE-HCTZ PO Take by mouth daily.  04/07/20  [provider]  hydrochlorothiazide (HYDRODIURIL) 25 MG tablet  01/01/19 09/20/19  [provider]  LISINOPRIL PO Take by mouth daily.  09/20/19  [provider]    Family History Family History  Problem Relation Age of Onset   Healthy Mother    Diabetes Father    Breast cancer Neg Hx     Social History Social History   Tobacco Use   Smoking status: Never   Smokeless tobacco: Never  Vaping Use   Vaping Use: Never used  Substance Use Topics   Alcohol use: Yes    Comment: Occasionally   Drug use: No     Allergies   Patient has no known allergies.   Review of Systems Review of Systems  Musculoskeletal:  Positive for back pain.  Neurological:  Negative  for weakness and numbness.  Hematological: Negative.   Psychiatric/Behavioral: Negative.      Physical Exam Triage Vital Signs ED Triage Vitals  Enc Vitals Group     BP 03/06/21 1417 (!) 159/88     Pulse Rate 03/06/21 1417 73     Resp 03/06/21 1417 18     Temp 03/06/21 1417 97.8 F (36.6 C)     Temp Source 03/06/21 1417 Oral     SpO2 03/06/21 1417 100 %     Weight 03/06/21 1416 139 lb 15.9 oz (63.5 kg)     Height 03/06/21 1416 4\' 11"  (1.499 m)     Head Circumference --      Peak Flow --      Pain Score 03/06/21 1416 7     Pain Loc --      Pain Edu? --      Excl. in GC? --    No data found.  Updated Vital Signs BP (!) 159/88 (BP Location: Right Arm)   Pulse 73   Temp 97.8 F (36.6 C) (Oral)   Resp 18   Ht 4\' 11"   (1.499 m)   Wt 139 lb 15.9 oz (63.5 kg)   SpO2 100%   BMI 28.27 kg/m   Visual Acuity Right Eye Distance:   Left Eye Distance:   Bilateral Distance:    Right Eye Near:   Left Eye Near:    Bilateral Near:     Physical Exam Vitals and nursing note reviewed.  Constitutional:      General: She is not in acute distress.    Appearance: Normal appearance. She is not ill-appearing.  Cardiovascular:     Rate and Rhythm: Normal rate and regular rhythm.     Pulses: Normal pulses.     Heart sounds: Normal heart sounds. No murmur heard.   No gallop.  Pulmonary:     Effort: Pulmonary effort is normal.     Breath sounds: Normal breath sounds. No wheezing, rhonchi or rales.  Musculoskeletal:        General: Tenderness present. No swelling, deformity or signs of injury.  Skin:    General: Skin is warm and dry.     Capillary Refill: Capillary refill takes less than 2 seconds.     Findings: No erythema or rash.  Neurological:     General: No focal deficit present.     Mental Status: She is alert and oriented to person, place, and time.  Psychiatric:        Mood and Affect: Mood normal.        Behavior: Behavior normal.        Thought Content: Thought content normal.        Judgment: Judgment normal.     UC Treatments / Results  Labs (all labs ordered are listed, but only abnormal results are displayed) Labs Reviewed - No data to display  EKG   Radiology No results found.  Procedures Procedures (including critical care time)  Medications Ordered in UC Medications - No data to display  Initial Impression / Assessment and Plan / UC Course  I have reviewed the triage vital signs and the nursing notes.  Pertinent labs & imaging results that were available during my care of the patient were reviewed by me and considered in my medical decision making (see chart for details).  Patient is a nontoxic-appearing 55 year old female here for evaluation of low back pain that has been  an ongoing issue.  She has been  evaluated twice before this urgent care for low back pain.  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  She reports that she has been removed from this workstation multiple times and keeps getting put back on.  She states that she is too short for the cart she requires her to reach up over her head which is straining her low back as well as having to pull the heavy load.  Patient's physical exam reveals mild left lumbar paraspinous tenderness without spinal tenderness in the midline.  When attempting to assess lower extremity strength patient is not exhibiting effort into extension or flexion of her legs though has no problem with ambulation.  When checking DTRs patient holds her legs rigid despite being asked repeatedly to let her legs relax.  Patient's exam is unreliable and inconclusive based upon poor cooperation.  We will treat patient for her low back pain and palpable lumbar spasm with ibuprofen and baclofen.  Patient states that she is not due to go back to work until July 7 so does not need a work note.  We will also give patient low back exercises to do at home.   Final Clinical Impressions(s) / UC Diagnoses   Final diagnoses:  Strain of lumbar region, initial encounter     Discharge Instructions      Take the ibuprofen, 600 mg every 6 hours with food, on a schedule for the next 48 hours and then as needed.  Take the baclofen, 10 mg every 8 hours, as needed for low back muscle spasms.  Apply moist heat to your back for 30 minutes at a time 2-3 times a day to improve blood flow to the area and help remove the lactic acid causing the spasm.  Follow the back exercises given at discharge.  Return for reevaluation for any new or worsening symptoms.      ED Prescriptions     Medication Sig Dispense Auth. Provider   ibuprofen (ADVIL) 600 MG tablet Take 1  tablet (600 mg total) by mouth every 6 (six) hours as needed. 30 tablet Becky Augusta, NP   baclofen (LIORESAL) 10 MG tablet Take 1 tablet (10 mg total) by mouth 3 (three) times daily. 30 each Becky Augusta, NP      PDMP not reviewed this encounter.   Becky Augusta, NP 03/06/21 [phone]

## 2021-03-06 NOTE — ED Triage Notes (Signed)
Patient states that she has been having low back pain x 1 week ago. States that she is having muscle spasms.

## 2021-07-28 ENCOUNTER — Ambulatory Visit
Admission: EM | Admit: 2021-07-28 | Discharge: 2021-07-28 | Disposition: A | Discharge status: Home / Self Care | Payer: 59 | Attending: Emergency Medicine | Admitting: Emergency Medicine

## 2021-07-28 ENCOUNTER — Encounter: Payer: Self-pay | Admitting: Emergency Medicine

## 2021-07-28 ENCOUNTER — Other Ambulatory Visit: Payer: Self-pay

## 2021-07-28 DIAGNOSIS — R202 Paresthesia of skin: Secondary | ICD-10-CM | POA: Diagnosis not present

## 2021-07-28 DIAGNOSIS — G5603 Carpal tunnel syndrome, bilateral upper limbs: Secondary | ICD-10-CM

## 2021-07-28 MED ORDER — METHYLPREDNISOLONE 4 MG PO TBPK: ORAL_TABLET | ORAL | 0 refills | Status: DC

## 2021-07-28 NOTE — Discharge Instructions (Signed)
Take the Medrol dose pack according to the package instructions.  Wear the wrist braces at bedtime to keep your writs in a neutral position and prevent further inflammation of your nerves in your wrists.  Follow-up with your PCP. If your symptoms do not improve them you may need to see a hand surgery.

## 2021-07-28 NOTE — ED Provider Notes (Signed)
[facility]    CSN: [id number] Arrival date & time: 07/28/21  8921      History   Chief Complaint Chief Complaint  Patient presents with   Numbness    Right pinky    HPI Ariana Jacobs is a 55 y.o. female.   HPI  34 old female here for evaluation of numbness in her right fifth finger.  Patient reports that when she woke up this morning she had numbness in her right finger and it is still present.  She woke up about 8 AM this morning and is currently 10:08 AM.  Patient reports that she has intermittently experienced numbness in her hands and her middle and ring finger of both hands but that typically resolves.  She states that the numbness in her right finger is still present and has not changed.  Her hands are also cold but they are not red.  She has no history of Raynaud's.  Patient denies any weakness in her grip.  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  Past Medical History:  Diagnosis Date   Diabetes mellitus without complication (HCC)    Hypertension     There are no problems to display for this patient.   Past Surgical History:  Procedure Laterality Date   CESAREAN SECTION     FOOT SURGERY      OB History   No obstetric history on file.      Home Medications    Prior to Admission medications   Medication Sig Start Date End Date Taking? Authorizing Provider  amLODipine (NORVASC) 5 MG tablet  01/22/19  Yes [provider]  LEVEMIR FLEXTOUCH 100 UNIT/ML Pen  02/12/19  Yes [provider]  lisinopril-hydrochlorothiazide (ZESTORETIC) 20-25 MG tablet  01/08/19  Yes [provider]  metFORMIN (GLUCOPHAGE) 1000 MG tablet 2 (two) times daily with a meal.  02/04/19  Yes [provider]  methylPREDNISolone (MEDROL DOSEPAK) 4 MG TBPK tablet Take according to the package insert. 07/28/21  Yes Becky Augusta, NP  GLIPIZIDE PO Take by mouth daily.  09/20/19  [provider]  HYDRALAZINE-HCTZ PO  Take by mouth daily.  04/07/20  [provider]  hydrochlorothiazide (HYDRODIURIL) 25 MG tablet  01/01/19 09/20/19  [provider]  LISINOPRIL PO Take by mouth daily.  09/20/19  [provider]    Family History Family History  Problem Relation Age of Onset   Healthy Mother    Diabetes Father    Breast cancer Neg Hx     Social History Social History   Tobacco Use   Smoking status: Never   Smokeless tobacco: Never  Vaping Use   Vaping Use: Never used  Substance Use Topics   Alcohol use: Yes    Comment: Occasionally   Drug use: No     Allergies   Patient has no known allergies.   Review of Systems Review of Systems  Musculoskeletal:  Negative for arthralgias and joint swelling.  Skin:  Negative for color change.  Neurological:  Positive for numbness. Negative for weakness.  Hematological: Negative.   Psychiatric/Behavioral: Negative.      Physical Exam Triage Vital Signs ED Triage Vitals  Enc Vitals Group     BP 07/28/21 0926 (!) 166/83     Pulse Rate 07/28/21 0926 76     Resp 07/28/21 0926 18     Temp 07/28/21 0926 98.3 F (36.8 C)     Temp Source 07/28/21 0926 Oral  SpO2 07/28/21 0926 100 %     Weight 07/28/21 0922 139 lb 15.9 oz (63.5 kg)     Height 07/28/21 0922 4\' 11"  (1.499 m)     Head Circumference --      Peak Flow --      Pain Score 07/28/21 0922 0     Pain Loc --      Pain Edu? --      Excl. in GC? --    No data found.  Updated Vital Signs BP (!) 166/83 (BP Location: Left Arm)   Pulse 76   Temp 98.3 F (36.8 C) (Oral)   Resp 18   Ht 4\' 11"  (1.499 m)   Wt 139 lb 15.9 oz (63.5 kg)   SpO2 100%   BMI 28.27 kg/m   Visual Acuity Right Eye Distance:   Left Eye Distance:   Bilateral Distance:    Right Eye Near:   Left Eye Near:    Bilateral Near:     Physical Exam Vitals and nursing note reviewed.  Constitutional:      General: She is not in acute distress.    Appearance: Normal appearance. She is normal  weight. She is not ill-appearing.  HENT:     Head: Normocephalic and atraumatic.  Musculoskeletal:        General: No swelling, tenderness or deformity. Normal range of motion.  Skin:    General: Skin is warm and dry.     Capillary Refill: Capillary refill takes 2 to 3 seconds.     Findings: No bruising, erythema, lesion or rash.  Neurological:     General: No focal deficit present.     Mental Status: She is alert and oriented to person, place, and time.     Sensory: Sensory deficit present.     Motor: No weakness.     Comments: Patient has difficulty with sharp and dull sensation to termination in the fingers of both hands.  Patient has intact sensation in the palms of both hands and on the dorsum of the back of the hand of both hands.  Psychiatric:        Mood and Affect: Mood normal.        Behavior: Behavior normal.        Thought Content: Thought content normal.        Judgment: Judgment normal.     UC Treatments / Results  Labs (all labs ordered are listed, but only abnormal results are displayed) Labs Reviewed - No data to display  EKG   Radiology No results found.  Procedures Procedures (including critical care time)  Medications Ordered in UC Medications - No data to display  Initial Impression / Assessment and Plan / UC Course  I have reviewed the triage vital signs and the nursing notes.  Pertinent labs & imaging results that were available during my care of the patient were reviewed by me and considered in my medical decision making (see chart for details).  Patient is a nontoxic-appearing 55 year old female here for evaluation of paresthesias in her right fifth finger that were present for the past 2 hours.  She has had intermittent paresthesias in her middle and ring finger of both hands and is complaining of tingling in the palms of both hands at present.  She states that typically the symptoms will come and resolve quickly.  She denies any dizziness,  headache, visual changes, chest pain, or syncope.  No numbness of other parts of her extremities just in  her hands and fingers.  Physical exam reveals normal anatomical alignment of both hands and wrists.  Patient has full range of motion of her hands and her grips are equal bilaterally.  Patient's hands are cold to touch but there is no erythema to suggest Raynaud's.  She does have a slightly decreased capillary refill but I think it is secondary to her hands being cold.  Radial and ulnar pulses are 2+ bilaterally.  Patient has difficulty determining sharp dull sensation changes in the fingers of both hands but the sensation in the palms and the back of the hands are normal bilaterally.  Patient does not have a positive Tinel's test but does have a positive Phalen sign.  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  I believe her paresthesias are secondary to nerve impingement and even though she is diabetic I will use a low-dose steroid pack to see if her symptoms improve.  I have advised her that if her symptoms do not improve, or they worsen, she needs to follow-up with her primary care provider as she may need referral to a hand specialist for further evaluation   Final Clinical Impressions(s) / UC Diagnoses   Final diagnoses:  Bilateral carpal tunnel syndrome  Paresthesias     Discharge Instructions      Take the Medrol dose pack according to the package instructions.  Wear the wrist braces at bedtime to keep your writs in a neutral position and prevent further inflammation of your nerves in your wrists.  Follow-up with your PCP. If your symptoms do not improve them you may need to see a hand surgery.      ED Prescriptions     Medication Sig Dispense Auth. Provider   methylPREDNISolone (MEDROL  DOSEPAK) 4 MG TBPK tablet Take according to the package insert. 1 each Becky Augusta, NP      PDMP not reviewed this encounter.   Becky Augusta, NP 07/28/21 1008

## 2021-07-28 NOTE — ED Triage Notes (Addendum)
Pt c/o right pinky numbness. Started this morning when she woke up. She states she has tingling in both hands but the right pinky is the only one that is numb. Denies dizziness, headache or weakness. 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

## 2021-08-20 ENCOUNTER — Ambulatory Visit
Admission: EM | Admit: 2021-08-20 | Discharge: 2021-08-20 | Disposition: A | Discharge status: Home / Self Care | Payer: 59

## 2021-08-20 ENCOUNTER — Encounter: Payer: Self-pay | Admitting: Emergency Medicine

## 2021-08-20 ENCOUNTER — Other Ambulatory Visit: Payer: Self-pay

## 2021-08-20 DIAGNOSIS — J069 Acute upper respiratory infection, unspecified: Secondary | ICD-10-CM | POA: Diagnosis not present

## 2021-08-20 MED ORDER — BENZONATATE 100 MG PO CAPS: 200.0000 mg | ORAL_CAPSULE | Freq: Three times a day (TID) | ORAL | 0 refills | Status: DC

## 2021-08-20 MED ORDER — IPRATROPIUM BROMIDE 0.06 % NA SOLN: 2.0000 | Freq: Four times a day (QID) | NASAL | 12 refills | Status: DC

## 2021-08-20 MED ORDER — PROMETHAZINE-DM 6.25-15 MG/5ML PO SYRP: 5.0000 mL | ORAL_SOLUTION | Freq: Four times a day (QID) | ORAL | 0 refills | Status: DC | PRN

## 2021-08-20 NOTE — ED Provider Notes (Signed)
[facility]    CSN: [id number] Arrival date & time: 08/20/21  1052      History   Chief Complaint Chief Complaint  Patient presents with   Nasal Congestion   Otalgia   Headache   Cough    HPI Ariana Jacobs is a 55 y.o. female.   HPI  55 year old female here for evaluation of respiratory complaints.  Patient reports that for the last week she has been experiencing headache, nasal congestion with clear nasal discharge, decreased hearing in her left ear, sore throat, and a cough that is intermittently productive for clear sputum.  She denies fever, shortness breath or wheezing, GI complaints.  Past Medical History:  Diagnosis Date   Diabetes mellitus without complication (HCC)    Hypertension     There are no problems to display for this patient.   Past Surgical History:  Procedure Laterality Date   CESAREAN SECTION     FOOT SURGERY      OB History   No obstetric history on file.      Home Medications    Prior to Admission medications   Medication Sig Start Date End Date Taking? Authorizing Provider  amLODipine (NORVASC) 5 MG tablet  01/22/19  Yes [provider]  atorvastatin (LIPITOR) 40 MG tablet Take 40 mg by mouth daily as needed. 08/16/21  Yes [provider]  benzonatate (TESSALON) 100 MG capsule Take 2 capsules (200 mg total) by mouth every 8 (eight) hours. 08/20/21  Yes Becky Augusta, NP  ipratropium (ATROVENT) 0.06 % nasal spray Place 2 sprays into both nostrils 4 (four) times daily. 08/20/21  Yes Becky Augusta, NP  LEVEMIR FLEXTOUCH 100 UNIT/ML Pen  02/12/19  Yes [provider]  lisinopril-hydrochlorothiazide (ZESTORETIC) 20-25 MG tablet  01/08/19  Yes [provider]  metFORMIN (GLUCOPHAGE) 1000 MG tablet 2 (two) times daily with a meal.  02/04/19  Yes [provider]  promethazine-dextromethorphan (PROMETHAZINE-DM) 6.25-15 MG/5ML syrup Take 5 mLs by mouth 4 (four) times daily as needed. 08/20/21   Yes Becky Augusta, NP  GLIPIZIDE PO Take by mouth daily.  09/20/19  [provider]  HYDRALAZINE-HCTZ PO Take by mouth daily.  04/07/20  [provider]  hydrochlorothiazide (HYDRODIURIL) 25 MG tablet  01/01/19 09/20/19  [provider]  LISINOPRIL PO Take by mouth daily.  09/20/19  [provider]    Family History Family History  Problem Relation Age of Onset   Healthy Mother    Diabetes Father    Breast cancer Neg Hx     Social History Social History   Tobacco Use   Smoking status: Never   Smokeless tobacco: Never  Vaping Use   Vaping Use: Never used  Substance Use Topics   Alcohol use: Yes    Comment: Occasionally   Drug use: No     Allergies   Patient has no known allergies.   Review of Systems Review of Systems  Constitutional:  Negative for activity change, appetite change and fever.  HENT:  Positive for congestion, hearing loss, rhinorrhea and sore throat. Negative for ear pain.   Respiratory:  Positive for cough. Negative for shortness of breath and wheezing.   Gastrointestinal:  Negative for diarrhea, nausea and vomiting.  Skin:  Negative for rash.  Neurological:  Positive for headaches.  Hematological: Negative.   Psychiatric/Behavioral: Negative.      Physical Exam Triage Vital Signs ED Triage Vitals  Enc Vitals Group     BP 08/20/21 1242 Marland Kitchen)  162/78     Pulse Rate 08/20/21 1242 76     Resp 08/20/21 1242 14     Temp 08/20/21 1242 97.6 F (36.4 C)     Temp Source 08/20/21 1242 Oral     SpO2 08/20/21 1242 99 %     Weight 08/20/21 1238 144 lb (65.3 kg)     Height 08/20/21 1238 4\' 11"  (1.499 m)     Head Circumference --      Peak Flow --      Pain Score 08/20/21 1238 7     Pain Loc --      Pain Edu? --      Excl. in [place]? --    No data found.  Updated Vital Signs BP (!) 162/78 (BP Location: Left Arm)    Pulse 76    Temp 97.6 F (36.4 C) (Oral)    Resp 14    Ht 4\' 11"  (1.499 m)    Wt 144 lb (65.3 kg)    SpO2 99%     BMI 29.08 kg/m   Visual Acuity Right Eye Distance:   Left Eye Distance:   Bilateral Distance:    Right Eye Near:   Left Eye Near:    Bilateral Near:     Physical Exam Vitals and nursing note reviewed.  Constitutional:      General: She is not in acute distress.    Appearance: Normal appearance. She is not ill-appearing.  HENT:     Head: Normocephalic and atraumatic.     Right Ear: Tympanic membrane, ear canal and external ear normal. There is no impacted cerumen.     Left Ear: Tympanic membrane, ear canal and external ear normal. There is no impacted cerumen.     Nose: Congestion and rhinorrhea present.     Mouth/Throat:     Mouth: Mucous membranes are moist.     Pharynx: Oropharynx is clear. Posterior oropharyngeal erythema present.  Cardiovascular:     Rate and Rhythm: Normal rate and regular rhythm.     Pulses: Normal pulses.     Heart sounds: Normal heart sounds. No murmur heard.   No gallop.  Pulmonary:     Effort: Pulmonary effort is normal.     Breath sounds: Normal breath sounds. No wheezing, rhonchi or rales.  Musculoskeletal:     Cervical back: Normal range of motion and neck supple.  Lymphadenopathy:     Cervical: No cervical adenopathy.  Skin:    General: Skin is warm and dry.     Capillary Refill: Capillary refill takes less than 2 seconds.     Findings: No erythema or rash.  Neurological:     General: No focal deficit present.     Mental Status: She is alert and oriented to person, place, and time.  Psychiatric:        Mood and Affect: Mood normal.        Behavior: Behavior normal.        Thought Content: Thought content normal.        Judgment: Judgment normal.     UC Treatments / Results  Labs (all labs ordered are listed, but only abnormal results are displayed) Labs Reviewed - No data to display  EKG   Radiology No results found.  Procedures Procedures (including critical care time)  Medications Ordered in UC Medications - No data  to display  Initial Impression / Assessment and Plan / UC Course  I have reviewed the triage vital signs and the  nursing notes.  Pertinent labs & imaging results that were available during my care of the patient were reviewed by me and considered in my medical decision making (see chart for details).  Patient is a nontoxic-appearing 55 year old female here for evaluation of respiratory complaints as outlined in HPI above.  Patient's physical exam reveals pearly gray tympanic membranes bilaterally with normal light reflex and clear external auditory canals.  Nasal mucosa is erythematous and edematous with clear nasal discharge in both nares.  Oropharyngeal exam reveals mild posterior oropharyngeal erythema with clear postnasal drip.  No cervical lymphadenopathy appreciated exam.  Cardiopulmonary exam reveals clear lung sounds in all fields.  Patient exam is consistent with a viral URI with cough and I will treat her with Atrovent nasal spray, Tessalon Perles, Promethazine DM cough syrup.  I do not believe an antibiotic is warranted at this time.  Patient is in no acute distress.   Final Clinical Impressions(s) / UC Diagnoses   Final diagnoses:  Viral URI with cough     Discharge Instructions      Use the Atrovent nasal spray, 2 squirts in each nostril every 6 hours, as needed for runny nose and postnasal drip.  Use the Tessalon Perles every 8 hours during the day.  Take them with a small sip of water.  They may give you some numbness to the base of your tongue or a metallic taste in your mouth, this is normal.  Use the Promethazine DM cough syrup at bedtime for cough and congestion.  It will make you drowsy so do not take it during the day.  Return for reevaluation or see your primary care provider for any new or worsening symptoms.      ED Prescriptions     Medication Sig Dispense Auth. Provider   benzonatate (TESSALON) 100 MG capsule Take 2 capsules (200 mg total) by mouth every 8  (eight) hours. 21 capsule Becky Augusta, NP   ipratropium (ATROVENT) 0.06 % nasal spray Place 2 sprays into both nostrils 4 (four) times daily. 15 mL Becky Augusta, NP   promethazine-dextromethorphan (PROMETHAZINE-DM) 6.25-15 MG/5ML syrup Take 5 mLs by mouth 4 (four) times daily as needed. 118 mL Becky Augusta, NP      PDMP not reviewed this encounter.   Becky Augusta, NP 08/20/21 [phone]

## 2021-08-20 NOTE — ED Triage Notes (Signed)
Patient c/o nasal congestion, runny nose, cough, headache that started a week.  Patient denies fevers.

## 2021-08-20 NOTE — Discharge Instructions (Signed)

## 2021-10-11 ENCOUNTER — Ambulatory Visit
Admission: EM | Admit: 2021-10-11 | Discharge: 2021-10-11 | Disposition: A | Discharge status: Home / Self Care | Payer: 59 | Attending: Emergency Medicine | Admitting: Emergency Medicine

## 2021-10-11 ENCOUNTER — Other Ambulatory Visit: Payer: Self-pay

## 2021-10-11 DIAGNOSIS — J069 Acute upper respiratory infection, unspecified: Secondary | ICD-10-CM

## 2021-10-11 MED ORDER — AMOXICILLIN-POT CLAVULANATE 875-125 MG PO TABS: 1.0000 | ORAL_TABLET | Freq: Two times a day (BID) | ORAL | 0 refills | Status: AC

## 2021-10-11 MED ORDER — IPRATROPIUM BROMIDE 0.06 % NA SOLN: 2.0000 | Freq: Four times a day (QID) | NASAL | 12 refills | Status: DC

## 2021-10-11 NOTE — ED Provider Notes (Signed)
[facility]    CSN: [id number] Arrival date & time: 10/11/21  1536      History   Chief Complaint Chief Complaint  Patient presents with   Nasal Congestion    HPI Ariana Jacobs is a 56 y.o. female.   HPI  56 year old female here for evaluation of respiratory complaints.  Patient reports that for the last week she has been experiencing sinus pressure with yellow nasal discharge, ear pressure, and intermittent productive cough for yellow sputum.  She also endorses that she experiencing fatigue today.  She denies any fever, sore throat, shortness of breath, wheezing, or GI complaints.  Past Medical History:  Diagnosis Date   Diabetes mellitus without complication ([place])    Hypertension      There are no problems to display for this patient.   Past Surgical History:  Procedure Laterality Date   CESAREAN SECTION     FOOT SURGERY      OB History   No obstetric history on file.      Home Medications    Prior to Admission medications   Medication Sig Start Date End Date Taking? Authorizing Provider  amoxicillin-clavulanate (AUGMENTIN) 875-125 MG tablet Take 1 tablet by mouth every 12 (twelve) hours for 10 days. 10/11/21 10/21/21 Yes Margarette Canada, NP  ipratropium (ATROVENT) 0.06 % nasal spray Place 2 sprays into both nostrils 4 (four) times daily. 10/11/21  Yes Margarette Canada, NP  amLODipine (NORVASC) 5 MG tablet  01/22/19   [provider]  atorvastatin (LIPITOR) 40 MG tablet Take 40 mg by mouth daily as needed. 08/16/21   [provider]  LEVEMIR FLEXTOUCH 100 UNIT/ML Pen  02/12/19   [provider]  lisinopril-hydrochlorothiazide (ZESTORETIC) 20-25 MG tablet  01/08/19   [provider]  metFORMIN (GLUCOPHAGE) 1000 MG tablet 2 (two) times daily with a meal.  02/04/19   [provider]  GLIPIZIDE PO Take by mouth daily.  09/20/19  [provider]  HYDRALAZINE-HCTZ PO Take by mouth daily.  04/07/20  [provider]  hydrochlorothiazide (HYDRODIURIL) 25 MG tablet  01/01/19 09/20/19  [provider]  LISINOPRIL PO Take by mouth daily.  09/20/19  [provider]    Family History Family History  Problem Relation Age of Onset   Healthy Mother    Diabetes Father    Breast cancer Neg Hx     Social History Social History   Tobacco Use   Smoking status: Never   Smokeless tobacco: Never  Vaping Use   Vaping Use: Never used  Substance Use Topics   Alcohol use: Yes    Comment: Occasionally   Drug use: No     Allergies   Patient has no known allergies.   Review of Systems Review of Systems  Constitutional:  Negative for fever.  HENT:  Positive for congestion, ear pain, rhinorrhea and sinus pressure. Negative for sore throat.   Respiratory:  Positive for cough. Negative for shortness of breath and wheezing.   Gastrointestinal:  Negative for diarrhea and nausea.  Skin:  Negative for rash.  Hematological: Negative.   Psychiatric/Behavioral: Negative.      Physical Exam Triage Vital Signs ED Triage Vitals  Enc Vitals Group     BP 10/11/21 1602 110/66     Pulse Rate 10/11/21 1602 89     Resp 10/11/21 1602 18     Temp 10/11/21 1602 98.7 F (37.1 C)     Temp Source 10/11/21 1602 Oral  SpO2 10/11/21 1602 100 %     Weight 10/11/21 1556 144 lb (65.3 kg)     Height 10/11/21 1556 4' 11"  (1.499 m)     Head Circumference --      Peak Flow --      Pain Score 10/11/21 1601 8     Pain Loc --      Pain Edu? --      Excl. in [place]? --    No data found.  Updated Vital Signs BP 110/66 (BP Location: Left Arm)    Pulse 89    Temp 98.7 F (37.1 C) (Oral)    Resp 18    Ht 4' 11"  (1.499 m)    Wt 144 lb (65.3 kg)    SpO2 100%    BMI 29.08 kg/m   Visual Acuity Right Eye Distance:   Left Eye Distance:   Bilateral Distance:    Right Eye Near:   Left Eye Near:    Bilateral Near:     Physical Exam Vitals and nursing note reviewed.  Constitutional:       General: She is not in acute distress.    Appearance: Normal appearance. She is not ill-appearing.  HENT:     Head: Normocephalic and atraumatic.     Right Ear: Tympanic membrane, ear canal and external ear normal. There is no impacted cerumen.     Left Ear: Tympanic membrane, ear canal and external ear normal. There is no impacted cerumen.     Nose: Congestion and rhinorrhea present.     Mouth/Throat:     Mouth: Mucous membranes are moist.     Pharynx: Oropharynx is clear. No posterior oropharyngeal erythema.  Cardiovascular:     Rate and Rhythm: Normal rate and regular rhythm.     Pulses: Normal pulses.     Heart sounds: Normal heart sounds. No murmur heard.   No friction rub. No gallop.  Pulmonary:     Effort: Pulmonary effort is normal.     Breath sounds: Normal breath sounds. No wheezing, rhonchi or rales.  Musculoskeletal:     Cervical back: Normal range of motion and neck supple.  Lymphadenopathy:     Cervical: No cervical adenopathy.  Skin:    General: Skin is warm and dry.     Capillary Refill: Capillary refill takes less than 2 seconds.     Findings: No erythema or rash.  Neurological:     General: No focal deficit present.     Mental Status: She is alert and oriented to person, place, and time.  Psychiatric:        Mood and Affect: Mood normal.        Behavior: Behavior normal.        Thought Content: Thought content normal.        Judgment: Judgment normal.     UC Treatments / Results  Labs (all labs ordered are listed, but only abnormal results are displayed) Labs Reviewed - No data to display  EKG   Radiology No results found.  Procedures Procedures (including critical care time)  Medications Ordered in UC Medications - No data to display  Initial Impression / Assessment and Plan / UC Course  I have reviewed the triage vital signs and the nursing notes.  Pertinent labs & imaging results that were available during my care of the patient were  reviewed by me and considered in my medical decision making (see chart for details).  Patient is a nontoxic-appearing 56 year old female here  for evaluation of 1 weeks worth of sinus pressure with yellow nasal discharge, intermittent productive cough for yellow sputum, ear pressure, and fatigue.  Her physical exam reveals pearly gray tympanic membranes bilaterally with normal light reflex and clear external auditory canals.  Nasal mucosa is erythematous and edematous with yellow discharge in both nares.  Patient doesn't have tenderness to percussion of frontal and maxillary sinuses.  Oropharyngeal exam is benign.  No cervical lymphadenopathy appreciated on exam.  Cardiopulmonary exam feels clung sounds in all fields.  Patient exam is consistent with an upper respiratory infection.  Given her.  The discharge and fatigue there is concern this may be bacterial in origin.  Will give trial of Augmentin twice daily for 10 days and Atrovent nasal spray to help with nasal congestion.  Have also encouraged patient to perform sinus irrigation to help wash out nasal mucus to help her with her congestion symptoms.  Provided.   Final Clinical Impressions(s) / UC Diagnoses   Final diagnoses:  Upper respiratory tract infection, unspecified type     Discharge Instructions      The Augmentin twice daily with food for 10 days for treatment of your URI.  Use the Atrovent nasal spray, 2 squirts in each nostril every 6 hours, as needed for nasal congestion.  Perform sinus irrigation 2-3 times a day with a [employer] sinus rinse kit and distilled water.  Do not use tap water.  You can use plain over-the-counter Mucinex every 6 hours to break up the stickiness of the mucus so your body can clear it.  Increase your oral fluid intake to thin out your mucus so that is also able for your body to clear more easily.  Take an over-the-counter probiotic, such as Culturelle-align-activia, 1 hour after each dose of antibiotic  to prevent diarrhea.  If you develop any new or worsening symptoms return for reevaluation or see your primary care provider.      ED Prescriptions     Medication Sig Dispense Auth. Provider   amoxicillin-clavulanate (AUGMENTIN) 875-125 MG tablet Take 1 tablet by mouth every 12 (twelve) hours for 10 days. 20 tablet Margarette Canada, NP   ipratropium (ATROVENT) 0.06 % nasal spray Place 2 sprays into both nostrils 4 (four) times daily. 15 mL Margarette Canada, NP      PDMP not reviewed this encounter.   Margarette Canada, NP 10/11/21 [phone]

## 2021-10-11 NOTE — ED Triage Notes (Signed)
Pt here with C/O sinus congestion, facial pressure, for 1 week. Denies fever. Did leave work early because she felt very fatigue.

## 2021-10-11 NOTE — Discharge Instructions (Signed)
The Augmentin twice daily with food for 10 days for treatment of your URI.  Use the Atrovent nasal spray, 2 squirts in each nostril every 6 hours, as needed for nasal congestion.  Perform sinus irrigation 2-3 times a day with a [employer]Med sinus rinse kit and distilled water.  Do not use tap water.  You can use plain over-the-counter Mucinex every 6 hours to break up the stickiness of the mucus so your body can clear it.  Increase your oral fluid intake to thin out your mucus so that is also able for your body to clear more easily.  Take an over-the-counter probiotic, such as Culturelle-align-activia, 1 hour after each dose of antibiotic to prevent diarrhea.  If you develop any new or worsening symptoms return for reevaluation or see your primary care provider.

## 2022-01-12 ENCOUNTER — Emergency Department
Admission: EM | Admit: 2022-01-12 | Discharge: 2022-01-12 | Disposition: A | Discharge status: Home / Self Care | Payer: 59 | Attending: Emergency Medicine | Admitting: Emergency Medicine

## 2022-01-12 DIAGNOSIS — Z5321 Procedure and treatment not carried out due to patient leaving prior to being seen by health care provider: Secondary | ICD-10-CM | POA: Diagnosis not present

## 2022-01-12 DIAGNOSIS — K649 Unspecified hemorrhoids: Secondary | ICD-10-CM | POA: Diagnosis present

## 2022-01-12 DIAGNOSIS — K644 Residual hemorrhoidal skin tags: Secondary | ICD-10-CM | POA: Diagnosis not present

## 2022-01-12 NOTE — ED Triage Notes (Addendum)
56 y/o female arrived to the [facility] with a CC of Hemorrhoids. PT states she has been straining a lot and has irritated her external hemorrhoids. PT notes minimal blood coming from hemorrhoids. Pt denies feelings of passing out or dizziness. Pt denies being on any blood thinners. Last BM was today. PT states over the counter solutions for her hemorrhoids haven't been helping.   ?

## 2022-01-12 NOTE — ED Notes (Signed)
No answer when called several times from lobby 

## 2022-03-24 IMAGING — MG DIGITAL SCREENING BILAT W/ TOMO W/ CAD
8 series · 8 of 24 positions shown · non-contrast
Comparison: Previous exam(s).

CLINICAL DATA: Screening.

EXAM:
DIGITAL SCREENING BILATERAL MAMMOGRAM WITH TOMO AND CAD

[L MLO synth-2D]
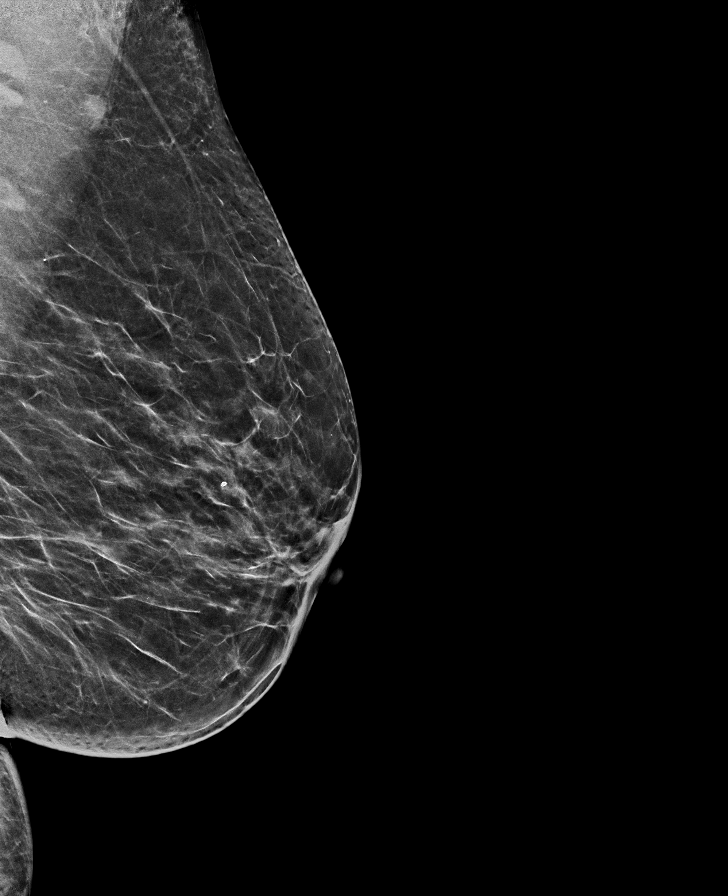

[L CC synth-2D]
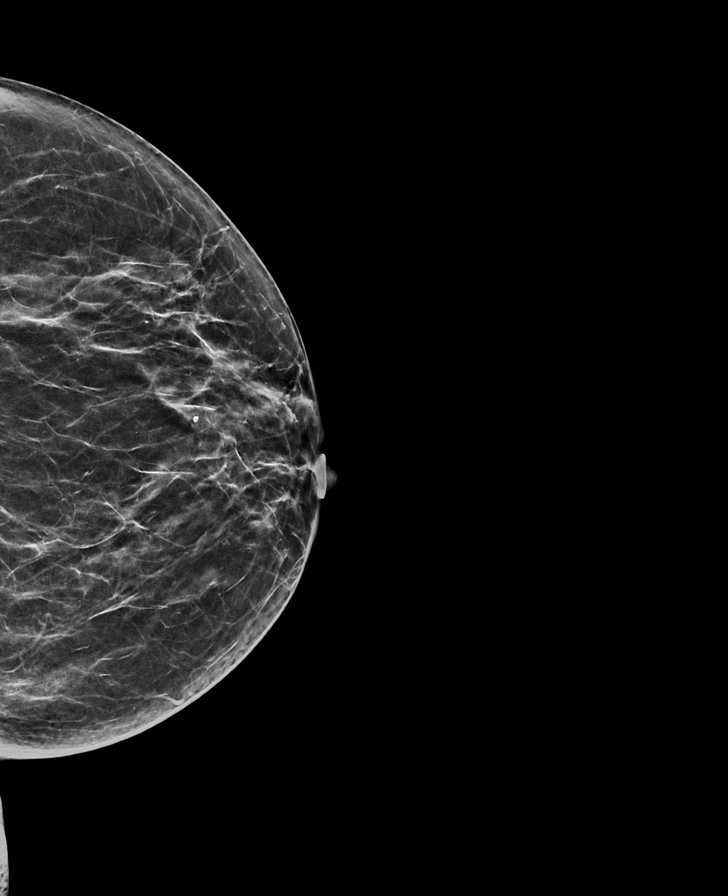

[R CC synth-2D]
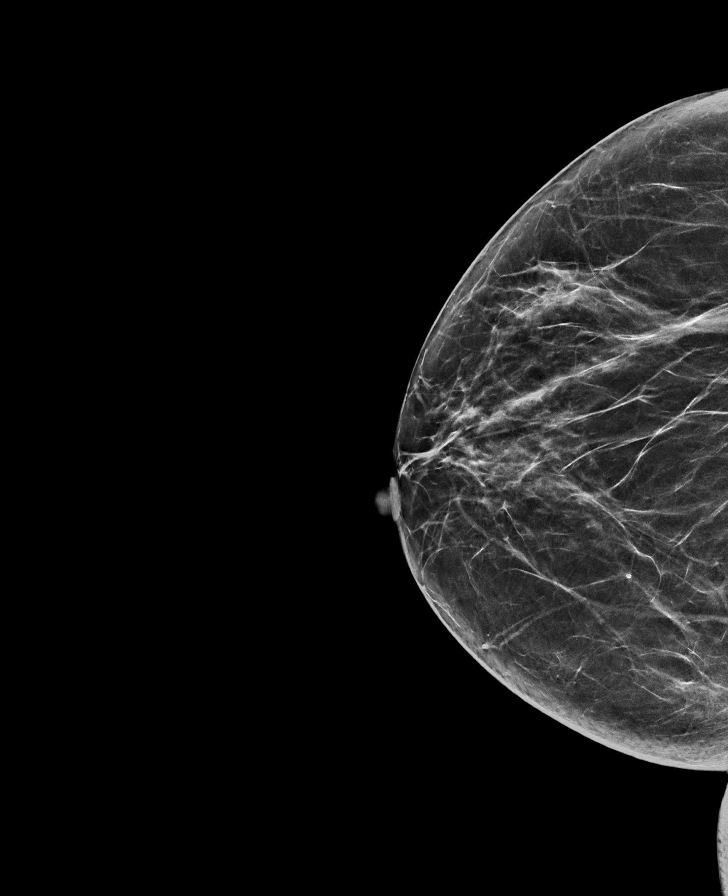

[R MLO synth-2D]
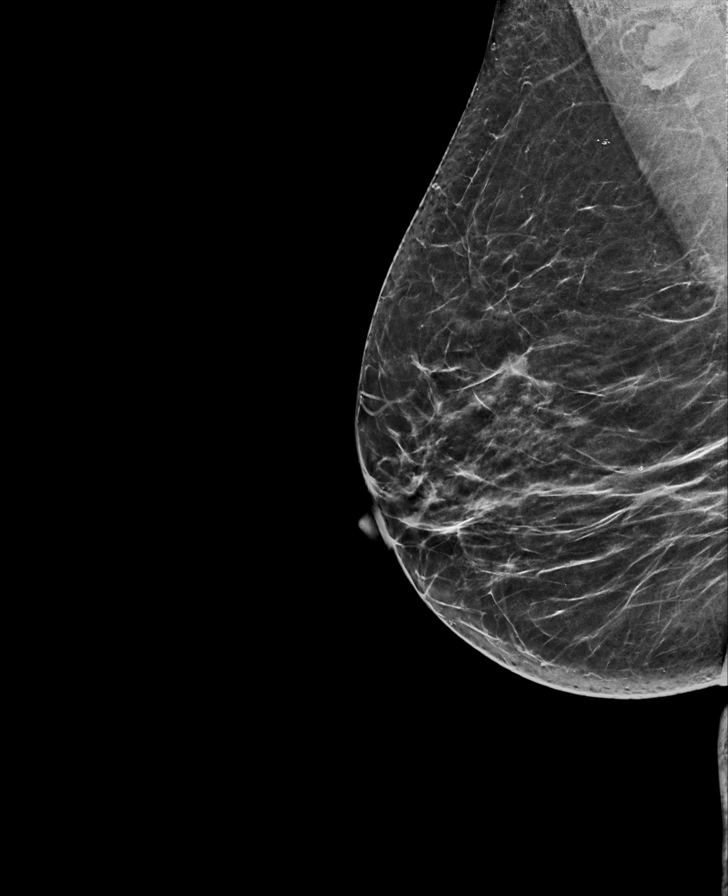

[L CC tomo · tomo slice 31/60.0]
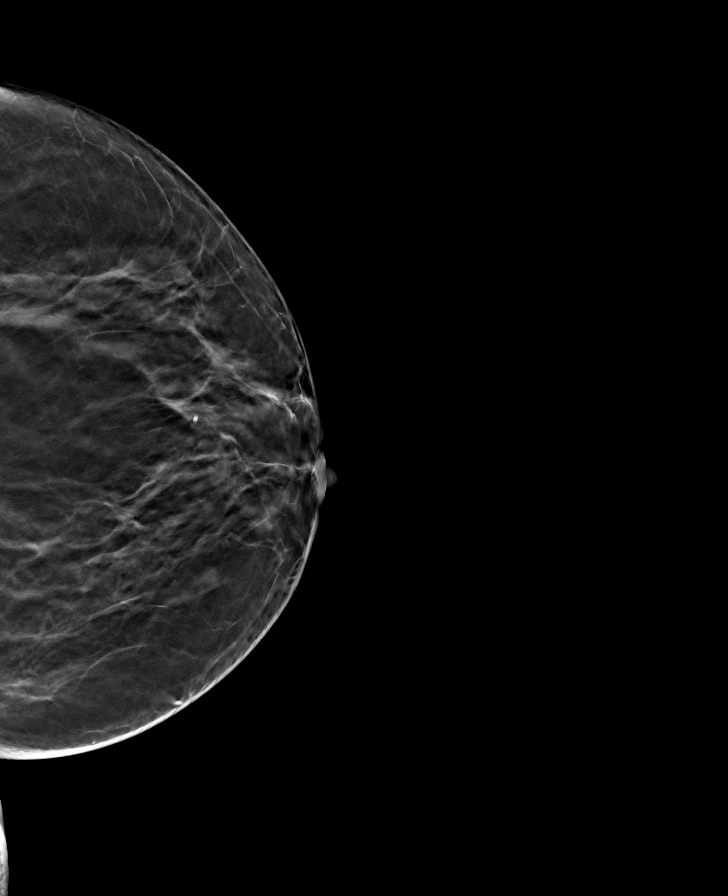

[R CC tomo · tomo slice 32/63.0]
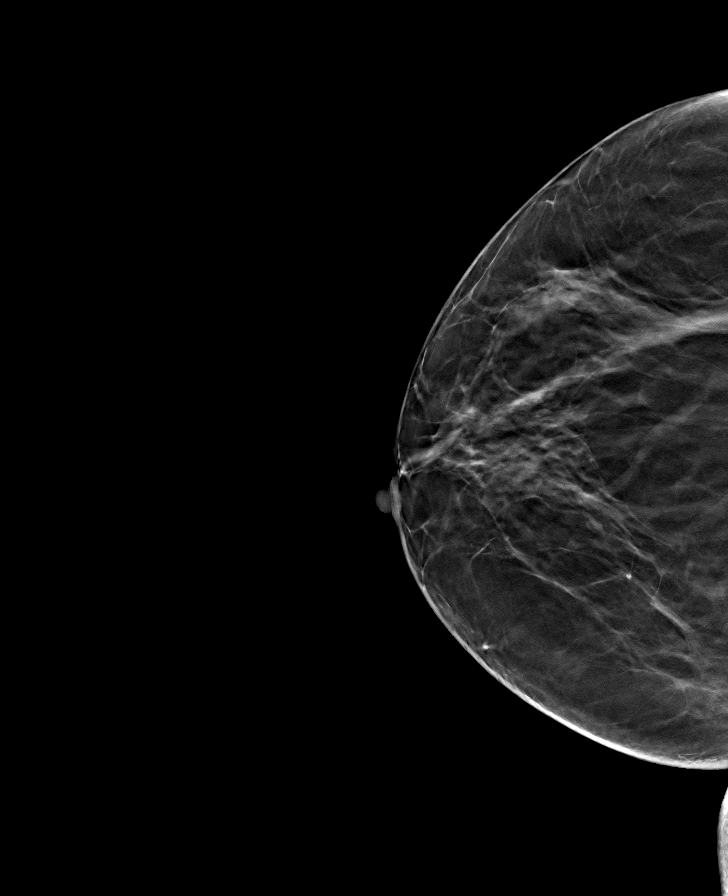

[L MLO tomo · tomo slice 33/64.0]
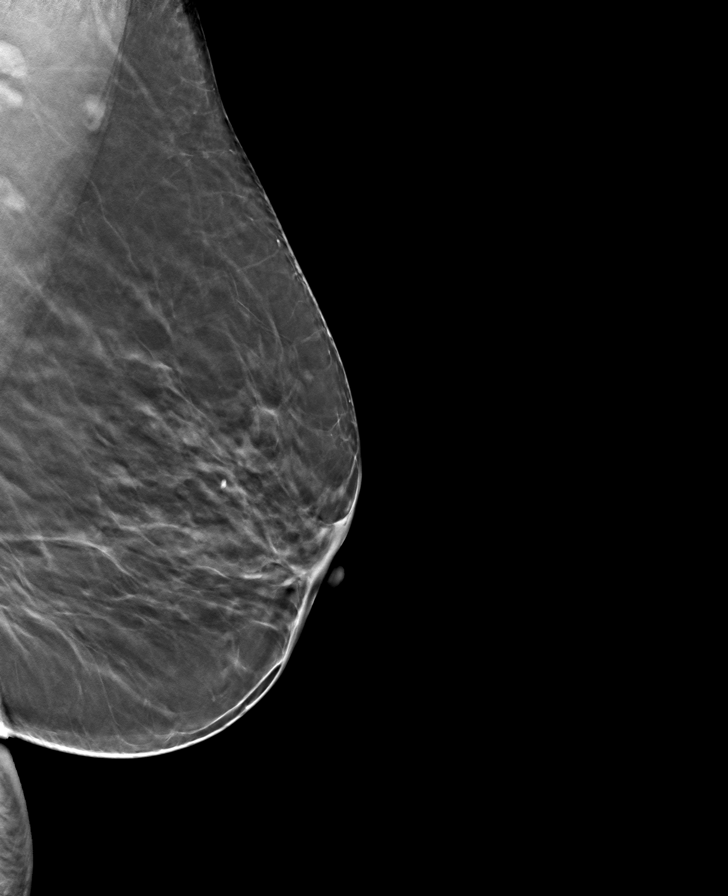

[R MLO tomo · tomo slice 33/65.0]
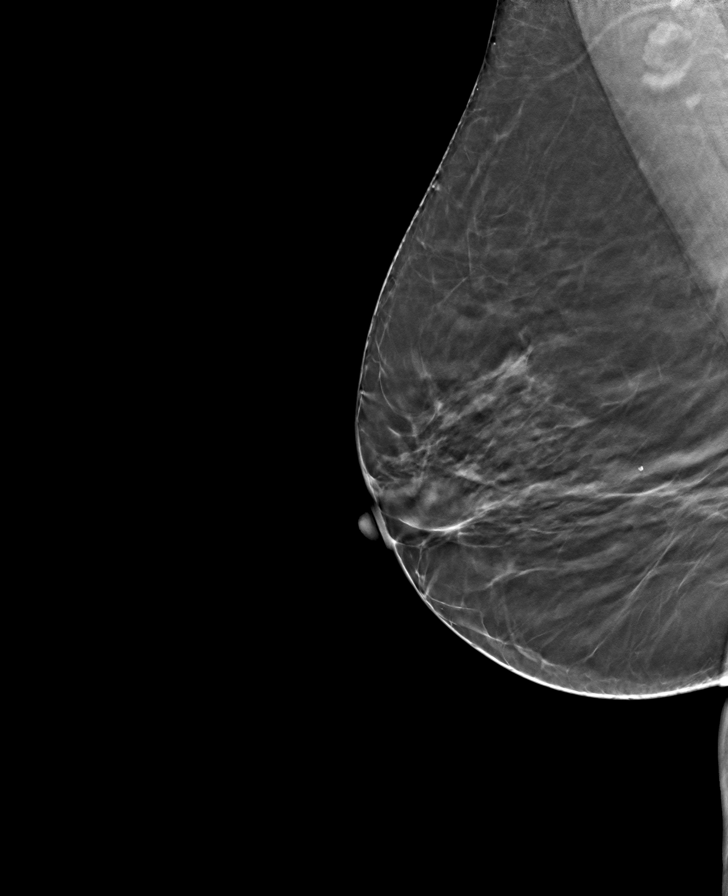

[8 of 24 positions shown; findings below may reference images not displayed]

ACR Breast Density Category b: There are scattered areas of
fibroglandular density.
FINDINGS: There are no findings suspicious for malignancy. Images were
processed with CAD.
IMPRESSION: No mammographic evidence of malignancy. A result letter of this
screening mammogram will be mailed directly to the patient.

RECOMMENDATION:
Screening mammogram in one year. (Code:[id number])

BI-RADS CATEGORY  1: Negative.

## 2022-05-30 ENCOUNTER — Ambulatory Visit
Admission: EM | Admit: 2022-05-30 | Discharge: 2022-05-30 | Disposition: A | Discharge status: Home / Self Care | Payer: 59 | Attending: Internal Medicine | Admitting: Internal Medicine

## 2022-05-30 DIAGNOSIS — B353 Tinea pedis: Secondary | ICD-10-CM

## 2022-05-30 MED ORDER — BUTENAFINE HCL 1 % EX CREA: TOPICAL_CREAM | CUTANEOUS | 0 refills | Status: DC

## 2022-05-30 NOTE — ED Triage Notes (Signed)
Patient presents to bilateral feet swelling x 3 days. Pt states she has been walking more freq and has had increased salt intake inher diet. Patient also concerned with itching on her feet. Hx of HTN.

## 2022-05-30 NOTE — ED Provider Notes (Signed)
[facility]    CSN: [id number] Arrival date & time: 05/30/22  6063      History   Chief Complaint Chief Complaint  Patient presents with   Foot Pain    HPI Ariana Jacobs is a 56 y.o. female who presents with bilateral feet swelling x 3 days. Admits has been walking more and increase salt in her diet. Today they dont look swollen.Has hx of HTN. Had normal echo 3 years ago.   2- since she has gone to get a pedicure 2 weeks ago, she has been having itching of both her feet. She has been using a soap for fungus and bacteria and seems to be helping, but not resolved.    Past Medical History:  Diagnosis Date   Diabetes mellitus without complication ([place])    Hypertension     There are no problems to display for this patient.   Past Surgical History:  Procedure Laterality Date   CESAREAN SECTION     FOOT SURGERY      OB History   No obstetric history on file.      Home Medications    Prior to Admission medications   Medication Sig Start Date End Date Taking? Authorizing Provider  Butenafine HCl (LOTRIMIN ULTRA) 1 % cream Apply on both feet bid x 2-4 weeks 05/30/22  Yes Rodriguez-Southworth, Sunday Spillers, PA-C  amLODipine (NORVASC) 5 MG tablet  01/22/19   [provider]  atorvastatin (LIPITOR) 40 MG tablet Take 40 mg by mouth daily as needed. 08/16/21   [provider]  ipratropium (ATROVENT) 0.06 % nasal spray Place 2 sprays into both nostrils 4 (four) times daily. 10/11/21   Margarette Canada, NP  LEVEMIR FLEXTOUCH 100 UNIT/ML Pen  02/12/19   [provider]  lisinopril-hydrochlorothiazide (ZESTORETIC) 20-25 MG tablet  01/08/19   [provider]  metFORMIN (GLUCOPHAGE) 1000 MG tablet 2 (two) times daily with a meal.  02/04/19   [provider]  GLIPIZIDE PO Take by mouth daily.  09/20/19  [provider]  HYDRALAZINE-HCTZ PO Take by mouth daily.  04/07/20  [provider]  hydrochlorothiazide (HYDRODIURIL) 25 MG  tablet  01/01/19 09/20/19  [provider]  LISINOPRIL PO Take by mouth daily.  09/20/19  [provider]    Family History Family History  Problem Relation Age of Onset   Healthy Mother    Diabetes Father    Breast cancer Neg Hx     Social History Social History   Tobacco Use   Smoking status: Never   Smokeless tobacco: Never  Vaping Use   Vaping Use: Never used  Substance Use Topics   Alcohol use: Yes    Comment: Occasionally   Drug use: No     Allergies   Patient has no known allergies.   Review of Systems Review of Systems  Skin:  Positive for rash. Negative for color change, pallor and wound.  Neurological:  Negative for numbness.     Physical Exam Triage Vital Signs ED Triage Vitals  Enc Vitals Group     BP 05/30/22 0942 131/78     Pulse Rate 05/30/22 0942 95     Resp 05/30/22 0942 16     Temp 05/30/22 0942 98.7 F (37.1 C)     Temp Source 05/30/22 0942 Oral     SpO2 05/30/22 0942 100 %     Weight --      Height --      Head Circumference --  Peak Flow --      Pain Score 05/30/22 0941 0     Pain Loc --      Pain Edu? --      Excl. in [place]? --    No data found.  Updated Vital Signs BP 131/78 (BP Location: Left Arm)   Pulse 95   Temp 98.7 F (37.1 C) (Oral)   Resp 16   SpO2 100%   Visual Acuity Right Eye Distance:   Left Eye Distance:   Bilateral Distance:    Right Eye Near:   Left Eye Near:    Bilateral Near:     Physical Exam Vitals and nursing note reviewed.  Constitutional:      General: She is not in acute distress.    Appearance: She is not toxic-appearing.  Eyes:     General: No scleral icterus.    Conjunctiva/sclera: Conjunctivae normal.  Cardiovascular:     Pulses: Normal pulses.  Pulmonary:     Effort: Pulmonary effort is normal.  Musculoskeletal:        General: Normal range of motion.     Cervical back: Neck supple.     Right lower leg: No edema.     Left lower leg: No edema.  Skin:     General: Skin is warm and dry.     Findings: Rash present.     Comments: L medial foot with peeling skin as well of mild of both heels and R medial foot. No wounds or maceration noted between toes.   Neurological:     Mental Status: She is alert and oriented to person, place, and time.     Gait: Gait normal.  Psychiatric:        Mood and Affect: Mood normal.        Behavior: Behavior normal.        Thought Content: Thought content normal.        Judgment: Judgment normal.      UC Treatments / Results  Labs (all labs ordered are listed, but only abnormal results are displayed) Labs Reviewed - No data to display  EKG   Radiology No results found.  Procedures Procedures (including critical care time)  Medications Ordered in UC Medications - No data to display  Initial Impression / Assessment and Plan / UC Course  I have reviewed the triage vital signs and the nursing notes. Edema resolve Tinea pedis  I placed her on Lotrimin Ultra cream as noted.  Advised to FU with podiatrist for nail care since she is a diabetic   Final Clinical Impressions(s) / UC Diagnoses  Tinea pedis both feet Edema has resolved  Final diagnoses:  Tinea pedis of both feet   Discharge Instructions   None    ED Prescriptions     Medication Sig Dispense Auth. Provider   Butenafine HCl (LOTRIMIN ULTRA) 1 % cream Apply on both feet bid x 2-4 weeks 30 g Rodriguez-Southworth, Nettie Elm, PA-C      PDMP not reviewed this encounter.   Garey Ham, PA-C 05/30/22 1019

## 2022-07-14 ENCOUNTER — Ambulatory Visit
Admission: EM | Admit: 2022-07-14 | Discharge: 2022-07-14 | Disposition: A | Discharge status: Home / Self Care | Payer: 59

## 2022-07-14 DIAGNOSIS — M79601 Pain in right arm: Secondary | ICD-10-CM | POA: Diagnosis not present

## 2022-07-14 DIAGNOSIS — M549 Dorsalgia, unspecified: Secondary | ICD-10-CM | POA: Diagnosis not present

## 2022-07-14 DIAGNOSIS — M79604 Pain in right leg: Secondary | ICD-10-CM | POA: Diagnosis not present

## 2022-07-14 MED ORDER — TIZANIDINE HCL 4 MG PO CAPS: 4.0000 mg | ORAL_CAPSULE | Freq: Every evening | ORAL | 0 refills | Status: DC | PRN

## 2022-07-14 MED ORDER — IBUPROFEN 600 MG PO TABS: 600.0000 mg | ORAL_TABLET | Freq: Three times a day (TID) | ORAL | 0 refills | Status: DC | PRN

## 2022-07-14 NOTE — ED Triage Notes (Signed)
Patient reports that her right arm and right leg pain -- started about 3 days ago.   Patient also reports that she started jardiance Tuesday and it is making her nauseous.

## 2022-07-14 NOTE — ED Provider Notes (Signed)
[facility]    CSN: [id number] Arrival date & time: 07/14/22  5397      History   Chief Complaint Chief Complaint  Patient presents with   Arm Pain    Right    Leg Pain    Right     HPI Ariana Jacobs is a 56 y.o. female.   Patient presents today with a several day history of right-sided pain.  She reports that she has some right shoulder pain with radiation throughout her arm, right neck pain, right back pain that radiates into her upper leg.  She denies any known injury or increase in activity prior to symptom onset.  She has not tried any over-the-counter medication for symptom management other than occasional dose of Tylenol which has been ineffective.  She denies any change in her physical activity, recent fall, recent injury.  She denies previous injury or surgery involving her back.  Denies any numbness or paresthesias in her extremities.  She is right-handed and denies any associated weakness.  She does have a physically demanding job but actively avoids muscle injury/overuse.  She is having difficulty with daily cavities as result of symptoms.  She denies history of rheumatoid arthritis, gout, autoimmune condition.  Pain is rated 10 on a 0-10 pain scale, described as aching, no aggravating or relieving factors identified.  In addition, patient reports that she has had some intermittent nausea since starting Jardiance for diabetes.  Denies any vomiting, abdominal pain, diarrhea, fever.  Denies any additional medication changes or recent antibiotic use.    Past Medical History:  Diagnosis Date   Diabetes mellitus without complication (HCC)    Hypertension     There are no problems to display for this patient.   Past Surgical History:  Procedure Laterality Date   CESAREAN SECTION     FOOT SURGERY      OB History   No obstetric history on file.      Home Medications    Prior to Admission medications   Medication Sig Start Date End Date Taking?  Authorizing Provider  ibuprofen (ADVIL) 600 MG tablet Take 1 tablet (600 mg total) by mouth every 8 (eight) hours as needed. 07/14/22  Yes Erum Cercone K, PA-C  JARDIANCE 10 MG TABS tablet Take 10 mg by mouth daily. 07/12/22  Yes [provider]  tiZANidine (ZANAFLEX) 4 MG capsule Take 1 capsule (4 mg total) by mouth at bedtime as needed for muscle spasms. 07/14/22  Yes Sami Froh, Noberto Retort, PA-C  amLODipine (NORVASC) 5 MG tablet  01/22/19   [provider]  atorvastatin (LIPITOR) 40 MG tablet Take 40 mg by mouth daily as needed. 08/16/21   [provider]  Butenafine HCl (LOTRIMIN ULTRA) 1 % cream Apply on both feet bid x 2-4 weeks 05/30/22   Rodriguez-Southworth, Nettie Elm, PA-C  ipratropium (ATROVENT) 0.06 % nasal spray Place 2 sprays into both nostrils 4 (four) times daily. 10/11/21   Becky Augusta, NP  LEVEMIR FLEXTOUCH 100 UNIT/ML Pen  02/12/19   [provider]  lisinopril-hydrochlorothiazide (ZESTORETIC) 20-25 MG tablet  01/08/19   [provider]  metFORMIN (GLUCOPHAGE) 1000 MG tablet 2 (two) times daily with a meal.  02/04/19   [provider]  GLIPIZIDE PO Take by mouth daily.  09/20/19  [provider]  HYDRALAZINE-HCTZ PO Take by mouth daily.  04/07/20  [provider]  hydrochlorothiazide (HYDRODIURIL) 25 MG tablet  01/01/19 09/20/19  [provider]  LISINOPRIL PO Take by mouth  daily.  09/20/19  [provider]    Family History Family History  Problem Relation Age of Onset   Healthy Mother    Diabetes Father    Breast cancer Neg Hx     Social History Social History   Tobacco Use   Smoking status: Never   Smokeless tobacco: Never  Vaping Use   Vaping Use: Never used  Substance Use Topics   Alcohol use: Yes    Comment: Occasionally   Drug use: No     Allergies   Patient has no known allergies.   Review of Systems Review of Systems  Constitutional:  Positive for activity change. Negative for  appetite change, fatigue and fever.  Gastrointestinal:  Positive for nausea. Negative for abdominal pain, diarrhea and vomiting.  Musculoskeletal:  Positive for arthralgias, back pain, myalgias and neck pain.  Neurological:  Negative for dizziness, weakness, light-headedness, numbness and headaches.     Physical Exam Triage Vital Signs ED Triage Vitals  Enc Vitals Group     BP 07/14/22 1003 136/87     Pulse Rate 07/14/22 1003 89     Resp --      Temp 07/14/22 1003 98.1 F (36.7 C)     Temp Source 07/14/22 1003 Oral     SpO2 07/14/22 1003 100 %     Weight 07/14/22 1002 139 lb 15.9 oz (63.5 kg)     Height 07/14/22 1002 4\' 11"  (1.499 m)     Head Circumference --      Peak Flow --      Pain Score 07/14/22 1002 10     Pain Loc --      Pain Edu? --      Excl. in GC? --    No data found.  Updated Vital Signs BP 136/87 (BP Location: Left Arm)   Pulse 89   Temp 98.1 F (36.7 C) (Oral)   Ht 4\' 11"  (1.499 m)   Wt 139 lb 15.9 oz (63.5 kg)   SpO2 100%   BMI 28.27 kg/m   Visual Acuity Right Eye Distance:   Left Eye Distance:   Bilateral Distance:    Right Eye Near:   Left Eye Near:    Bilateral Near:     Physical Exam Vitals reviewed.  Constitutional:      General: She is awake. She is not in acute distress.    Appearance: Normal appearance. She is well-developed. She is not ill-appearing.     Comments: Very pleasant female appears stated age in no acute distress sitting comfortably in exam room  HENT:     Head: Normocephalic and atraumatic.  Cardiovascular:     Rate and Rhythm: Normal rate and regular rhythm.     Heart sounds: Normal heart sounds, S1 normal and S2 normal. No murmur heard. Pulmonary:     Effort: Pulmonary effort is normal.     Breath sounds: Normal breath sounds. No wheezing, rhonchi or rales.     Comments: Clear to auscultation bilaterally Abdominal:     General: Bowel sounds are normal.     Palpations: Abdomen is soft.     Tenderness: There is  no abdominal tenderness. There is no right CVA tenderness, left CVA tenderness, guarding or rebound.     Comments: Benign abdominal exam  Musculoskeletal:     Right shoulder: No swelling, tenderness or bony tenderness. Normal range of motion.     Right upper arm: Tenderness present. No bony tenderness.     Cervical  back: Tenderness present. No spasms or bony tenderness.     Thoracic back: Tenderness present. No bony tenderness.     Lumbar back: Tenderness present. No bony tenderness. Negative right straight leg raise test and negative left straight leg raise test.     Right upper leg: Tenderness present. No bony tenderness.     Comments: Back: Tenderness palpation over right cervical/thoracic/lumbar paraspinal muscles.  No significant spasm noted on exam.  Strength 5/5 bilateral upper and lower extremities.  No deformity or step-off noted.  No pain percussion of vertebrae.  Negative straight leg raise.  Right arm: Normal active range of motion in all major joints.  Tenderness palpation over upper and forearm musculature.  Strength 5/5.  Hand neurovascularly intact.  Normal pincer grip strength.  Right leg: Tenderness palpation over lateral upper leg.  Strength 5/5.  Normal active range of motion at major joints.    Psychiatric:        Behavior: Behavior is cooperative.      UC Treatments / Results  Labs (all labs ordered are listed, but only abnormal results are displayed) Labs Reviewed - No data to display  EKG   Radiology No results found.  Procedures Procedures (including critical care time)  Medications Ordered in UC Medications - No data to display  Initial Impression / Assessment and Plan / UC Course  I have reviewed the triage vital signs and the nursing notes.  Pertinent labs & imaging results that were available during my care of the patient were reviewed by me and considered in my medical decision making (see chart for details).     Plain films were deferred as  patient has no bony tenderness and denies any recent trauma.  Low suspicion for systemic etiology given unilateral presentation so basic lab work was deferred we will consider this if symptoms are not improving quickly.  I suspect since this is her dominant side she might have overworked this area with the physical demands of her work.  Recommended that she limit strenuous activity.  She was started on ibuprofen 600 mg up to 3 times a day with instruction not to take NSAIDs with this medication.  She was also given Zanaflex to be taken at night; we discussed that this is sedating and she should not drive or drink alcohol with taking this medication.  She was encouraged to push fluids to ensure dehydration is not contributing to symptoms.  Discussed that if her symptoms are not improving or if anything worsens she needs to be seen immediately.  Strict return precautions given.  Work excuse note provided.  Vital signs physical exam reassuring today with no indication for emergent evaluation or imaging.  Suspect nausea is a side effect of Jardiance.  Recommended she try increasing the amount of food that she is eating when taking her medication.  Recommend she follow-up with prescriber to determine if dose adjustment is reasonable.  If she develops any worsening symptoms she needs to be seen immediately.  Final Clinical Impressions(s) / UC Diagnoses   Final diagnoses:  Acute right-sided back pain, unspecified back location  Right arm pain  Right leg pain     Discharge Instructions      Avoid strenuous activity.  Use heat, gentle stretch for symptom relief.  Take ibuprofen up to 3 times a day.  Do not take NSAIDs with this medication including aspirin, ibuprofen/Advil, naproxen/Aleve.  You can use acetaminophen/Tylenol.  Take Zanaflex at night.  This will make you sleepy so do  not drive or drink alcohol with taking it.  If your symptoms are improving please return for reevaluation.  If anything worsens  you need to be seen immediately.  I believe the nausea is related to your Jardiance.  Make sure you are eating a meal when you take this medication.  If you continue to have nausea after being on the medication please follow-up with your prescriber to consider dose adjustment.  If you develop any worsening symptoms you need to be seen immediately including vomiting, abdominal pain, fever, weakness.     ED Prescriptions     Medication Sig Dispense Auth. Provider   tiZANidine (ZANAFLEX) 4 MG capsule Take 1 capsule (4 mg total) by mouth at bedtime as needed for muscle spasms. 7 capsule Winry Egnew K, PA-C   ibuprofen (ADVIL) 600 MG tablet Take 1 tablet (600 mg total) by mouth every 8 (eight) hours as needed. 30 tablet Chanti Golubski, Noberto Retort, PA-C      PDMP not reviewed this encounter.   Jeani Hawking, PA-C 07/14/22 1029

## 2022-07-14 NOTE — Discharge Instructions (Signed)
Avoid strenuous activity.  Use heat, gentle stretch for symptom relief.  Take ibuprofen up to 3 times a day.  Do not take NSAIDs with this medication including aspirin, ibuprofen/Advil, naproxen/Aleve.  You can use acetaminophen/Tylenol.  Take Zanaflex at night.  This will make you sleepy so do not drive or drink alcohol with taking it.  If your symptoms are improving please return for reevaluation.  If anything worsens you need to be seen immediately.  I believe the nausea is related to your Jardiance.  Make sure you are eating a meal when you take this medication.  If you continue to have nausea after being on the medication please follow-up with your prescriber to consider dose adjustment.  If you develop any worsening symptoms you need to be seen immediately including vomiting, abdominal pain, fever, weakness.

## 2022-07-15 ENCOUNTER — Other Ambulatory Visit: Payer: Self-pay | Admitting: Nurse Practitioner

## 2022-07-15 DIAGNOSIS — Z1231 Encounter for screening mammogram for malignant neoplasm of breast: Secondary | ICD-10-CM

## 2022-08-27 IMAGING — CR DG ABDOMEN 1V
2 series · 2 of 2 positions shown · non-contrast
Comparison: 10/30/2018

CLINICAL DATA: Left-sided abdominal pain

EXAM:
ABDOMEN - 1 VIEW

[abdomen kub (1 of 2)]
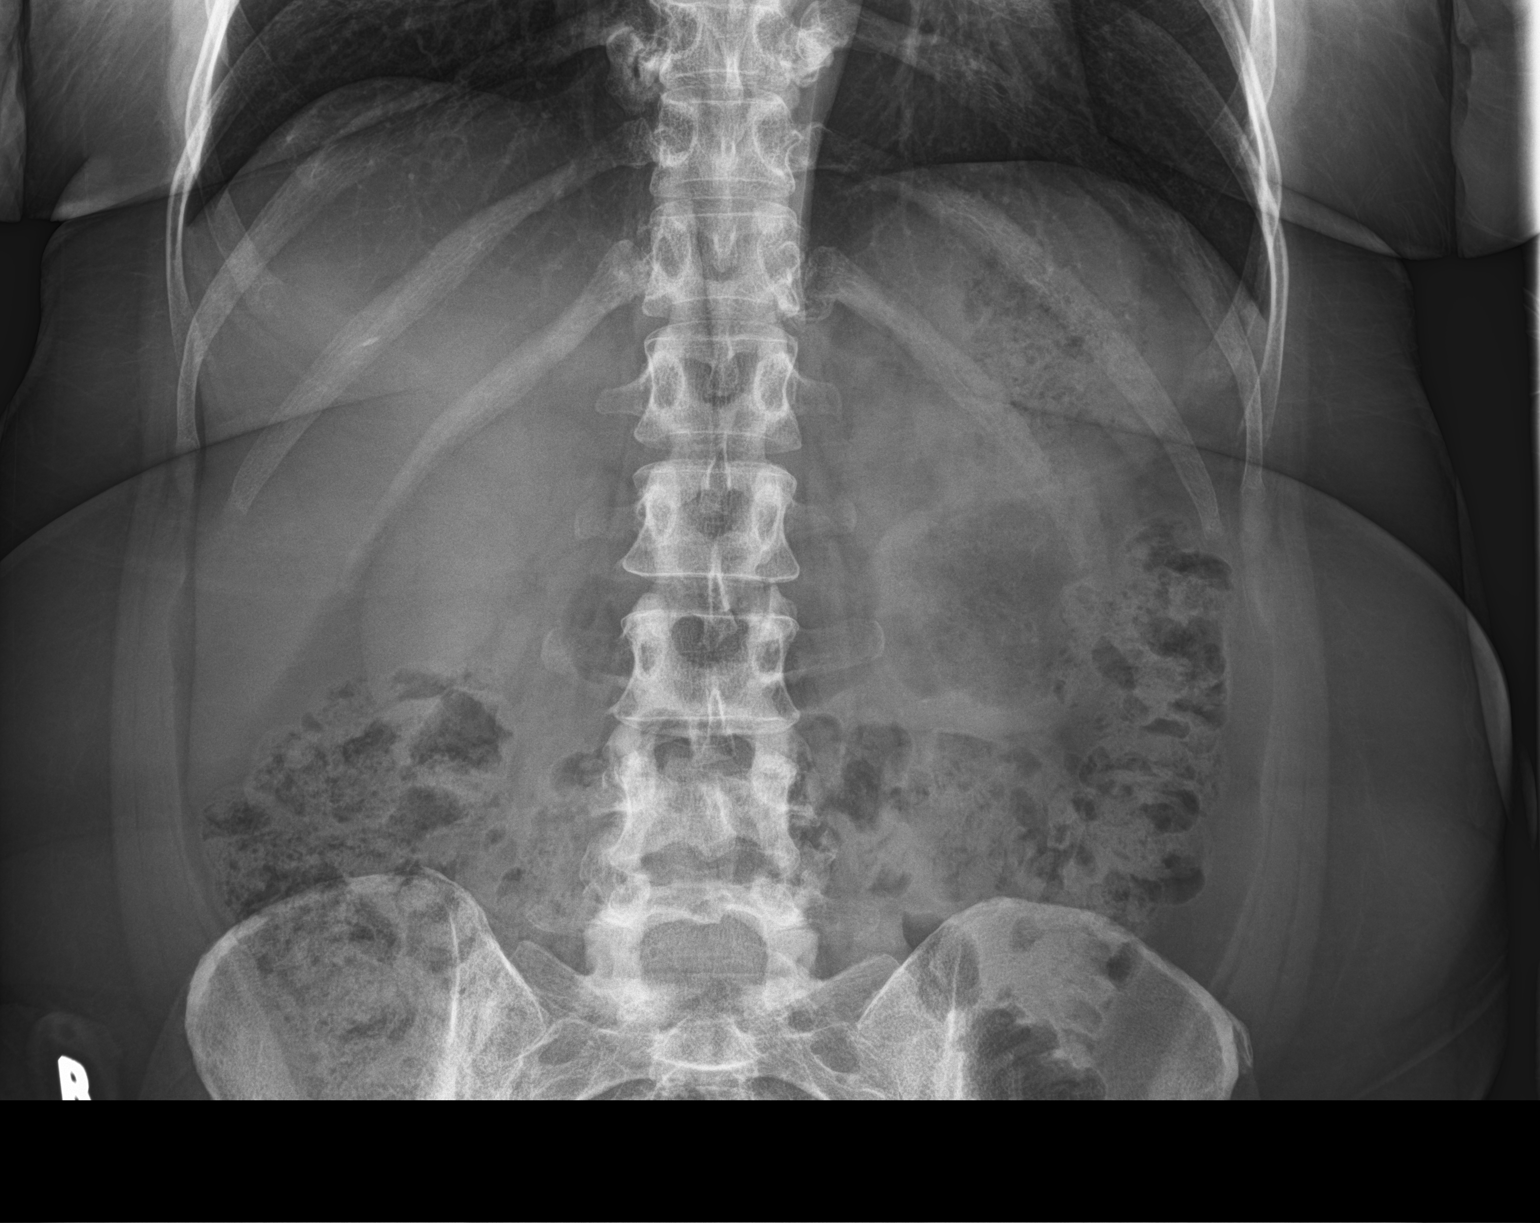

[abdomen kub (2 of 2)]
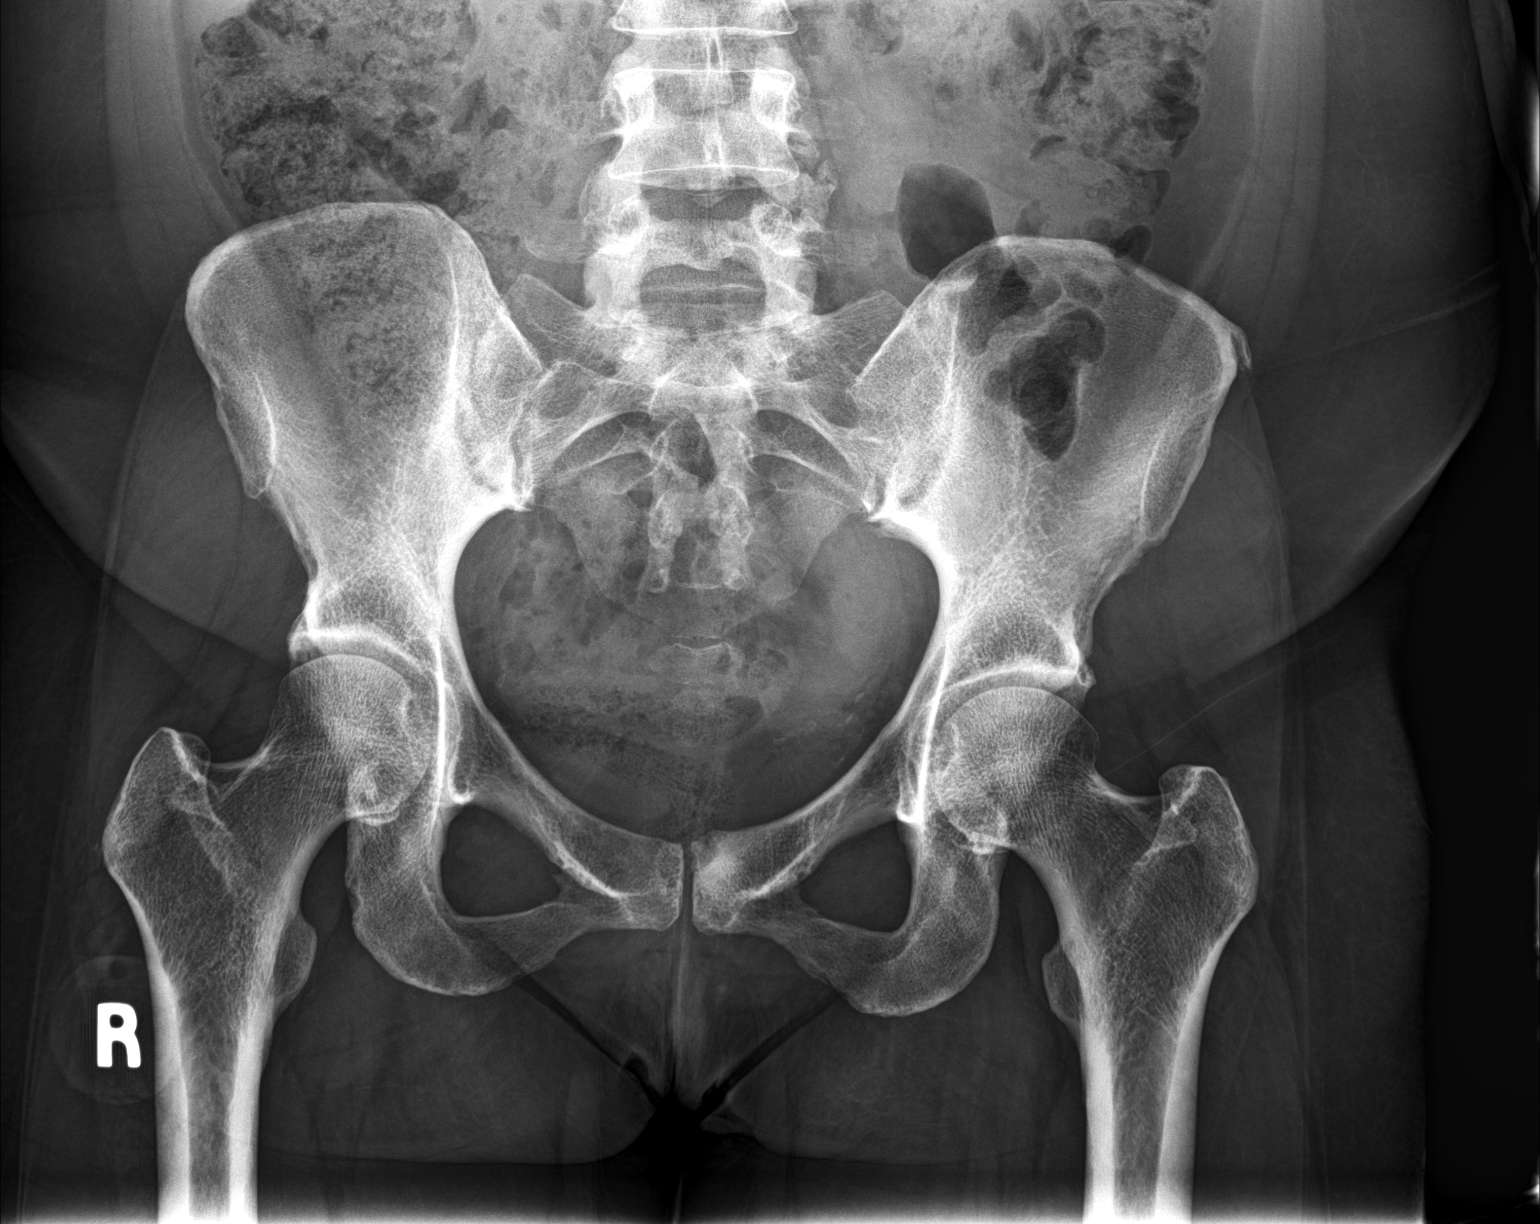

[2 of 2 positions shown; findings below may reference images not displayed]

FINDINGS: Scattered large and small bowel gas is noted. Fecal material is
noted throughout the colon consistent with a degree of constipation.
No obstructive changes are seen. No calcifications are noted. No
bony abnormality is seen.
IMPRESSION: Mild constipation.

## 2022-09-12 ENCOUNTER — Encounter: Payer: Self-pay | Admitting: Emergency Medicine

## 2022-09-12 ENCOUNTER — Ambulatory Visit
Admission: EM | Admit: 2022-09-12 | Discharge: 2022-09-12 | Disposition: A | Discharge status: Home / Self Care | Payer: 59 | Attending: Family Medicine | Admitting: Family Medicine

## 2022-09-12 DIAGNOSIS — E119 Type 2 diabetes mellitus without complications: Secondary | ICD-10-CM | POA: Diagnosis not present

## 2022-09-12 DIAGNOSIS — Z7984 Long term (current) use of oral hypoglycemic drugs: Secondary | ICD-10-CM | POA: Diagnosis not present

## 2022-09-12 DIAGNOSIS — R058 Other specified cough: Secondary | ICD-10-CM | POA: Diagnosis not present

## 2022-09-12 DIAGNOSIS — J101 Influenza due to other identified influenza virus with other respiratory manifestations: Secondary | ICD-10-CM | POA: Diagnosis present

## 2022-09-12 DIAGNOSIS — Z79899 Other long term (current) drug therapy: Secondary | ICD-10-CM | POA: Insufficient documentation

## 2022-09-12 DIAGNOSIS — Z1152 Encounter for screening for COVID-19: Secondary | ICD-10-CM | POA: Diagnosis not present

## 2022-09-12 DIAGNOSIS — I10 Essential (primary) hypertension: Secondary | ICD-10-CM | POA: Diagnosis not present

## 2022-09-12 LAB — RESP PANEL BY RT-PCR (RSV, FLU A&B, COVID)  RVPGX2
Influenza A by PCR: POSITIVE — AB
Influenza B by PCR: NEGATIVE
Resp Syncytial Virus by PCR: NEGATIVE
SARS Coronavirus 2 by RT PCR: NEGATIVE

## 2022-09-12 MED ORDER — PROMETHAZINE-DM 6.25-15 MG/5ML PO SYRP: 5.0000 mL | ORAL_SOLUTION | Freq: Four times a day (QID) | ORAL | 0 refills | Status: DC | PRN

## 2022-09-12 NOTE — Discharge Instructions (Addendum)
You have influenza A.  Stop by the pharmacy to pick up your prescriptions.   You can take Tylenol and/or Ibuprofen as needed for fever reduction and pain relief.    For cough: honey 1/2 to 1 teaspoon (you can dilute the honey in water or another fluid).  You can also use guaifenesin and dextromethorphan for cough. You can use a humidifier for chest congestion and cough.  If you don't have a humidifier, you can sit in the bathroom with the hot shower running.      For sore throat: try warm salt water gargles, Mucinex sore throat cough drops or cepacol lozenges, throat spray, warm tea or water with lemon/honey, popsicles or ice, or OTC cold relief medicine for throat discomfort. You can also purchase chloraseptic spray at the pharmacy or dollar store.   For congestion: take a daily anti-histamine like Zyrtec, Claritin, and a oral decongestant, such as pseudoephedrine.  You can also use Flonase 1-2 sprays in each nostril daily. Afrin is also a good option, if you do not have high blood pressure.    It is important to stay hydrated: drink plenty of fluids (water, gatorade/powerade/pedialyte, juices, or teas) to keep your throat moisturized and help further relieve irritation/discomfort.    Return or go to the Emergency Department if symptoms worsen or do not improve in the next few days

## 2022-09-12 NOTE — ED Provider Notes (Signed)
[facility]    CSN: [id number] Arrival date & time: 09/12/22  1232      History   Chief Complaint Chief Complaint  Patient presents with   Cough   Sore Throat   Nasal Congestion    HPI Ariana Jacobs is a 57 y.o. female.   HPI   Ariana Jacobs presents for fatigue, cough, sore throat, nasal congestion, body aches and decreased appetite that started on Thursday. Her female friend had similar sx. Feels chest congestion. Took some OTC meds without relief. Drank some honey with tea. Denies fever, shortness of breath, vomiting, diarrhea, belly pain and chest pain.        Past Medical History:  Diagnosis Date   Diabetes mellitus without complication (HCC)    Hypertension     There are no problems to display for this patient.   Past Surgical History:  Procedure Laterality Date   CESAREAN SECTION     FOOT SURGERY      OB History   No obstetric history on file.      Home Medications    Prior to Admission medications   Medication Sig Start Date End Date Taking? Authorizing Provider  promethazine-dextromethorphan (PROMETHAZINE-DM) 6.25-15 MG/5ML syrup Take 5 mLs by mouth 4 (four) times daily as needed. 09/12/22  Yes Donalyn Schneeberger, Seward Meth, DO  amLODipine (NORVASC) 5 MG tablet  01/22/19   [provider]  atorvastatin (LIPITOR) 40 MG tablet Take 40 mg by mouth daily as needed. 08/16/21   [provider]  Butenafine HCl (LOTRIMIN ULTRA) 1 % cream Apply on both feet bid x 2-4 weeks 05/30/22   Rodriguez-Southworth, Nettie Elm, PA-C  ibuprofen (ADVIL) 600 MG tablet Take 1 tablet (600 mg total) by mouth every 8 (eight) hours as needed. 07/14/22   Raspet, Erin K, PA-C  ipratropium (ATROVENT) 0.06 % nasal spray Place 2 sprays into both nostrils 4 (four) times daily. 10/11/21   Becky Augusta, NP  JARDIANCE 10 MG TABS tablet Take 10 mg by mouth daily. 07/12/22   [provider]  LEVEMIR FLEXTOUCH 100 UNIT/ML Pen  02/12/19   [provider]   lisinopril-hydrochlorothiazide (ZESTORETIC) 20-25 MG tablet  01/08/19   [provider]  metFORMIN (GLUCOPHAGE) 1000 MG tablet 2 (two) times daily with a meal.  02/04/19   [provider]  tiZANidine (ZANAFLEX) 4 MG capsule Take 1 capsule (4 mg total) by mouth at bedtime as needed for muscle spasms. 07/14/22   Raspet, Erin K, PA-C  GLIPIZIDE PO Take by mouth daily.  09/20/19  [provider]  HYDRALAZINE-HCTZ PO Take by mouth daily.  04/07/20  [provider]  hydrochlorothiazide (HYDRODIURIL) 25 MG tablet  01/01/19 09/20/19  [provider]  LISINOPRIL PO Take by mouth daily.  09/20/19  [provider]    Family History Family History  Problem Relation Age of Onset   Healthy Mother    Diabetes Father    Breast cancer Neg Hx     Social History Social History   Tobacco Use   Smoking status: Never   Smokeless tobacco: Never  Vaping Use   Vaping Use: Never used  Substance Use Topics   Alcohol use: Yes    Comment: Occasionally   Drug use: No     Allergies   Patient has no known allergies.   Review of Systems Review of Systems: negative unless otherwise stated in HPI.      Physical Exam Triage Vital Signs ED Triage Vitals  Enc Vitals Group  BP 09/12/22 1516 112/77     Pulse Rate 09/12/22 1516 94     Resp 09/12/22 1516 16     Temp 09/12/22 1516 98.2 F (36.8 C)     Temp Source 09/12/22 1516 Oral     SpO2 09/12/22 1516 100 %     Weight --      Height --      Head Circumference --      Peak Flow --      Pain Score 09/12/22 1515 0     Pain Loc --      Pain Edu? --      Excl. in GC? --    No data found.  Updated Vital Signs BP 112/77 (BP Location: Left Arm)   Pulse 94   Temp 98.2 F (36.8 C) (Oral)   Resp 16   SpO2 100%   Visual Acuity Right Eye Distance:   Left Eye Distance:   Bilateral Distance:    Right Eye Near:   Left Eye Near:    Bilateral Near:     Physical Exam GEN:     alert, non-toxic  appearing female in no distress    HENT:  mucus membranes moist, oropharyngeal without erythema, lesions or exudate, no tonsillar hypertrophy, clear nasal discharge, bilateral TM normal EYES:   pupils equal and reactive, no scleral injection or discharge NECK:  normal ROM, no meningismus   RESP:  no increased work of breathing, clear to auscultation bilaterally CVS:   regular rate and rhythm Skin:   warm and dry    UC Treatments / Results  Labs (all labs ordered are listed, but only abnormal results are displayed) Labs Reviewed  RESP PANEL BY RT-PCR (RSV, FLU A&B, COVID)  RVPGX2 - Abnormal; Notable for the following components:      Result Value   Influenza A by PCR POSITIVE (*)    All other components within normal limits    EKG   Radiology No results found.  Procedures Procedures (including critical care time)  Medications Ordered in UC Medications - No data to display  Initial Impression / Assessment and Plan / UC Course  I have reviewed the triage vital signs and the nursing notes.  Pertinent labs & imaging results that were available during my care of the patient were reviewed by me and considered in my medical decision making (see chart for details).       Pt is a 57 y.o. female who presents for 3 days of respiratory symptoms. Ariana Jacobs is afebrile here without recent antipyretics. Satting well on room air. Overall pt is non-toxic appearing, well hydrated, without respiratory distress. Pulmonary exam is unremarkable.  COVID and influenza testing obtained. History consistent with viral respiratory illness. Pt's influenza A test is positive. Unfortunately, she is outside the window for antiviral therapy. Discussed symptomatic treatment. Promethazine DM for cough. Typical duration of symptoms discussed.   Return and ED precautions given and voiced understanding. Discussed MDM, treatment plan and plan for follow-up with patient who agrees with plan.     Final Clinical  Impressions(s) / UC Diagnoses   Final diagnoses:  Influenza A     Discharge Instructions      You have influenza A.  Stop by the pharmacy to pick up your prescriptions.   You can take Tylenol and/or Ibuprofen as needed for fever reduction and pain relief.    For cough: honey 1/2 to 1 teaspoon (you can dilute the honey in water or another fluid).  You can also use guaifenesin and dextromethorphan for cough. You can use a humidifier for chest congestion and cough.  If you don't have a humidifier, you can sit in the bathroom with the hot shower running.      For sore throat: try warm salt water gargles, Mucinex sore throat cough drops or cepacol lozenges, throat spray, warm tea or water with lemon/honey, popsicles or ice, or OTC cold relief medicine for throat discomfort. You can also purchase chloraseptic spray at the pharmacy or dollar store.   For congestion: take a daily anti-histamine like Zyrtec, Claritin, and a oral decongestant, such as pseudoephedrine.  You can also use Flonase 1-2 sprays in each nostril daily. Afrin is also a good option, if you do not have high blood pressure.    It is important to stay hydrated: drink plenty of fluids (water, gatorade/powerade/pedialyte, juices, or teas) to keep your throat moisturized and help further relieve irritation/discomfort.    Return or go to the Emergency Department if symptoms worsen or do not improve in the next few days      ED Prescriptions     Medication Sig Dispense Auth. Provider   promethazine-dextromethorphan (PROMETHAZINE-DM) 6.25-15 MG/5ML syrup Take 5 mLs by mouth 4 (four) times daily as needed. 118 mL Lyndee Hensen, DO      PDMP not reviewed this encounter.   Lyndee Hensen, DO 09/12/22 1612

## 2022-09-12 NOTE — ED Triage Notes (Signed)
Pt c/o cough, sore throat, loss of appetite, nasal congestion and fatigue x 4 days.

## 2022-11-27 ENCOUNTER — Encounter: Payer: Self-pay | Admitting: Emergency Medicine

## 2022-11-27 ENCOUNTER — Ambulatory Visit
Admission: EM | Admit: 2022-11-27 | Discharge: 2022-11-27 | Disposition: A | Discharge status: Home / Self Care | Payer: 59 | Attending: Emergency Medicine | Admitting: Emergency Medicine

## 2022-11-27 DIAGNOSIS — S76111A Strain of right quadriceps muscle, fascia and tendon, initial encounter: Secondary | ICD-10-CM

## 2022-11-27 MED ORDER — IBUPROFEN 600 MG PO TABS: 600.0000 mg | ORAL_TABLET | Freq: Four times a day (QID) | ORAL | 0 refills | Status: DC | PRN

## 2022-11-27 MED ORDER — BACLOFEN 10 MG PO TABS: 10.0000 mg | ORAL_TABLET | Freq: Three times a day (TID) | ORAL | 0 refills | Status: DC

## 2022-11-27 NOTE — ED Triage Notes (Signed)
Patient c/o pain in her upper part of her leg that goes down into her lower leg.  Patient states that her pain has been off and on since Jan.  Patient states that the pain got worse today.  Patient has applied ice and heat and took tylenol with no relief.  Patient denies injury or fall.

## 2022-11-27 NOTE — Discharge Instructions (Signed)
Take the ibuprofen, 600 mg every 6 hours with food, on a schedule for the next 48 hours and then as needed.  Take the baclofen, 10 mg every 8 hours, on a schedule for the next 48 hours and then as needed.  Apply moist heat to your thigh for 30 minutes at a time 2-3 times a day to improve blood flow to the area and help remove the lactic acid causing the spasm.  Follow the thigh exercises given at discharge.  Return for reevaluation for any new or worsening symptoms.

## 2022-11-27 NOTE — ED Provider Notes (Signed)
[facility]    CSN: [id number] Arrival date & time: 11/27/22  1449      History   Chief Complaint Chief Complaint  Patient presents with   Leg Pain    right    HPI Ariana Jacobs is a 57 y.o. female.   HPI  57 year old female with a past medical history significant for hypertension diabetes presents for evaluation of pain in the right quadricep that is been off and on since January and worsening over the last 1 to 2 days.  She has taken over-the-counter Tylenol and applied ice and heat without any improvement of her symptoms.  She denies any injury.  No change in physical activity or heavy lifting.  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  Past Medical History:  Diagnosis Date   Diabetes mellitus without complication ([place])    Hypertension     There are no problems to display for this patient.   Past Surgical History:  Procedure Laterality Date   CESAREAN SECTION     FOOT SURGERY      OB History   No obstetric history on file.      Home Medications    Prior to Admission medications   Medication Sig Start Date End Date Taking? Authorizing Provider  baclofen (LIORESAL) 10 MG tablet Take 1 tablet (10 mg total) by mouth 3 (three) times daily. 11/27/22  Yes Margarette Canada, NP  ibuprofen (ADVIL) 600 MG tablet Take 1 tablet (600 mg total) by mouth every 6 (six) hours as needed. 11/27/22  Yes Margarette Canada, NP  amLODipine (NORVASC) 5 MG tablet  01/22/19   [provider]  atorvastatin (LIPITOR) 40 MG tablet Take 40 mg by mouth daily as needed. 08/16/21   [provider]  Butenafine HCl (LOTRIMIN ULTRA) 1 % cream Apply on both feet bid x 2-4 weeks 05/30/22   Rodriguez-Southworth, Sunday Spillers, PA-C  ipratropium (ATROVENT) 0.06 % nasal spray Place 2 sprays into both nostrils 4 (four) times daily. 10/11/21   Margarette Canada, NP  JARDIANCE 10 MG TABS tablet Take 10 mg by mouth daily. 07/12/22   [provider]  LEVEMIR FLEXTOUCH 100 UNIT/ML  Pen  02/12/19   [provider]  lisinopril-hydrochlorothiazide (ZESTORETIC) 20-25 MG tablet  01/08/19   [provider]  metFORMIN (GLUCOPHAGE) 1000 MG tablet 2 (two) times daily with a meal.  02/04/19   [provider]  promethazine-dextromethorphan (PROMETHAZINE-DM) 6.25-15 MG/5ML syrup Take 5 mLs by mouth 4 (four) times daily as needed. 09/12/22   Brimage, Ronnette Juniper, DO  tiZANidine (ZANAFLEX) 4 MG capsule Take 1 capsule (4 mg total) by mouth at bedtime as needed for muscle spasms. 07/14/22   Raspet, Erin K, PA-C  GLIPIZIDE PO Take by mouth daily.  09/20/19  [provider]  HYDRALAZINE-HCTZ PO Take by mouth daily.  04/07/20  [provider]  hydrochlorothiazide (HYDRODIURIL) 25 MG tablet  01/01/19 09/20/19  [provider]  LISINOPRIL PO Take by mouth daily.  09/20/19  [provider]    Family History Family History  Problem Relation Age of Onset   Healthy Mother    Diabetes Father    Breast cancer Neg Hx     Social History Social History   Tobacco Use   Smoking status: Never   Smokeless tobacco: Never  Vaping Use   Vaping Use: Never used  Substance Use Topics   Alcohol use: Yes    Comment: Occasionally   Drug use: No  Allergies   Patient has no known allergies.   Review of Systems Review of Systems  Musculoskeletal:  Positive for myalgias.  Skin:  Negative for color change.  Neurological:  Negative for weakness and numbness.     Physical Exam Triage Vital Signs ED Triage Vitals [11/27/22 1504]  Enc Vitals Group     BP      Pulse      Resp      Temp      Temp src      SpO2      Weight 113 lb (51.3 kg)     Height 4\' 11"  (1.499 m)     Head Circumference      Peak Flow      Pain Score 10     Pain Loc      Pain Edu?      Excl. in [place]?    No data found.  Updated Vital Signs BP (!) 142/80 (BP Location: Right Arm)   Pulse 89   Temp 97.9 F (36.6 C) (Oral)   Resp 14   Ht 4\' 11"  (1.499 m)   Wt 113 lb  (51.3 kg)   SpO2 97%   BMI 22.82 kg/m   Visual Acuity Right Eye Distance:   Left Eye Distance:   Bilateral Distance:    Right Eye Near:   Left Eye Near:    Bilateral Near:     Physical Exam Vitals and nursing note reviewed.  Constitutional:      Appearance: Normal appearance. She is not ill-appearing.  Musculoskeletal:        General: Tenderness present. No swelling, deformity or signs of injury. Normal range of motion.  Skin:    General: Skin is warm and dry.     Capillary Refill: Capillary refill takes less than 2 seconds.  Neurological:     Mental Status: She is alert.      UC Treatments / Results  Labs (all labs ordered are listed, but only abnormal results are displayed) Labs Reviewed - No data to display  EKG   Radiology No results found.  Procedures Procedures (including critical care time)  Medications Ordered in UC Medications - No data to display  Initial Impression / Assessment and Plan / UC Course  I have reviewed the triage vital signs and the nursing notes.  Pertinent labs & imaging results that were available during my care of the patient were reviewed by me and considered in my medical decision making (see chart for details).   Patient is a pleasant, nontoxic-appearing 57 year old female presenting for evaluation of pain in her right quadricep as outlined in HPI above.  On exam patient has significant spasm to the quadricep muscle group that extends laterally to the IT band.  This pain worsens when she actively extends her lower leg.  No pain in the anterior thigh.  Her exam is consistent with quadricep strain and muscle spasm.  I given her home physical therapy exercises to do and I will treat her with combination of ibuprofen and baclofen to help with inflammation and muscle spasm.  She may continue the moist heat and have also suggested she use compression shorts to help provide some support to the muscle.  Return precautions reviewed.   Final  Clinical Impressions(s) / UC Diagnoses   Final diagnoses:  Quadriceps strain, right, initial encounter     Discharge Instructions      Take the ibuprofen, 600 mg every 6 hours with food, on a schedule  for the next 48 hours and then as needed.  Take the baclofen, 10 mg every 8 hours, on a schedule for the next 48 hours and then as needed.  Apply moist heat to your thigh for 30 minutes at a time 2-3 times a day to improve blood flow to the area and help remove the lactic acid causing the spasm.  Follow the thigh exercises given at discharge.  Return for reevaluation for any new or worsening symptoms.      ED Prescriptions     Medication Sig Dispense Auth. Provider   ibuprofen (ADVIL) 600 MG tablet Take 1 tablet (600 mg total) by mouth every 6 (six) hours as needed. 30 tablet Margarette Canada, NP   baclofen (LIORESAL) 10 MG tablet Take 1 tablet (10 mg total) by mouth 3 (three) times daily. 66 each Margarette Canada, NP      I have reviewed the PDMP during this encounter.   Margarette Canada, NP 11/27/22 [phone]

## 2022-12-05 ENCOUNTER — Emergency Department: Payer: 59

## 2022-12-05 ENCOUNTER — Other Ambulatory Visit: Payer: Self-pay

## 2022-12-05 ENCOUNTER — Encounter: Payer: Self-pay | Admitting: Emergency Medicine

## 2022-12-05 ENCOUNTER — Emergency Department
Admission: EM | Admit: 2022-12-05 | Discharge: 2022-12-05 | Disposition: A | Discharge status: Home / Self Care | Payer: 59 | Attending: Emergency Medicine | Admitting: Emergency Medicine

## 2022-12-05 DIAGNOSIS — M545 Low back pain, unspecified: Secondary | ICD-10-CM | POA: Diagnosis present

## 2022-12-05 DIAGNOSIS — E119 Type 2 diabetes mellitus without complications: Secondary | ICD-10-CM | POA: Diagnosis not present

## 2022-12-05 DIAGNOSIS — M79604 Pain in right leg: Secondary | ICD-10-CM | POA: Diagnosis not present

## 2022-12-05 DIAGNOSIS — M5416 Radiculopathy, lumbar region: Secondary | ICD-10-CM | POA: Diagnosis not present

## 2022-12-05 DIAGNOSIS — I10 Essential (primary) hypertension: Secondary | ICD-10-CM | POA: Diagnosis not present

## 2022-12-05 MED ORDER — METHYLPREDNISOLONE 4 MG PO TBPK: ORAL_TABLET | ORAL | 0 refills | Status: DC

## 2022-12-05 MED ORDER — KETOROLAC TROMETHAMINE 30 MG/ML IJ SOLN
30.0000 mg | Freq: Once | INTRAMUSCULAR | Status: AC
  Administered 2022-12-05: 30 mg via INTRAMUSCULAR
  Filled 2022-12-05: qty 1

## 2022-12-05 MED ORDER — METHOCARBAMOL 750 MG PO TABS: 750.0000 mg | ORAL_TABLET | Freq: Four times a day (QID) | ORAL | 0 refills | Status: DC

## 2022-12-05 NOTE — ED Provider Notes (Signed)
[facility] Provider Note    Event Date/Time   First MD Initiated Contact with Patient 12/05/22 1440     (approximate)   History   Leg Pain and Back Pain   HPI  Ariana Jacobs is a 57 y.o. female with history of hypertension and diabetes presents emergency department complaining of continued right-sided lower back pain that radiates all the way to the right leg and right foot.  Patient states that she was seen at urgent care and given ibuprofen and a muscle relaxer.  States it has not helped.  Now she is having trouble walking because of the pain.  No known injury.      Physical Exam   Triage Vital Signs: ED Triage Vitals  Enc Vitals Group     BP 12/05/22 1409 (!) 161/98     Pulse Rate 12/05/22 1409 83     Resp 12/05/22 1409 16     Temp 12/05/22 1411 98.1 F (36.7 C)     Temp Source 12/05/22 1411 Oral     SpO2 12/05/22 1409 96 %     Weight 12/05/22 1410 113 lb (51.3 kg)     Height 12/05/22 1410 4\' 11"  (1.499 m)     Head Circumference --      Peak Flow --      Pain Score 12/05/22 1410 10     Pain Loc --      Pain Edu? --      Excl. in [place]? --     Most recent vital signs: Vitals:   12/05/22 1409 12/05/22 1411  BP: (!) 161/98   Pulse: 83   Resp: 16   Temp:  98.1 F (36.7 C)  SpO2: 96%      General: Awake, no distress.   CV:  Good peripheral perfusion. regular rate and  rhythm Resp:  Normal effort.  Abd:  No distention.   Other:  Tenderness along the lumbar spine and SI joint, 5 or 5 strength lower extremities   ED Results / Procedures / Treatments   Labs (all labs ordered are listed, but only abnormal results are displayed) Labs Reviewed - No data to display   EKG     RADIOLOGY X-ray lumbar spine    PROCEDURES:   Procedures   MEDICATIONS ORDERED IN ED: Medications  ketorolac (TORADOL) 30 MG/ML injection 30 mg (30 mg Intramuscular Given 12/05/22 1457)     IMPRESSION / MDM / [place] / ED COURSE   I reviewed the triage vital signs and the nursing notes.                              Differential diagnosis includes, but is not limited to, lumbar radiculopathy, sciatica, bulging disc, cauda equina  Patient's presentation is most consistent with acute complicated illness / injury requiring diagnostic workup.   Patient does not present as cauda equina as she has not had any loss of bowel or bladder control and has full strength in lower extremities.  However we will do a plain film of the lumbar spine to evaluate for arthritis etc.  I did review the x-ray of the lumbar spine.  Interpret this is having disc height loss at L5-S1.  Confirmed by radiology  Did explain the findings to the patient.  She was given Toradol 30 mg IM and has had some relief with medication.  Feel that she would benefit from a steroid  pack and I will change her muscle relaxer.  Patient is in agreement with treatment plan.  She is to apply ice to the lower back.  Return emergency department worsening.  Follow-up with neurosurgery if not improving with these medications.  She is in agreement.  Discharged stable condition.     FINAL CLINICAL IMPRESSION(S) / ED DIAGNOSES   Final diagnoses:  Lumbar radiculopathy     Rx / DC Orders   ED Discharge Orders          Ordered    methylPREDNISolone (MEDROL DOSEPAK) 4 MG TBPK tablet        12/05/22 1551    methocarbamol (ROBAXIN-750) 750 MG tablet  4 times daily        12/05/22 1551             Note:  This document was prepared using Dragon voice recognition software and may include unintentional dictation errors.    Versie Starks, PA-C 12/05/22 1553    Duffy Bruce, MD 12/05/22 2135

## 2022-12-05 NOTE — Discharge Instructions (Signed)
Follow-up with Dr. Nelly Laurence office if not improving in 1 week.  Take medication as prescribed.  Apply ice.  Do not use heat.  Return emergency department worsening

## 2022-12-05 NOTE — ED Triage Notes (Signed)
Pt to ED via POV c/o lower back pain that radiates into her right leg. Pt states that she was seen at Urgent care for same on 3/24 but states that she is still not better. Pt is having trouble walking because of the pain. Pt is currently in NAD.

## 2022-12-05 NOTE — ED Notes (Signed)
64 yof with a c/c of back pain for at least a month now. The pt advised she is tired of dealing with the pain and wanted to come in to be checked out. The pt is alert and oriented x 4. The pt is warm, pink, and dry.

## 2022-12-05 NOTE — ED Notes (Signed)
This RN went into room to review DC info. Pt not in room or seen in department.

## 2022-12-14 ENCOUNTER — Other Ambulatory Visit: Payer: Self-pay

## 2022-12-14 ENCOUNTER — Emergency Department
Admission: EM | Admit: 2022-12-14 | Discharge: 2022-12-14 | Disposition: A | Discharge status: Home / Self Care | Payer: 59 | Attending: Emergency Medicine | Admitting: Emergency Medicine

## 2022-12-14 DIAGNOSIS — M5441 Lumbago with sciatica, right side: Secondary | ICD-10-CM | POA: Insufficient documentation

## 2022-12-14 DIAGNOSIS — I10 Essential (primary) hypertension: Secondary | ICD-10-CM | POA: Diagnosis not present

## 2022-12-14 DIAGNOSIS — E119 Type 2 diabetes mellitus without complications: Secondary | ICD-10-CM | POA: Diagnosis not present

## 2022-12-14 DIAGNOSIS — M5431 Sciatica, right side: Secondary | ICD-10-CM

## 2022-12-14 DIAGNOSIS — Z79899 Other long term (current) drug therapy: Secondary | ICD-10-CM | POA: Diagnosis not present

## 2022-12-14 DIAGNOSIS — M545 Low back pain, unspecified: Secondary | ICD-10-CM | POA: Diagnosis present

## 2022-12-14 MED ORDER — METHYLPREDNISOLONE 4 MG PO TBPK: ORAL_TABLET | ORAL | 0 refills | Status: DC

## 2022-12-14 MED ORDER — KETOROLAC TROMETHAMINE 60 MG/2ML IM SOLN
30.0000 mg | Freq: Once | INTRAMUSCULAR | Status: AC
  Administered 2022-12-14: 30 mg via INTRAMUSCULAR
  Filled 2022-12-14: qty 2

## 2022-12-14 MED ORDER — METHOCARBAMOL 750 MG PO TABS: 750.0000 mg | ORAL_TABLET | Freq: Four times a day (QID) | ORAL | 0 refills | Status: DC | PRN

## 2022-12-14 NOTE — ED Provider Notes (Signed)
   [facility] Provider Note    Event Date/Time   First MD Initiated Contact with Patient 12/14/22 [phone]     (approximate)   History   Leg Pain   HPI  Ariana Jacobs is a 57 y.o. female with history of diabetes and hypertension and as listed in EMR presents to the emergency department for treatment and evaluation of right side low back pain that radiates into the right lower extremity.  She had been evaluated for this earlier in the month and had gotten better while on the steroid, but pain returned. Marland Kitchen       Physical Exam   Triage Vital Signs:  Today's Vitals   12/14/22 0910 12/14/22 0916 12/14/22 0931  BP:  (!) 168/64   Pulse:  75   Resp:  16   Temp:  97.9 F (36.6 C)   SpO2:  98%   PainSc: 8   2    There is no height or weight on file to calculate BMI.   Most recent vital signs: Vitals:   12/14/22 0916  BP: (!) 168/64  Pulse: 75  Resp: 16  Temp: 97.9 F (36.6 C)  SpO2: 98%    General: Awake, no distress.  CV:  Good peripheral perfusion.  Resp:  Normal effort.  Abd:  No distention.  Other:  Right lower extremity pain with leg raise and bending at the waist.   ED Results / Procedures / Treatments   Labs (all labs ordered are listed, but only abnormal results are displayed) Labs Reviewed - No data to display   EKG     RADIOLOGY    PROCEDURES:  Critical Care performed: No  Procedures   MEDICATIONS ORDERED IN ED:  Medications  ketorolac (TORADOL) injection 30 mg (30 mg Intramuscular Given 12/14/22 0926)     IMPRESSION / MDM / ASSESSMENT AND PLAN / ED COURSE   I have reviewed the triage note.  Differential diagnosis includes, but is not limited to, sciatica, lumbar strain, radiculopathy  Patient's presentation is most consistent with acute illness / injury with system symptoms.  57 year old female presenting to the emergency department for treatment and evaluation of right side back pain that radiates into  the right lower extremity. Plan will be to treat with medrol dosepak and Robaxin. She is to follow up with orthopedics if not improving over the week.      FINAL CLINICAL IMPRESSION(S) / ED DIAGNOSES   Final diagnoses:  Sciatica of right side     Rx / DC Orders   ED Discharge Orders          Ordered    methylPREDNISolone (MEDROL DOSEPAK) 4 MG TBPK tablet        12/14/22 0927    methocarbamol (ROBAXIN-750) 750 MG tablet  Every 6 hours PRN        12/14/22 6387             Note:  This document was prepared using Dragon voice recognition software and may include unintentional dictation errors.   Chinita Pester, FNP 12/14/22 1312    Georga Hacking, MD 12/15/22 [phone]

## 2022-12-14 NOTE — Discharge Instructions (Signed)
Please follow-up with your primary care provider or the Ortho pedis listed above for symptoms that are not improving over the week.  If your symptoms change or worsen and you are unable to schedule an appointment, please return to the emergency department.

## 2022-12-14 NOTE — ED Triage Notes (Signed)
Pt here POV with c/o of R leg pain, pt states seen for same and DX'ed with pinched nerve. NAD noted. Pt here asking for refill on pain medicine.

## 2023-01-05 ENCOUNTER — Ambulatory Visit
Admission: EM | Admit: 2023-01-05 | Discharge: 2023-01-05 | Disposition: A | Discharge status: Home / Self Care | Payer: 59 | Attending: Physician Assistant | Admitting: Physician Assistant

## 2023-01-05 DIAGNOSIS — R0981 Nasal congestion: Secondary | ICD-10-CM

## 2023-01-05 DIAGNOSIS — J309 Allergic rhinitis, unspecified: Secondary | ICD-10-CM

## 2023-01-05 DIAGNOSIS — R051 Acute cough: Secondary | ICD-10-CM | POA: Diagnosis not present

## 2023-01-05 MED ORDER — IPRATROPIUM BROMIDE 0.06 % NA SOLN: 2.0000 | Freq: Four times a day (QID) | NASAL | 0 refills | Status: DC

## 2023-01-05 MED ORDER — PROMETHAZINE-DM 6.25-15 MG/5ML PO SYRP: 5.0000 mL | ORAL_SOLUTION | Freq: Four times a day (QID) | ORAL | 0 refills | Status: DC | PRN

## 2023-01-05 NOTE — ED Triage Notes (Signed)
Pt reports her eyes itchy, throat itchy, body aches, throat soreness, and dizziness x 1 week but has increased today. Took cold and flu meds but no relief.

## 2023-01-05 NOTE — ED Provider Notes (Signed)
[facility]    CSN: [id number] Arrival date & time: 01/05/23  1016      History   Chief Complaint No chief complaint on file.   HPI Ariana Jacobs is a 57 y.o. female presenting for fatigue, body aches, nasal congestion, runny nose, itchy and watery eyes. Reports sore throat that has recently worsened. Describes it as scratchy. Throat pain is 7-8/10. Denies fever, chest pain, wheezing, shortness of breath. Believes symptoms are due to allergies. Tried OTC decongestants without relief. Reports worsening nasal congestion. Says "I can't breathe out of my nose." No sick contacts. No other concerns.  HPI  Past Medical History:  Diagnosis Date   Diabetes mellitus without complication ([facility])    Hypertension     There are no problems to display for this patient.   Past Surgical History:  Procedure Laterality Date   CESAREAN SECTION     FOOT SURGERY      OB History   No obstetric history on file.      Home Medications    Prior to Admission medications   Medication Sig Start Date End Date Taking? Authorizing Provider  ipratropium (ATROVENT) 0.06 % nasal spray Place 2 sprays into both nostrils 4 (four) times daily. 01/05/23  Yes Eusebio Friendly B, PA-C  amLODipine (NORVASC) 5 MG tablet  01/22/19   [provider]  atorvastatin (LIPITOR) 40 MG tablet Take 40 mg by mouth daily as needed. 08/16/21   [provider]  Butenafine HCl (LOTRIMIN ULTRA) 1 % cream Apply on both feet bid x 2-4 weeks 05/30/22   Rodriguez-Southworth, Nettie Elm, PA-C  JARDIANCE 10 MG TABS tablet Take 10 mg by mouth daily. 07/12/22   [provider]  LEVEMIR FLEXTOUCH 100 UNIT/ML Pen  02/12/19   [provider]  lisinopril-hydrochlorothiazide (ZESTORETIC) 20-25 MG tablet  01/08/19   [provider]  metFORMIN (GLUCOPHAGE) 1000 MG tablet 2 (two) times daily with a meal.  02/04/19   [provider]  methocarbamol (ROBAXIN-750) 750 MG tablet Take 1 tablet (750  mg total) by mouth every 6 (six) hours as needed for muscle spasms. 12/14/22   Triplett, Rulon Eisenmenger B, FNP  methylPREDNISolone (MEDROL DOSEPAK) 4 MG TBPK tablet Take 6 pills on day one then decrease by 1 pill each day 12/14/22   Kem Boroughs B, FNP  promethazine-dextromethorphan (PROMETHAZINE-DM) 6.25-15 MG/5ML syrup Take 5 mLs by mouth 4 (four) times daily as needed for cough. 01/05/23   Eusebio Friendly B, PA-C  GLIPIZIDE PO Take by mouth daily.  09/20/19  [provider]  HYDRALAZINE-HCTZ PO Take by mouth daily.  04/07/20  [provider]  hydrochlorothiazide (HYDRODIURIL) 25 MG tablet  01/01/19 09/20/19  [provider]  LISINOPRIL PO Take by mouth daily.  09/20/19  [provider]    Family History Family History  Problem Relation Age of Onset   Healthy Mother    Diabetes Father    Breast cancer Neg Hx     Social History Social History   Tobacco Use   Smoking status: Never   Smokeless tobacco: Never  Vaping Use   Vaping Use: Never used  Substance Use Topics   Alcohol use: Yes    Comment: Occasionally   Drug use: No     Allergies   Patient has no known allergies.   Review of Systems Review of Systems  Constitutional:  Positive for fatigue. Negative for chills, diaphoresis and fever.  HENT:  Positive for congestion, rhinorrhea and sore throat. Negative for  ear pain, sinus pressure and sinus pain.   Eyes:  Positive for itching.  Respiratory:  Positive for cough. Negative for shortness of breath.   Cardiovascular:  Negative for chest pain.  Gastrointestinal:  Negative for abdominal pain, nausea and vomiting.  Musculoskeletal:  Positive for myalgias.  Skin:  Negative for rash.  Neurological:  Negative for weakness and headaches.  Hematological:  Negative for adenopathy.     Physical Exam Triage Vital Signs ED Triage Vitals  Enc Vitals Group     BP      Pulse      Resp      Temp      Temp src      SpO2      Weight      Height      Head  Circumference      Peak Flow      Pain Score      Pain Loc      Pain Edu?      Excl. in [place]?    No data found.  Updated Vital Signs BP 122/78 (BP Location: Right Arm)   Pulse (!) 102   Temp 98.9 F (37.2 C) (Oral)   Resp 18   SpO2 98%    Physical Exam Vitals and nursing note reviewed.  Constitutional:      General: She is not in acute distress.    Appearance: Normal appearance. She is not ill-appearing or toxic-appearing.     Comments: Appears somewhat fatigued  HENT:     Head: Normocephalic and atraumatic.     Right Ear: Tympanic membrane, ear canal and external ear normal.     Left Ear: Tympanic membrane, ear canal and external ear normal.     Nose: Congestion present.     Mouth/Throat:     Mouth: Mucous membranes are moist.     Pharynx: Oropharynx is clear. Posterior oropharyngeal erythema present.  Eyes:     General: No scleral icterus.       Right eye: No discharge.        Left eye: No discharge.     Conjunctiva/sclera: Conjunctivae normal.  Cardiovascular:     Rate and Rhythm: Regular rhythm. Tachycardia present.     Heart sounds: Normal heart sounds.  Pulmonary:     Effort: Pulmonary effort is normal. No respiratory distress.     Breath sounds: Normal breath sounds.  Musculoskeletal:     Cervical back: Neck supple.  Skin:    General: Skin is dry.  Neurological:     General: No focal deficit present.     Mental Status: She is alert. Mental status is at baseline.     Motor: No weakness.     Gait: Gait normal.  Psychiatric:        Mood and Affect: Mood normal.        Behavior: Behavior normal.        Thought Content: Thought content normal.      UC Treatments / Results  Labs (all labs ordered are listed, but only abnormal results are displayed) Labs Reviewed - No data to display  EKG   Radiology No results found.  Procedures Procedures (including critical care time)  Medications Ordered in UC Medications - No data to display  Initial  Impression / Assessment and Plan / UC Course  I have reviewed the triage vital signs and the nursing notes.  Pertinent labs & imaging results that were available during my care of the patient were  reviewed by me and considered in my medical decision making (see chart for details).   57 y/o female presents for nasal congestion, dry cough, scratchy throat, itchy and watery eyes x 1 week, worsening symptoms over the past day.  Denies fever or breathing difficulty, vomiting or diarrhea.  No COVID, flu exposure.  Patient is afebrile.  She is not really ill-appearing but does appear somewhat fatigued.  On exam she does have nasal congestion and mild erythema posterior pharynx with clear postnasal drainage.  Chest clear auscultation heart regular rhythm.  Symptoms are most consistent with allergic rhinitis versus a virus.  Will treat at this time with Promethazine DM and Atrovent nasal spray.  Patient just requesting to help with symptoms.  She declines a work note.  Reviewed supportive care.  Reviewed return precautions.  Work note given.   Final Clinical Impressions(s) / UC Diagnoses   Final diagnoses:  Allergic rhinitis, unspecified seasonality, unspecified trigger  Nasal congestion  Acute cough     Discharge Instructions      -Symptoms are most consistent with allergies. I have sent medication to the pharmacy. Increase rest and fluids -If you develop fever, worsening sore throat, shortness of breath, etc please return for re-evaluation.     ED Prescriptions     Medication Sig Dispense Auth. Provider   promethazine-dextromethorphan (PROMETHAZINE-DM) 6.25-15 MG/5ML syrup Take 5 mLs by mouth 4 (four) times daily as needed for cough. 118 mL Eusebio Friendly B, PA-C   ipratropium (ATROVENT) 0.06 % nasal spray Place 2 sprays into both nostrils 4 (four) times daily. 15 mL Shirlee Latch, PA-C      PDMP not reviewed this encounter.   Shirlee Latch, PA-C 01/05/23 1116

## 2023-01-05 NOTE — Discharge Instructions (Addendum)
-  Symptoms are most consistent with allergies. I have sent medication to the pharmacy. Increase rest and fluids -If you develop fever, worsening sore throat, shortness of breath, etc please return for re-evaluation.

## 2023-02-14 ENCOUNTER — Other Ambulatory Visit: Payer: Self-pay | Admitting: Nurse Practitioner

## 2023-02-14 DIAGNOSIS — Z1231 Encounter for screening mammogram for malignant neoplasm of breast: Secondary | ICD-10-CM

## 2023-03-01 ENCOUNTER — Ambulatory Visit
Admission: EM | Admit: 2023-03-01 | Discharge: 2023-03-01 | Disposition: A | Discharge status: Home / Self Care | Payer: 59 | Attending: Family Medicine | Admitting: Family Medicine

## 2023-03-01 DIAGNOSIS — Z5321 Procedure and treatment not carried out due to patient leaving prior to being seen by health care provider: Secondary | ICD-10-CM | POA: Diagnosis present

## 2023-03-01 LAB — URINALYSIS, W/ REFLEX TO CULTURE (INFECTION SUSPECTED)
Bilirubin Urine: NEGATIVE
Glucose, UA: 500 mg/dL — AB
Hgb urine dipstick: NEGATIVE
Ketones, ur: NEGATIVE mg/dL
Leukocytes,Ua: NEGATIVE
Nitrite: NEGATIVE
Protein, ur: NEGATIVE mg/dL
Specific Gravity, Urine: 1.01 (ref 1.005–1.030)
pH: 6 (ref 5.0–8.0)

## 2023-03-01 LAB — GLUCOSE, CAPILLARY: Glucose-Capillary: 371 mg/dL — ABNORMAL HIGH (ref 70–99)

## 2023-03-01 NOTE — ED Provider Notes (Signed)
  [facility]    CSN: [id number] Arrival date & time: 03/01/23  1215      History   Chief Complaint Chief Complaint  Patient presents with   Flank Pain    LT sided   Abdominal Pain    Ariana Jacobs presents for left sided flank pain and nausea. Denied dysuria and history of kidney stones and sciatica. She is a diabetic.  Pt left as she needed to go to work. Prior to her leaving a UA and CBG were obtained. Glucosuria likely 2/2 to Jardiance. She had glucosuria and is hyperglycemia 371. VSS.  Katha Cabal, DO    [place], Seward Meth, DO 03/01/23 1358

## 2023-03-01 NOTE — ED Notes (Signed)
Pt left during the visit and stated she had to go to work.

## 2023-03-01 NOTE — ED Triage Notes (Addendum)
Pt presents to UC c/o LT sided flank pain onset today, pt had some nausea yesterday. Pt denies any dysuria, denies any hx of kidney stones. Pt reports she has had a pinched nerve in the past sciatica. Pt reports her pain now does radiate into RT leg and does go numb at times. Pt reports last bowel movement was x2-3 days ago, also c/o abdominal pain mostly lower.

## 2023-03-02 ENCOUNTER — Ambulatory Visit
Admission: EM | Admit: 2023-03-02 | Discharge: 2023-03-02 | Disposition: A | Discharge status: Home / Self Care | Payer: 59 | Attending: Physician Assistant | Admitting: Physician Assistant

## 2023-03-02 DIAGNOSIS — B3731 Acute candidiasis of vulva and vagina: Secondary | ICD-10-CM | POA: Insufficient documentation

## 2023-03-02 DIAGNOSIS — E1165 Type 2 diabetes mellitus with hyperglycemia: Secondary | ICD-10-CM

## 2023-03-02 DIAGNOSIS — G8929 Other chronic pain: Secondary | ICD-10-CM | POA: Diagnosis present

## 2023-03-02 DIAGNOSIS — B9689 Other specified bacterial agents as the cause of diseases classified elsewhere: Secondary | ICD-10-CM

## 2023-03-02 DIAGNOSIS — N760 Acute vaginitis: Secondary | ICD-10-CM

## 2023-03-02 DIAGNOSIS — M5441 Lumbago with sciatica, right side: Secondary | ICD-10-CM | POA: Diagnosis present

## 2023-03-02 DIAGNOSIS — Z7985 Long-term (current) use of injectable non-insulin antidiabetic drugs: Secondary | ICD-10-CM

## 2023-03-02 LAB — WET PREP, GENITAL
Sperm: NONE SEEN
Trich, Wet Prep: NONE SEEN
WBC, Wet Prep HPF POC: <10 (ref <10)

## 2023-03-02 MED ORDER — NAPROXEN 375 MG PO TABS: 375.0000 mg | ORAL_TABLET | Freq: Two times a day (BID) | ORAL | 1 refills | Status: DC | PRN

## 2023-03-02 MED ORDER — KETOROLAC TROMETHAMINE 60 MG/2ML IM SOLN
30.0000 mg | Freq: Once | INTRAMUSCULAR | Status: AC
  Administered 2023-03-02: 30 mg via INTRAMUSCULAR

## 2023-03-02 MED ORDER — FLUCONAZOLE 150 MG PO TABS: 150.0000 mg | ORAL_TABLET | ORAL | 0 refills | Status: DC

## 2023-03-02 MED ORDER — GABAPENTIN (ONCE-DAILY) 300 MG PO TABS: 300.0000 mg | ORAL_TABLET | Freq: Every day | ORAL | 1 refills | Status: DC

## 2023-03-02 MED ORDER — BACLOFEN 10 MG PO TABS: 10.0000 mg | ORAL_TABLET | Freq: Three times a day (TID) | ORAL | 0 refills | Status: DC | PRN

## 2023-03-02 MED ORDER — METRONIDAZOLE 500 MG PO TABS: 500.0000 mg | ORAL_TABLET | Freq: Two times a day (BID) | ORAL | 0 refills | Status: AC

## 2023-03-02 NOTE — ED Triage Notes (Signed)
Pt presents to UC c/o LT sided flank pain onset today, pt had some nausea yesterday. Pt denies any dysuria, denies any hx of kidney stones. Pt reports she has had a pinched nerve in the past sciatica. Pt reports her pain now does radiate into RT leg and does go numb at times. Pt reports last bowel movement was x2-3 days ago, also c/o abdominal pain mostly lower.  

## 2023-03-02 NOTE — ED Provider Notes (Signed)
[facility]    CSN: [id number] Arrival date & time: 03/02/23  4098      History   Chief Complaint Chief Complaint  Patient presents with   Leg Pain   Dysuria    HPI Ariana Jacobs is a 57 y.o. female presenting for right lower back and leg pain for the past couple of days. Pain radiates down right leg and reports occasional numbness. Seen here yesterday, but left before being evaluated by provider. She has a history of chronic back pain and recurrent sciatica.  She reports she normally goes to the emergency department or urgent care to be treated.  She has never been seen by an orthopedist. Patient reports taking ibuprofen and tylenol for sciatic pain. Reports receiving injections in the past which have been helpful.  She is also reporting slight burning with urination that started yesterday.  A urinalysis yesterday did not show any evidence of UTI.  She reports vaginal itching without discharge.  Blood sugars have been high.  Blood sugar yesterday was over 300. Patient has history of Type 2 DM. States she sees a PCP at [facility] who manges her diabetes. She does admit she does not always take it like she is supposed to.  She says she saw her PCP not too long ago and was supposed to have lab work performed but has not gone back to have that done.  Patient states that he has problems with chronic constipation and has to give herself suppositories frequently.  She states that she has been more constipated recently.  Last bowel movement was 2 days ago.  HPI  Past Medical History:  Diagnosis Date   Diabetes mellitus without complication (HCC)    Hypertension     There are no problems to display for this patient.   Past Surgical History:  Procedure Laterality Date   CESAREAN SECTION     FOOT SURGERY      OB History   No obstetric history on file.      Home Medications    Prior to Admission medications   Medication Sig Start Date End Date Taking?  Authorizing Provider  amLODipine (NORVASC) 5 MG tablet  01/22/19  Yes [provider]  atorvastatin (LIPITOR) 40 MG tablet Take 40 mg by mouth daily as needed. 08/16/21  Yes [provider]  baclofen (LIORESAL) 10 MG tablet Take 1 tablet (10 mg total) by mouth 3 (three) times daily as needed for muscle spasms. 03/02/23  Yes Shirlee Latch, PA-C  Butenafine HCl (LOTRIMIN ULTRA) 1 % cream Apply on both feet bid x 2-4 weeks 05/30/22  Yes Rodriguez-Southworth, Nettie Elm, PA-C  fluconazole (DIFLUCAN) 150 MG tablet Take 1 tablet (150 mg total) by mouth every 3 (three) days. 03/02/23  Yes Eusebio Friendly B, PA-C  Gabapentin, Once-Daily, 300 MG TABS Take 1 tablet (300 mg total) by mouth at bedtime. 03/02/23 04/01/23 Yes Eusebio Friendly B, PA-C  ipratropium (ATROVENT) 0.06 % nasal spray Place 2 sprays into both nostrils 4 (four) times daily. 01/05/23  Yes Shirlee Latch, PA-C  JARDIANCE 10 MG TABS tablet Take 10 mg by mouth daily. 07/12/22  Yes [provider]  LEVEMIR FLEXTOUCH 100 UNIT/ML Pen  02/12/19  Yes [provider]  lisinopril-hydrochlorothiazide (ZESTORETIC) 20-25 MG tablet  01/08/19  Yes [provider]  metFORMIN (GLUCOPHAGE) 1000 MG tablet 2 (two) times daily with a meal.  02/04/19  Yes [provider]  metroNIDAZOLE (FLAGYL) 500 MG tablet Take 1 tablet (  500 mg total) by mouth 2 (two) times daily for 7 days. 03/02/23 03/09/23 Yes Shirlee Latch, PA-C  naproxen (NAPROSYN) 375 MG tablet Take 1 tablet (375 mg total) by mouth 2 (two) times daily as needed for moderate pain. 03/02/23  Yes Eusebio Friendly B, PA-C  GLIPIZIDE PO Take by mouth daily.  09/20/19  [provider]  HYDRALAZINE-HCTZ PO Take by mouth daily.  04/07/20  [provider]  hydrochlorothiazide (HYDRODIURIL) 25 MG tablet  01/01/19 09/20/19  [provider]  LISINOPRIL PO Take by mouth daily.  09/20/19  [provider]    Family History Family History  Problem Relation  Age of Onset   Healthy Mother    Diabetes Father    Breast cancer Neg Hx     Social History Social History   Tobacco Use   Smoking status: Never   Smokeless tobacco: Never  Vaping Use   Vaping Use: Never used  Substance Use Topics   Alcohol use: Yes    Comment: Occasionally   Drug use: No     Allergies   Patient has no known allergies.   Review of Systems Review of Systems  Constitutional:  Negative for appetite change, fatigue and fever.  Respiratory:  Negative for shortness of breath.   Cardiovascular:  Negative for chest pain.  Gastrointestinal:  Positive for abdominal pain and constipation. Negative for blood in stool, diarrhea, nausea and vomiting.  Genitourinary:  Positive for dysuria. Negative for difficulty urinating, frequency, hematuria, urgency, vaginal discharge and vaginal pain.  Musculoskeletal:  Positive for back pain. Negative for gait problem and myalgias.  Neurological:  Negative for weakness.     Physical Exam Triage Vital Signs ED Triage Vitals  Enc Vitals Group     BP 09/30/20 1436 139/82     Pulse Rate 09/30/20 1436 74     Resp 09/30/20 1436 18     Temp 09/30/20 1436 98.1 F (36.7 C)     Temp Source 09/30/20 1436 Oral     SpO2 09/30/20 1436 99 %     Weight 09/30/20 1434 139 lb 15.9 oz (63.5 kg)     Height 09/30/20 1434 4\' 11"  (1.499 m)     Head Circumference --      Peak Flow --      Pain Score 09/30/20 1434 6     Pain Loc --      Pain Edu? --      Excl. in GC? --    No data found.  Updated Vital Signs BP (!) 144/87 (BP Location: Right Arm)   Pulse 89   Temp 98 F (36.7 C) (Oral)   Resp 16   Ht 4\' 11"  (1.499 m)   Wt 107 lb (48.5 kg)   SpO2 97%   BMI 21.61 kg/m       Physical Exam Vitals and nursing note reviewed.  Constitutional:      General: She is not in acute distress.    Appearance: Normal appearance. She is not ill-appearing or toxic-appearing.  HENT:     Head: Normocephalic and atraumatic.     Nose: Nose  normal.     Mouth/Throat:     Mouth: Mucous membranes are moist.     Pharynx: Oropharynx is clear.  Eyes:     General: No scleral icterus.       Right eye: No discharge.        Left eye: No discharge.     Conjunctiva/sclera: Conjunctivae normal.  Cardiovascular:  Rate and Rhythm: Normal rate and regular rhythm.     Heart sounds: Normal heart sounds.  Pulmonary:     Effort: Pulmonary effort is normal. No respiratory distress.     Breath sounds: Normal breath sounds.  Abdominal:     Palpations: Abdomen is soft.     Tenderness: There is no abdominal tenderness. There is no right CVA tenderness or left CVA tenderness.  Musculoskeletal:     Cervical back: Neck supple.     Lumbar back: Tenderness (right paralumbar region) present. Decreased range of motion. Positive right straight leg raise test. Negative left straight leg raise test.  Skin:    General: Skin is dry.  Neurological:     General: No focal deficit present.     Mental Status: She is alert. Mental status is at baseline.     Motor: No weakness.     Gait: Gait normal.  Psychiatric:        Mood and Affect: Mood normal.        Behavior: Behavior normal.        Thought Content: Thought content normal.      UC Treatments / Results  Labs (all labs ordered are listed, but only abnormal results are displayed) Labs Reviewed  WET PREP, GENITAL - Abnormal; Notable for the following components:      Result Value   Yeast Wet Prep HPF POC PRESENT (*)    Clue Cells Wet Prep HPF POC PRESENT (*)    All other components within normal limits    EKG   Radiology No results found.   Procedures Procedures (including critical care time)  Medications Ordered in UC Medications  ketorolac (TORADOL) injection 30 mg (30 mg Intramuscular Given 03/02/23 0945)    Initial Impression / Assessment and Plan / UC Course  I have reviewed the triage vital signs and the nursing notes.  Pertinent labs & imaging results that were  available during my care of the patient were reviewed by me and considered in my medical decision making (see chart for details).   1. Chronic Lower back pain/right sided sciatica: Back pain is worsened with extension of back. Increased leg pain with SLR.  Suspect musculoskeletal cause for the back pain.  Patient given 30 mg IM ketorolac. Treating with naproxen and baclofen. Gabapentin at night. Strict PCP and ortho follow up. Advised she can also take Tylenol if needed for pain relief.  Advised supportive care with use of heating pad as well.  ED precautions for back pain discussed.  2. Dysuria/vaginal itching: Urinalysis from yesterday is only significant for greater than 500 glucose.  No blood, leukocytes or nitrates.  No protein or ketones.  Wet prep obtained. +clue cells and yeast. Will treat with metronidazole and diflucan.  3. Left lower abdominal pain: Likely related to constipation.  Advised to continue suppositories at home and consider MiraLAX.  Advised to increase daily fiber exercise and fluids.  4. Glucosuria, hyperglycemia, uncontrolled T2DM: Fingerstick in the clinic was 371.  Advised her to continue taking medications as directed and follow-up with PCP at next appointment.  Advised to keep a log of her blood sugars so that she can take to her appointment.  Advised going to ED if she has any worsening abdominal pain, uncontrollable blood sugars, weakness or severe dehydration.   Final Clinical Impressions(s) / UC Diagnoses   Final diagnoses:  Chronic bilateral low back pain with right-sided sciatica  Bacterial vaginosis  Yeast vaginitis  Uncontrolled type 2 diabetes mellitus  with hyperglycemia [facility])     Discharge Instructions      -You do not have a UTI but you have yeast infection and bacterial vaginosis.  I sent antifungal and antibiotic medication to the pharmacy.  Increase rest and fluids. - Back pain is a chronic issue.  You may take the gabapentin at night.  I also sent  naproxen to pharmacy and baclofen.  Follow-up with PCP or orthopedics as soon as possible for evaluation since this is a chronic and ongoing issue.  The most common types of vaginal infections are yeast infections and bacterial vaginosis. Neither of which are really considered to be sexually transmitted. Often a pH swab or wet prep is performed and if abnormal may reveal either type of infection. Begin metronidazole if prescribed for possible BV infection. If there is concern for yeast infection, fluconazole is often prescribed . Take this as directed. You may also apply topical miconazole (can be purchased OTC) externally for relief of itching. Increase rest and fluid intake. If labs sent out, we will call within 2-5 days with results and amend treatment if necessary. Always try to use pH balanced washes/wipes, urinate after intercourse, stay hydrated, and take probiotics if you are prone to vaginal infections. Return or see PCP or gynecologist for new/worsening infections.    BACK PAIN: Stressed avoiding painful activities . RICE (REST, ICE, COMPRESSION, ELEVATION) guidelines reviewed. May alternate ice and heat. Consider use of muscle rubs, Salonpas patches, etc. Use medications as directed including muscle relaxers if prescribed. Take anti-inflammatory medications as prescribed or OTC NSAIDs/Tylenol.  F/u with PCP in 7-10 days for reexamination, and please feel free to call or return to the urgent care at any time for any questions or concerns you may have and we will be happy to help you!   BACK PAIN RED FLAGS: If the back pain acutely worsens or there are any red flag symptoms such as numbness/tingling, leg weakness, saddle anesthesia, or loss of bowel/bladder control, go immediately to the ER. Follow up with [place] as scheduled or sooner if the pain does not begin to resolve or if it worsens before the follow up    You have a condition requiring you to follow up with Orthopedics so please call one of the  following office for appointment:   [facility] [address] [place], [place] [address] Phone: [phone]  There is also a [facility] in [place].  [facility] [address] [place], [place] [address] Phone: [phone]       ED Prescriptions     Medication Sig Dispense Auth. Provider   naproxen (NAPROSYN) 375 MG tablet Take 1 tablet (375 mg total) by mouth 2 (two) times daily as needed for moderate pain. 30 tablet Eusebio Friendly B, PA-C   Gabapentin, Once-Daily, 300 MG TABS Take 1 tablet (300 mg total) by mouth at bedtime. 30 tablet Eusebio Friendly B, PA-C   baclofen (LIORESAL) 10 MG tablet Take 1 tablet (10 mg total) by mouth 3 (three) times daily as needed for muscle spasms. 30 each Shirlee Latch, PA-C   metroNIDAZOLE (FLAGYL) 500 MG tablet Take 1 tablet (500 mg total) by mouth 2 (two) times daily for 7 days. 14 tablet Eusebio Friendly B, PA-C   fluconazole (DIFLUCAN) 150 MG tablet Take 1 tablet (150 mg total) by mouth every 3 (three) days. 2 tablet Shirlee Latch, PA-C      I have reviewed the PDMP during this encounter.  Shirlee Latch, PA-C 03/02/23 1014

## 2023-03-02 NOTE — Discharge Instructions (Addendum)
-  You do not have a UTI but you have yeast infection and bacterial vaginosis.  I sent antifungal and antibiotic medication to the pharmacy.  Increase rest and fluids. - Back pain is a chronic issue.  You may take the gabapentin at night.  I also sent naproxen to pharmacy and baclofen.  Follow-up with PCP or orthopedics as soon as possible for evaluation since this is a chronic and ongoing issue.  The most common types of vaginal infections are yeast infections and bacterial vaginosis. Neither of which are really considered to be sexually transmitted. Often a pH swab or wet prep is performed and if abnormal may reveal either type of infection. Begin metronidazole if prescribed for possible BV infection. If there is concern for yeast infection, fluconazole is often prescribed . Take this as directed. You may also apply topical miconazole (can be purchased OTC) externally for relief of itching. Increase rest and fluid intake. If labs sent out, we will call within 2-5 days with results and amend treatment if necessary. Always try to use pH balanced washes/wipes, urinate after intercourse, stay hydrated, and take probiotics if you are prone to vaginal infections. Return or see PCP or gynecologist for new/worsening infections.    BACK PAIN: Stressed avoiding painful activities . RICE (REST, ICE, COMPRESSION, ELEVATION) guidelines reviewed. May alternate ice and heat. Consider use of muscle rubs, Salonpas patches, etc. Use medications as directed including muscle relaxers if prescribed. Take anti-inflammatory medications as prescribed or OTC NSAIDs/Tylenol.  F/u with PCP in 7-10 days for reexamination, and please feel free to call or return to the urgent care at any time for any questions or concerns you may have and we will be happy to help you!   BACK PAIN RED FLAGS: If the back pain acutely worsens or there are any red flag symptoms such as numbness/tingling, leg weakness, saddle anesthesia, or loss of  bowel/bladder control, go immediately to the ER. Follow up with [place] as scheduled or sooner if the pain does not begin to resolve or if it worsens before the follow up    You have a condition requiring you to follow up with Orthopedics so please call one of the following office for appointment:   [facility] [address] [place], [place] [address] Phone: [phone]  There is also a [facility] in [place].  [facility] [address], [place], [place] [address] Phone: [phone]

## 2023-06-04 ENCOUNTER — Encounter: Payer: Self-pay | Admitting: *Deleted

## 2023-06-04 ENCOUNTER — Ambulatory Visit
Admission: EM | Admit: 2023-06-04 | Discharge: 2023-06-04 | Disposition: A | Discharge status: Home / Self Care | Payer: 59 | Attending: Emergency Medicine | Admitting: Emergency Medicine

## 2023-06-04 DIAGNOSIS — M5441 Lumbago with sciatica, right side: Secondary | ICD-10-CM

## 2023-06-04 DIAGNOSIS — R519 Headache, unspecified: Secondary | ICD-10-CM

## 2023-06-04 LAB — GLUCOSE, CAPILLARY: Glucose-Capillary: 187 mg/dL — ABNORMAL HIGH (ref 70–99)

## 2023-06-04 MED ORDER — KETOROLAC TROMETHAMINE 30 MG/ML IJ SOLN
30.0000 mg | Freq: Once | INTRAMUSCULAR | Status: AC
  Administered 2023-06-04: 30 mg via INTRAMUSCULAR

## 2023-06-04 NOTE — Discharge Instructions (Addendum)
Rest, push fluids, take home meds, follow-up with your PCP.  Avoid lifting turning twisting is able make your back and leg worse.  Go to ER for new or worsening issues.

## 2023-06-04 NOTE — ED Provider Notes (Addendum)
[facility]    CSN: [id number] Arrival date & time: 06/04/23  2130      History   Chief Complaint Chief Complaint  Patient presents with   Back Pain   Headache    HPI Ariana Jacobs is a 57 y.o. female.   57 y.o. female patient, Ariana Jacobs, with a past medical history of HTN, HLD, and T2DM presenting to the Urgent Care with a frontal headache since last night. Also c/o neck pain and right leg pain/numbness.    The history is provided by the patient. No language interpreter was used.    Past Medical History:  Diagnosis Date   Diabetes mellitus without complication (HCC)    Hypertension     Patient Active Problem List   Diagnosis Date Noted   Acute nonintractable headache 06/04/2023   Acute right-sided low back pain with right-sided sciatica 06/04/2023    Past Surgical History:  Procedure Laterality Date   CESAREAN SECTION     FOOT SURGERY      OB History   No obstetric history on file.      Home Medications    Prior to Admission medications   Medication Sig Start Date End Date Taking? Authorizing Provider  amLODipine (NORVASC) 5 MG tablet  01/22/19   [provider]  atorvastatin (LIPITOR) 40 MG tablet Take 40 mg by mouth daily as needed. 08/16/21   [provider]  baclofen (LIORESAL) 10 MG tablet Take 1 tablet (10 mg total) by mouth 3 (three) times daily as needed for muscle spasms. 03/02/23   Shirlee Latch, PA-C  Butenafine HCl (LOTRIMIN ULTRA) 1 % cream Apply on both feet bid x 2-4 weeks 05/30/22   Rodriguez-Southworth, Nettie Elm, PA-C  fluconazole (DIFLUCAN) 150 MG tablet Take 1 tablet (150 mg total) by mouth every 3 (three) days. 03/02/23   Eusebio Friendly B, PA-C  Gabapentin, Once-Daily, 300 MG TABS Take 1 tablet (300 mg total) by mouth at bedtime. 03/02/23 04/01/23  Eusebio Friendly B, PA-C  ipratropium (ATROVENT) 0.06 % nasal spray Place 2 sprays into both nostrils 4 (four) times daily. 01/05/23   Shirlee Latch, PA-C   JARDIANCE 10 MG TABS tablet Take 10 mg by mouth daily. 07/12/22   [provider]  LEVEMIR FLEXTOUCH 100 UNIT/ML Pen  02/12/19   [provider]  lisinopril-hydrochlorothiazide (ZESTORETIC) 20-25 MG tablet  01/08/19   [provider]  metFORMIN (GLUCOPHAGE) 1000 MG tablet 2 (two) times daily with a meal.  02/04/19   [provider]  naproxen (NAPROSYN) 375 MG tablet Take 1 tablet (375 mg total) by mouth 2 (two) times daily as needed for moderate pain. 03/02/23   Eusebio Friendly B, PA-C  GLIPIZIDE PO Take by mouth daily.  09/20/19  [provider]  HYDRALAZINE-HCTZ PO Take by mouth daily.  04/07/20  [provider]  hydrochlorothiazide (HYDRODIURIL) 25 MG tablet  01/01/19 09/20/19  [provider]  LISINOPRIL PO Take by mouth daily.  09/20/19  [provider]    Family History Family History  Problem Relation Age of Onset   Healthy Mother    Diabetes Father    Breast cancer Neg Hx     Social History Social History   Tobacco Use   Smoking status: Never   Smokeless tobacco: Never  Vaping Use   Vaping status: Never Used  Substance Use Topics   Alcohol use: Yes    Comment: Occasionally   Drug use: No     Allergies  Patient has no known allergies.   Review of Systems Review of Systems  Constitutional:  Negative for fever.  Musculoskeletal:  Positive for back pain and myalgias.  Skin: Negative.   Neurological:  Positive for headaches.  All other systems reviewed and are negative.    Physical Exam Triage Vital Signs ED Triage Vitals  Encounter Vitals Group     BP      Systolic BP Percentile      Diastolic BP Percentile      Pulse      Resp      Temp      Temp src      SpO2      Weight      Height      Head Circumference      Peak Flow      Pain Score      Pain Loc      Pain Education      Exclude from Growth Chart    No data found.  Updated Vital Signs BP (!) 153/87 (BP Location: Right Arm)    Pulse 90   Temp 98.7 F (37.1 C) (Oral)   Ht 4\' 11"  (1.499 m)   Wt 110 lb (49.9 kg)   SpO2 95%   BMI 22.22 kg/m   Visual Acuity Right Eye Distance:   Left Eye Distance:   Bilateral Distance:    Right Eye Near:   Left Eye Near:    Bilateral Near:     Physical Exam Vitals and nursing note reviewed.  Constitutional:      General: She is not in acute distress.    Appearance: She is well-developed.  HENT:     Head: Normocephalic and atraumatic.  Eyes:     General: Lids are normal. Vision grossly intact.     Extraocular Movements: Extraocular movements intact.     Conjunctiva/sclera: Conjunctivae normal.     Pupils: Pupils are equal, round, and reactive to light.  Cardiovascular:     Rate and Rhythm: Normal rate and regular rhythm.     Heart sounds: Normal heart sounds. No murmur heard. Pulmonary:     Effort: Pulmonary effort is normal. No respiratory distress.     Breath sounds: Normal breath sounds and air entry.  Abdominal:     Palpations: Abdomen is soft.     Tenderness: There is no abdominal tenderness.  Musculoskeletal:        General: No swelling.     Cervical back: Neck supple.     Lumbar back: Tenderness present. Normal range of motion. Positive right straight leg raise test. Negative left straight leg raise test.  Skin:    General: Skin is warm and dry.     Capillary Refill: Capillary refill takes less than 2 seconds.  Neurological:     General: No focal deficit present.     Mental Status: She is alert and oriented to person, place, and time.     GCS: GCS eye subscore is 4. GCS verbal subscore is 5. GCS motor subscore is 6.     Cranial Nerves: No cranial nerve deficit.     Sensory: No sensory deficit.  Psychiatric:        Attention and Perception: Attention normal.        Mood and Affect: Mood normal.        Speech: Speech normal.        Behavior: Behavior normal.      UC Treatments / Results  Labs (all  labs ordered are listed, but only abnormal  results are displayed) Labs Reviewed  GLUCOSE, CAPILLARY - Abnormal; Notable for the following components:      Result Value   Glucose-Capillary 187 (*)    All other components within normal limits  CBG MONITORING, ED    EKG   Radiology No results found.  Procedures Procedures (including critical care time)  Medications Ordered in UC Medications  ketorolac (TORADOL) 30 MG/ML injection 30 mg (has no administration in time range)    Initial Impression / Assessment and Plan / UC Course  I have reviewed the triage vital signs and the nursing notes.  Pertinent labs & imaging results that were available during my care of the patient were reviewed by me and considered in my medical decision making (see chart for details).  Clinical Course as of 06/04/23 0947  Wynelle Link Jun 04, 2023  1610 Bs is 187 after eating pancake for breakfast [JD]  0928 Toradol 30 mg IM for headache/leg pain.  [JD]    Clinical Course User Index [JD] Ark Agrusa, Para March, NP    Ddx: Headache, low back pain w sciaitica Final Clinical Impressions(s) / UC Diagnoses   Final diagnoses:  Acute nonintractable headache, unspecified headache type  Acute right-sided low back pain with right-sided sciatica     Discharge Instructions      Rest, push fluids, take home meds, follow-up with your PCP.  Avoid lifting turning twisting is able make your back and leg worse.  Go to ER for new or worsening issues.     ED Prescriptions   None    PDMP not reviewed this encounter.   Clancy Gourd, NP 06/04/23 9604    Clancy Gourd, NP 06/04/23 [phone]

## 2023-06-04 NOTE — ED Triage Notes (Signed)
Patient states frontal headache since last night, also neck pain and pain/numbness down right leg

## 2023-06-05 ENCOUNTER — Emergency Department: Payer: 59

## 2023-06-05 ENCOUNTER — Other Ambulatory Visit: Payer: Self-pay

## 2023-06-05 ENCOUNTER — Inpatient Hospital Stay
Admission: EM | Admit: 2023-06-05 | Discharge: 2023-06-09 | DRG: 065 | Disposition: A | Discharge status: Home Health | Payer: 59 | Attending: Internal Medicine | Admitting: Internal Medicine

## 2023-06-05 ENCOUNTER — Encounter: Payer: Self-pay | Admitting: Emergency Medicine

## 2023-06-05 DIAGNOSIS — Z794 Long term (current) use of insulin: Secondary | ICD-10-CM | POA: Diagnosis not present

## 2023-06-05 DIAGNOSIS — Z7984 Long term (current) use of oral hypoglycemic drugs: Secondary | ICD-10-CM

## 2023-06-05 DIAGNOSIS — I161 Hypertensive emergency: Secondary | ICD-10-CM | POA: Diagnosis present

## 2023-06-05 DIAGNOSIS — R29700 NIHSS score 0: Secondary | ICD-10-CM | POA: Diagnosis present

## 2023-06-05 DIAGNOSIS — R519 Headache, unspecified: Secondary | ICD-10-CM | POA: Diagnosis present

## 2023-06-05 DIAGNOSIS — I615 Nontraumatic intracerebral hemorrhage, intraventricular: Secondary | ICD-10-CM | POA: Diagnosis present

## 2023-06-05 DIAGNOSIS — E1165 Type 2 diabetes mellitus with hyperglycemia: Secondary | ICD-10-CM | POA: Diagnosis present

## 2023-06-05 DIAGNOSIS — I10 Essential (primary) hypertension: Secondary | ICD-10-CM | POA: Diagnosis present

## 2023-06-05 DIAGNOSIS — M4802 Spinal stenosis, cervical region: Secondary | ICD-10-CM | POA: Diagnosis present

## 2023-06-05 DIAGNOSIS — I619 Nontraumatic intracerebral hemorrhage, unspecified: Secondary | ICD-10-CM | POA: Diagnosis present

## 2023-06-05 DIAGNOSIS — H538 Other visual disturbances: Secondary | ICD-10-CM | POA: Diagnosis present

## 2023-06-05 DIAGNOSIS — Z79899 Other long term (current) drug therapy: Secondary | ICD-10-CM

## 2023-06-05 DIAGNOSIS — I629 Nontraumatic intracranial hemorrhage, unspecified: Secondary | ICD-10-CM | POA: Diagnosis present

## 2023-06-05 DIAGNOSIS — Z833 Family history of diabetes mellitus: Secondary | ICD-10-CM

## 2023-06-05 DIAGNOSIS — I6389 Other cerebral infarction: Secondary | ICD-10-CM | POA: Diagnosis not present

## 2023-06-05 LAB — COMPREHENSIVE METABOLIC PANEL
ALT: 24 U/L (ref 0–44)
AST: 22 U/L (ref 15–41)
Albumin: 4.1 g/dL (ref 3.5–5.0)
Alkaline Phosphatase: 86 U/L (ref 38–126)
Anion gap: 11 (ref 5–15)
BUN: 13 mg/dL (ref 6–20)
CO2: 25 mmol/L (ref 22–32)
Calcium: 9.6 mg/dL (ref 8.9–10.3)
Chloride: 102 mmol/L (ref 98–111)
Creatinine, Ser: 0.52 mg/dL (ref 0.44–1.00)
GFR, Estimated: >60 mL/min (ref >60)
Glucose, Bld: 224 mg/dL — ABNORMAL HIGH (ref 70–99)
Potassium: 3.8 mmol/L (ref 3.5–5.1)
Sodium: 138 mmol/L (ref 135–145)
Total Bilirubin: 0.6 mg/dL (ref 0.3–1.2)
Total Protein: 8.1 g/dL (ref 6.5–8.1)

## 2023-06-05 LAB — CBC
HCT: 37.9 % (ref 36.0–46.0)
Hemoglobin: 12.1 g/dL (ref 12.0–15.0)
MCH: 30 pg (ref 26.0–34.0)
MCHC: 31.9 g/dL (ref 30.0–36.0)
MCV: 93.8 fL (ref 80.0–100.0)
Platelets: 296 10*3/uL (ref 150–400)
RBC: 4.04 MIL/uL (ref 3.87–5.11)
RDW: 13.5 % (ref 11.5–15.5)
WBC: 8.5 10*3/uL (ref 4.0–10.5)
nRBC: 0 % (ref 0.0–0.2)

## 2023-06-05 LAB — GLUCOSE, CAPILLARY
Glucose-Capillary: 208 mg/dL — ABNORMAL HIGH (ref 70–99)
Glucose-Capillary: 248 mg/dL — ABNORMAL HIGH (ref 70–99)
Glucose-Capillary: 292 mg/dL — ABNORMAL HIGH (ref 70–99)

## 2023-06-05 LAB — APTT: aPTT: 26 s (ref 24–36)

## 2023-06-05 LAB — PROTIME-INR
INR: 1 (ref 0.8–1.2)
Prothrombin Time: 13.2 s (ref 11.4–15.2)

## 2023-06-05 LAB — MRSA NEXT GEN BY PCR, NASAL: MRSA by PCR Next Gen: NOT DETECTED

## 2023-06-05 MED ORDER — ATORVASTATIN CALCIUM 80 MG PO TABS
80.0000 mg | ORAL_TABLET | Freq: Every day | ORAL | Status: DC
  Administered 2023-06-05 – 2023-06-09 (×5): 80 mg via ORAL
  Filled 2023-06-05 (×2): qty 1
  Filled 2023-06-05 (×2): qty 4
  Filled 2023-06-05 (×3): qty 1
  Filled 2023-06-05 (×2): qty 4

## 2023-06-05 MED ORDER — METOCLOPRAMIDE HCL 5 MG/ML IJ SOLN
10.0000 mg | Freq: Once | INTRAMUSCULAR | Status: AC
  Administered 2023-06-05: 10 mg via INTRAVENOUS
  Filled 2023-06-05: qty 2

## 2023-06-05 MED ORDER — ORAL CARE MOUTH RINSE: 15.0000 mL | OROMUCOSAL | Status: DC | PRN

## 2023-06-05 MED ORDER — KETOROLAC TROMETHAMINE 15 MG/ML IJ SOLN
15.0000 mg | Freq: Once | INTRAMUSCULAR | Status: AC
  Administered 2023-06-05: 15 mg via INTRAVENOUS
  Filled 2023-06-05: qty 1

## 2023-06-05 MED ORDER — INSULIN ASPART 100 UNIT/ML IJ SOLN
0.0000 [IU] | Freq: Every day | INTRAMUSCULAR | Status: DC
  Administered 2023-06-05: 3 [IU] via SUBCUTANEOUS
  Filled 2023-06-05: qty 1

## 2023-06-05 MED ORDER — SENNOSIDES-DOCUSATE SODIUM 8.6-50 MG PO TABS
1.0000 | ORAL_TABLET | Freq: Two times a day (BID) | ORAL | Status: DC
  Administered 2023-06-05 – 2023-06-09 (×8): 1 via ORAL
  Filled 2023-06-05 (×8): qty 1

## 2023-06-05 MED ORDER — ACETAMINOPHEN 160 MG/5ML PO SOLN: 650.0000 mg | ORAL | Status: DC | PRN

## 2023-06-05 MED ORDER — HYDRALAZINE HCL 20 MG/ML IJ SOLN
10.0000 mg | Freq: Four times a day (QID) | INTRAMUSCULAR | Status: DC | PRN
  Administered 2023-06-06: 10 mg via INTRAVENOUS
  Filled 2023-06-05 (×2): qty 1

## 2023-06-05 MED ORDER — PANTOPRAZOLE SODIUM 40 MG IV SOLR: 40.0000 mg | Freq: Every day | INTRAVENOUS | Status: DC

## 2023-06-05 MED ORDER — NICARDIPINE HCL IN NACL 20-0.86 MG/200ML-% IV SOLN
0.0000 mg/h | INTRAVENOUS | Status: DC
  Administered 2023-06-05: 5 mg/h via INTRAVENOUS
  Administered 2023-06-05: 7.5 mg/h via INTRAVENOUS
  Filled 2023-06-05 (×3): qty 200

## 2023-06-05 MED ORDER — FAMOTIDINE 20 MG PO TABS
20.0000 mg | ORAL_TABLET | Freq: Two times a day (BID) | ORAL | Status: DC
  Administered 2023-06-05 – 2023-06-09 (×9): 20 mg via ORAL
  Filled 2023-06-05 (×9): qty 1

## 2023-06-05 MED ORDER — DEXAMETHASONE SODIUM PHOSPHATE 10 MG/ML IJ SOLN
10.0000 mg | Freq: Once | INTRAMUSCULAR | Status: AC
  Administered 2023-06-05: 10 mg via INTRAVENOUS
  Filled 2023-06-05: qty 1

## 2023-06-05 MED ORDER — SODIUM CHLORIDE 0.9 % IV BOLUS
500.0000 mL | Freq: Once | INTRAVENOUS | Status: AC
  Administered 2023-06-05: 500 mL via INTRAVENOUS

## 2023-06-05 MED ORDER — CHLORHEXIDINE GLUCONATE CLOTH 2 % EX PADS
6.0000 | MEDICATED_PAD | Freq: Every day | CUTANEOUS | Status: DC
  Administered 2023-06-05 – 2023-06-08 (×4): 6 via TOPICAL

## 2023-06-05 MED ORDER — POLYETHYLENE GLYCOL 3350 17 G PO PACK
17.0000 g | PACK | Freq: Every day | ORAL | Status: DC | PRN
  Administered 2023-06-06 – 2023-06-09 (×2): 17 g via ORAL
  Filled 2023-06-05 (×2): qty 1

## 2023-06-05 MED ORDER — MELATONIN 5 MG PO TABS
2.5000 mg | ORAL_TABLET | Freq: Every evening | ORAL | Status: DC | PRN
  Administered 2023-06-06 (×2): 2.5 mg via ORAL
  Filled 2023-06-05 (×2): qty 1

## 2023-06-05 MED ORDER — INSULIN ASPART 100 UNIT/ML IJ SOLN
0.0000 [IU] | Freq: Three times a day (TID) | INTRAMUSCULAR | Status: DC
  Administered 2023-06-05: 5 [IU] via SUBCUTANEOUS
  Filled 2023-06-05: qty 1

## 2023-06-05 MED ORDER — STROKE: EARLY STAGES OF RECOVERY BOOK
Freq: Once | Status: AC
  Administered 2023-06-06: 1

## 2023-06-05 MED ORDER — ACETAMINOPHEN 650 MG RE SUPP: 650.0000 mg | RECTAL | Status: DC | PRN

## 2023-06-05 MED ORDER — ACETAMINOPHEN 325 MG PO TABS
650.0000 mg | ORAL_TABLET | ORAL | Status: DC | PRN
  Administered 2023-06-06 – 2023-06-09 (×3): 650 mg via ORAL
  Filled 2023-06-05 (×3): qty 2

## 2023-06-05 NOTE — TOC Initial Note (Addendum)
Transition of Care [facility]) - Initial/Assessment Note    Patient Details  Name: Ariana Jacobs MRN: [id number] Date of Birth: Jul 05, 1966  Transition of Care [facility]) CM/SW Contact:    Marquita Palms, LCSW Phone Number: 06/05/2023, 2:00 PM  Clinical Narrative:                  CSW acknowledges consult for patient.         Patient Goals and CMS Choice            Expected Discharge Plan and Services                                              Prior Living Arrangements/Services                       Activities of Daily Living      Permission Sought/Granted                  Emotional Assessment              Admission diagnosis:  Hemorrhagic stroke [facility]) [I61.9] Patient Active Problem List   Diagnosis Date Noted   Hemorrhagic stroke (HCC) 06/05/2023   Acute nonintractable headache 06/04/2023   Acute right-sided low back pain with right-sided sciatica 06/04/2023   PCP:  Alba Cory, MD Pharmacy:   [facility] [address], [place] - [address] [place] [address] Phone: [phone] Fax: [phone]     Social Determinants of Health (SDOH) Social History: SDOH Screenings   Tobacco Use: Low Risk  (06/05/2023)   SDOH Interventions:     Readmission Risk Interventions     No data to display

## 2023-06-05 NOTE — ED Provider Notes (Signed)
-----------------------------------------   1:07 PM on 06/05/2023 ----------------------------------------- I have personally seen and evaluated the patient in conjunction with physician assistant Azucena Cecil.  On my evaluation patient is awake alert oriented overall well-appearing.  Patient remains hypertensive around 176 systolic.  I have reviewed the patient CT imaging of her head is concerning for a right intraventricular hemorrhage. Radiology has called to confirm right caudate hemorrhage with intraventricular extension.  Patient continues to have mild to moderate headache.  We will start the patient on a nicardipine infusion.  We will discuss with neurology for local admission versus transfer.  Neurology has seen and evaluated the patient.  Intensivist is seen and evaluated the patient.  We will admit to the ICU.   CRITICAL CARE Performed by: Minna Antis   Total critical care time: 30 minutes  Critical care time was exclusive of separately billable procedures and treating other patients.  Critical care was necessary to treat or prevent imminent or life-threatening deterioration.  Critical care was time spent personally by me on the following activities: development of treatment plan with patient and/or surrogate as well as nursing, discussions with consultants, evaluation of patient's response to treatment, examination of patient, obtaining history from patient or surrogate, ordering and performing treatments and interventions, ordering and review of laboratory studies, ordering and review of radiographic studies, pulse oximetry and re-evaluation of patient's condition.    Minna Antis, MD 06/05/23 1434

## 2023-06-05 NOTE — H&P (Signed)
CRITICAL CARE PROGRESS NOTE    Name: Ariana Jacobs MRN: [id number] DOB: October 26, 1965     LOS: 0   SUBJECTIVE FINDINGS & SIGNIFICANT EVENTS    History of Presenting Illness:  This is a pleasant 57 year old female with a history of uncontrolled hypertension, diabetes who came into the ED reporting worsening weakness over the last couple of months and progressively worse over the last week.  She also reports headache that started to get worse over the last few days.  She reports noting lower extremity weakness and difficulty with ambulation over the last few weeks also reporting paresthesias and numbness of lower extremities.  She was evaluated in the ED for acute CVA and was found to have hemorrhagic stroke on CT head.  Neurology had evaluated patient with recommendations for blood pressure control utilizing nicardipine as well as avoidance of any anticoagulation.  PCCM admission for acute hemorrhagic CVA with frequent neurochecks.  Lines/tubes :   Microbiology/Sepsis markers: Results for orders placed or performed during the hospital encounter of 03/02/23  Wet prep, genital     Status: Abnormal   Collection Time: 03/02/23  9:37 AM  Result Value Ref Range Status   Yeast Wet Prep HPF POC PRESENT (A) NONE SEEN Final   Trich, Wet Prep NONE SEEN NONE SEEN Final   Clue Cells Wet Prep HPF POC PRESENT (A) NONE SEEN Final   WBC, Wet Prep HPF POC <10 <10 Final   Sperm NONE SEEN  Final    Comment: Performed at [facility], [address], [place], [place] [address]    Anti-infectives:  Anti-infectives (From admission, onward)    None        Consults:   Neurology  PAST MEDICAL HISTORY   Past Medical History:  Diagnosis Date   Diabetes mellitus without complication (HCC)    Hypertension       SURGICAL HISTORY   Past Surgical History:  Procedure Laterality Date   CESAREAN SECTION     FOOT SURGERY       FAMILY HISTORY   Family History  Problem Relation Age of Onset   Healthy Mother    Diabetes Father    Breast cancer Neg Hx      SOCIAL HISTORY   Social History   Tobacco Use   Smoking status: Never   Smokeless tobacco: Never  Vaping Use   Vaping status: Never Used  Substance Use Topics   Alcohol use: Yes    Comment: Occasionally   Drug use: No     MEDICATIONS   Current Medication:  Current Facility-Administered Medications:    famotidine (PEPCID) tablet 20 mg, 20 mg, Oral, BID, Rithwik Schmieg, MD   nicardipine (CARDENE) 20mg  in 0.86% saline IV infusion (0.1 mg/ml), 0-15 mg/hr, Intravenous, Continuous, Harrison, Myah A, PA-C   polyethylene glycol (MIRALAX / GLYCOLAX) packet 17 g, 17 g, Oral, Daily PRN, Vida Rigger, MD  Current Outpatient Medications:    amLODipine (NORVASC) 5 MG tablet, , Disp: , Rfl:    atorvastatin (LIPITOR) 40 MG tablet, Take 40 mg by mouth daily as needed., Disp: , Rfl:    baclofen (LIORESAL) 10 MG tablet, Take 1 tablet (10 mg total) by mouth 3 (three) times daily as needed for muscle spasms., Disp: 30 each, Rfl: 0   Butenafine HCl (LOTRIMIN ULTRA) 1 % cream, Apply on both feet bid x 2-4 weeks, Disp: 30 g, Rfl: 0   fluconazole (DIFLUCAN) 150 MG tablet, Take 1 tablet (150 mg total)  by mouth every 3 (three) days., Disp: 2 tablet, Rfl: 0   Gabapentin, Once-Daily, 300 MG TABS, Take 1 tablet (300 mg total) by mouth at bedtime., Disp: 30 tablet, Rfl: 1   ipratropium (ATROVENT) 0.06 % nasal spray, Place 2 sprays into both nostrils 4 (four) times daily., Disp: 15 mL, Rfl: 0   JARDIANCE 10 MG TABS tablet, Take 10 mg by mouth daily., Disp: , Rfl:    LEVEMIR FLEXTOUCH 100 UNIT/ML Pen, , Disp: , Rfl:    lisinopril-hydrochlorothiazide (ZESTORETIC) 20-25 MG tablet, , Disp: , Rfl:    metFORMIN (GLUCOPHAGE) 1000 MG tablet, 2 (two)  times daily with a meal. , Disp: , Rfl:    naproxen (NAPROSYN) 375 MG tablet, Take 1 tablet (375 mg total) by mouth 2 (two) times daily as needed for moderate pain., Disp: 30 tablet, Rfl: 1    ALLERGIES   Patient has no known allergies.    REVIEW OF SYSTEMS   10 point review of systems completed and is negative except for lower extremity numbness and paresthesias in dizziness with headache  PHYSICAL EXAMINATION   Vital Signs: Temp:  [97.8 F (36.6 C)-98.6 F (37 C)] 97.8 F (36.6 C) (09/30 1300) Pulse Rate:  [88-89] 88 (09/30 1300) Resp:  [15-17] 15 (09/30 1300) BP: (156-173)/(80-92) 173/92 (09/30 1300) SpO2:  [100 %] 100 % (09/30 1300) Weight:  [49.9 kg] 49.9 kg (09/30 1057)  GENERAL: Mild distress due to acute hemorrhagic CVA HEAD: Normocephalic, atraumatic.  EYES: Pupils equal, round, reactive to light.  No scleral icterus.  MOUTH: Moist mucosal membrane.  Symmetric smile NECK: Supple. No thyromegaly. No nodules. No JVD.  PULMONARY: Huel Cote to auscultation bilaterally CARDIOVASCULAR: S1 and S2. Regular rate and rhythm. No murmurs, rubs, or gallops.  GASTROINTESTINAL: Soft, nontender, non-distended. No masses. Positive bowel sounds. No hepatosplenomegaly.  MUSCULOSKELETAL: No swelling, clubbing, or edema.  Strength 4 out of 4 in all 4 extremities NEUROLOGIC: Mild distress due to acute illness no acute FND grossly on examination SKIN:intact,warm,dry   PERTINENT DATA     Infusions:  niCARDipine     Scheduled Medications:  famotidine  20 mg Oral BID   PRN Medications: polyethylene glycol Hemodynamic parameters:   Intake/Output: No intake/output data recorded.  Ventilator  Settings:     LAB RESULTS:  Basic Metabolic Panel: Recent Labs  Lab 06/05/23 1058  NA 138  K 3.8  CL 102  CO2 25  GLUCOSE 224*  BUN 13  CREATININE 0.52  CALCIUM 9.6   Liver Function Tests: Recent Labs  Lab 06/05/23 1058  AST 22  ALT 24  ALKPHOS 86  BILITOT 0.6  PROT 8.1   ALBUMIN 4.1   No results for input(s): "LIPASE", "AMYLASE" in the last 168 hours. No results for input(s): "AMMONIA" in the last 168 hours. CBC: Recent Labs  Lab 06/05/23 1058  WBC 8.5  HGB 12.1  HCT 37.9  MCV 93.8  PLT 296   Cardiac Enzymes: No results for input(s): "CKTOTAL", "CKMB", "CKMBINDEX", "TROPONINI" in the last 168 hours. BNP: Invalid input(s): "POCBNP" CBG: Recent Labs  Lab 06/04/23 0909  GLUCAP 187*       IMAGING RESULTS:     ASSESSMENT AND PLAN    -Multidisciplinary rounds held today  Acute hemorrhagic CVA -Neurology consultation-appreciate input         Nicardipine blood pressure control parameters as per neurology -CT head reviewed with findings including -9 mm intraparenchymal hematoma in the right caudate head withintraventricular extension into the lateral, third, and fourth  ventricles but no hydrocephalus -Statin -PT OT -Status post cervical spine CT chest with degenerative changes at C4-C5 through C6-C7 with up to moderate spinal canal stenosis at CT4-C5  Uncontrolled hyperglycemia with type 2 diabetes -Weight-based long-acting and short acting insulin with sliding scale ACHS -Coordinator consultation  Uncontrolled hypertension -Currently on nicardipine drip with parameters as per neurology   GI/Nutrition GI PROPHYLAXIS as indicated DIET-->TF's as tolerated Constipation protocol as indicated  ENDO - ICU hypoglycemic\Hyperglycemia protocol -check FSBS per protocol   ELECTROLYTES -follow labs as needed -replace as needed -pharmacy consultation   DVT/GI PRX ordered -SCDs  TRANSFUSIONS AS NEEDED MONITOR FSBS ASSESS the need for LABS as needed    Critical care provider statement:   Total critical care time: 33 minutes   Performed by: Karna Christmas MD   Critical care time was exclusive of separately billable procedures and treating other patients.   Critical care was necessary to treat or prevent imminent or  life-threatening deterioration.   Critical care was time spent personally by me on the following activities: development of treatment plan with patient and/or surrogate as well as nursing, discussions with consultants, evaluation of patient's response to treatment, examination of patient, obtaining history from patient or surrogate, ordering and performing treatments and interventions, ordering and review of laboratory studies, ordering and review of radiographic studies, pulse oximetry and re-evaluation of patient's condition.    Vida Rigger, M.D.  Pulmonary & Critical Care Medicine

## 2023-06-05 NOTE — Consult Note (Signed)
Neurology Consultation Reason for Consult: Intracranial hemorrhage Referring Physician: Cory Roughen, F  CC: Intracranial hemorrhage  History is obtained from: Patient  HPI: Ariana Jacobs is a 57 y.o. female with history of hypertension and diabetes who began having nausea/vomiting and headache 3 days ago.  She denies any numbness, weakness, confusion, or other symptoms.  Due to her symptoms not improving, she sought care in the emergency department today where a CT was performed showing intracranial hemorrhage.  She states that her symptoms have been fairly static since they started, not getting worse and not getting better.  She states that her blood pressures vary from normal to high.  LKW: 3 days ago tnk given?: no, ICH Premorbid modified rankin scale: 0 ICH Score: 1 NIH stroke screening: Zero  Past Medical History:  Diagnosis Date   Diabetes mellitus without complication (HCC)    Hypertension      Family History  Problem Relation Age of Onset   Healthy Mother    Diabetes Father    Breast cancer Neg Hx      Social History:  reports that she has never smoked. She has never used smokeless tobacco. She reports current alcohol use. She reports that she does not use drugs.   Exam: Current vital signs: BP (!) 151/82   Pulse 98   Temp 97.8 F (36.6 C) (Oral)   Resp 18   Ht 4\' 11"  (1.499 m)   Wt 49.9 kg   SpO2 100%   BMI 22.22 kg/m  Vital signs in last 24 hours: Temp:  [97.8 F (36.6 C)-98.6 F (37 C)] 97.8 F (36.6 C) (09/30 1300) Pulse Rate:  [88-98] 98 (09/30 1410) Resp:  [15-26] 18 (09/30 1410) BP: (151-173)/(80-92) 151/82 (09/30 1410) SpO2:  [100 %] 100 % (09/30 1410) Weight:  [49.9 kg] 49.9 kg (09/30 1057)   Physical Exam  Appears well-developed and well-nourished.   Neuro: Mental Status: Patient is awake, alert, oriented to person, place, month, year, and situation. Patient is able to give a clear and coherent history. No signs of aphasia or  neglect Cranial Nerves: II: Visual Fields are full. Pupils are equal, round, and reactive to light.   III,IV, VI: EOMI without ptosis or diploplia.  V: Facial sensation is symmetric to temperature VII: Facial movement is symmetric.  VIII: hearing is intact to voice X: Uvula elevates symmetrically XI: Shoulder shrug is symmetric. XII: tongue is midline without atrophy or fasciculations.  Motor: Tone is normal. Bulk is normal. 5/5 strength was present in all four extremities.  Sensory: Sensation is symmetric to light touch and temperature in the arms and legs. Cerebellar: FNF intact bilaterally      I have reviewed labs in epic and the results pertinent to this consultation are: Glucose 224 Creatinine 0.5  I have reviewed the images obtained: CT head-small basal ganglia hemorrhage with extension intraventricularly  Impression: 57 year old female with likely hypertensive bleed that extended into the ventricles.  She is 3 days out, so this is a subacute hemorrhage but I would still favor treating this as hypertensive emergency.  She will need to avoid antiplatelets and anticoagulants for the time being.  Recommendations: 1) Admit to ICU 2) no antiplatelets or anticoagulants 3) blood pressure control with goal systolic 130 - 150 4) Frequent neuro checks 5) If symptoms worsen or there is decreased mental status, repeat stat head CT 6) PT,OT,ST   Ritta Slot, MD [facility] [phone]  If 7pm- 7am, please page neurology on call as listed  in AMION.

## 2023-06-05 NOTE — ED Notes (Signed)
Intensivist, Aleskerov at bedside.

## 2023-06-05 NOTE — ED Provider Notes (Signed)
[facility] Emergency Department Provider Note     Event Date/Time   First MD Initiated Contact with Patient 06/05/23 1105     (approximate)   History   Headache   HPI  Ariana Jacobs is a 57 y.o. female with a history of HTN and DM presents to the ED with complaint of sudden onset of headache that began on Saturday morning.  Quality is described as constant pressure around bilateral temporal with radiation across her head and descending down to base of neck.  Associated symptoms includes intermittent dizziness and vomiting episodes x 3.  Patient reports blurry vision bilaterally but also states she has been needing glasses for a while now and this is not new.  Patient denies any injury or falls and history of drug use.  Patient reports she was at a tea party when her headache began.  Patient does not have a history of headache and states this is the worst pain. Denies fever, recent illness and sick contacts.   Physical Exam   Triage Vital Signs: ED Triage Vitals  Encounter Vitals Group     BP 06/05/23 1057 (!) 156/80     Systolic BP Percentile --      Diastolic BP Percentile --      Pulse Rate 06/05/23 1057 89     Resp 06/05/23 1057 17     Temp 06/05/23 1057 98.6 F (37 C)     Temp Source 06/05/23 1057 Oral     SpO2 06/05/23 1057 100 %     Weight 06/05/23 1057 110 lb 0.2 oz (49.9 kg)     Height 06/05/23 1057 4\' 11"  (1.499 m)     Head Circumference --      Peak Flow --      Pain Score 06/05/23 1056 10     Pain Loc --      Pain Education --      Exclude from Growth Chart --     Most recent vital signs: Vitals:   06/05/23 1057 06/05/23 1300  BP: (!) 156/80 (!) 173/92  Pulse: 89 88  Resp: 17 15  Temp: 98.6 F (37 C) 97.8 F (36.6 C)  SpO2: 100% 100%    General: Alert and oriented. INAD. Non-toxic  Head:  NCAT.  Eyes:  PERRLA. EOMI.  Neck:   No nuchal rigidity. No cervical spine tenderness to palpation. Full ROM without difficulty.   CV:  Good peripheral perfusion. RRR.  RESP:  Normal effort. LCTAB.  ABD:  No distention.  NEURO: Cranial nerves II-XII intact. No focal deficits. Sensation and motor function intact. 5/5 muscle strength of UE & LE bilaterally.   ED Results / Procedures / Treatments   Labs (all labs ordered are listed, but only abnormal results are displayed) Labs Reviewed  COMPREHENSIVE METABOLIC PANEL - Abnormal; Notable for the following components:      Result Value   Glucose, Bld 224 (*)    All other components within normal limits  CBC  PROTIME-INR  APTT   RADIOLOGY  I personally viewed and evaluated these images as part of my medical decision making, as well as reviewing the written report by the radiologist.  ED Provider Interpretation: Noted intracranial bleed of the right ventricle.  CT Head Wo Contrast  Result Date: 06/05/2023 CLINICAL DATA:  Headache, vomiting EXAM: CT HEAD WITHOUT CONTRAST CT CERVICAL SPINE WITHOUT CONTRAST TECHNIQUE: Multidetector CT imaging of the head and cervical spine was performed following the standard protocol  without intravenous contrast. Multiplanar CT image reconstructions of the cervical spine were also generated. RADIATION DOSE REDUCTION: This exam was performed according to the departmental dose-optimization program which includes automated exposure control, adjustment of the mA and/or kV according to patient size and/or use of iterative reconstruction technique. COMPARISON:  None Available. FINDINGS: CT HEAD FINDINGS Brain: There is acute intraventricular hemorrhage in the lateral, third, and fourth ventricles there is no hydrocephalus. The intraventricular hemorrhage is likely extending from parenchymal hemorrhage in the right caudate head measuring up to approximately 9 mm (4-27). There is mild surrounding edema but no significant mass effect. No subarachnoid hemorrhage is seen. There is no acute territorial infarct Parenchymal volume is normal. Gray-white  differentiation is preserved The pituitary and suprasellar region are normal. There is no mass lesion. There is no midline shift. Vascular: No hyperdense vessel or unexpected calcification. Skull: Normal. Negative for fracture or focal lesion. Sinuses/Orbits: The imaged paranasal sinuses are clear. The imaged globes and orbits are unremarkable. Other: The mastoid air cells and middle ear cavities are clear. CT CERVICAL SPINE FINDINGS Alignment: There is slight reversal of the normal cervical curvature. There is no antero or retrolisthesis. Skull base and vertebrae: Skull base alignment is maintained. Vertebral body heights are preserved. There is no evidence of acute fracture. There is no suspicious osseous lesion. Soft tissues and spinal canal: No prevertebral fluid or swelling. No visible canal hematoma. Disc levels: There is mild disc space narrowing with associated degenerative endplate change C4-C5 through C6-C7. There are bulky anterior osteophytes at C4-C5 as well as a disc protrusion resulting in moderate spinal canal stenosis. Upper chest: The imaged lung apices are clear. Other: None. IMPRESSION: 1. 9 mm intraparenchymal hematoma in the right caudate head with intraventricular extension into the lateral, third, and fourth ventricles but no hydrocephalus. 2. No acute fracture or traumatic malalignment of the cervical spine. 3. Degenerative changes at C4-C5 through C6-C7 with up to moderate spinal canal stenosis at C4-C5. Critical Value/emergent results were called by telephone at the time of interpretation on 06/05/2023 at 12:59 pm to provider Dr Lenard Lance, who verbally acknowledged these results. Electronically Signed   By: Lesia Hausen M.D.   On: 06/05/2023 13:01   CT CERVICAL SPINE WO CONTRAST  Result Date: 06/05/2023 CLINICAL DATA:  Headache, vomiting EXAM: CT HEAD WITHOUT CONTRAST CT CERVICAL SPINE WITHOUT CONTRAST TECHNIQUE: Multidetector CT imaging of the head and cervical spine was performed  following the standard protocol without intravenous contrast. Multiplanar CT image reconstructions of the cervical spine were also generated. RADIATION DOSE REDUCTION: This exam was performed according to the departmental dose-optimization program which includes automated exposure control, adjustment of the mA and/or kV according to patient size and/or use of iterative reconstruction technique. COMPARISON:  None Available. FINDINGS: CT HEAD FINDINGS Brain: There is acute intraventricular hemorrhage in the lateral, third, and fourth ventricles there is no hydrocephalus. The intraventricular hemorrhage is likely extending from parenchymal hemorrhage in the right caudate head measuring up to approximately 9 mm (4-27). There is mild surrounding edema but no significant mass effect. No subarachnoid hemorrhage is seen. There is no acute territorial infarct Parenchymal volume is normal. Gray-white differentiation is preserved The pituitary and suprasellar region are normal. There is no mass lesion. There is no midline shift. Vascular: No hyperdense vessel or unexpected calcification. Skull: Normal. Negative for fracture or focal lesion. Sinuses/Orbits: The imaged paranasal sinuses are clear. The imaged globes and orbits are unremarkable. Other: The mastoid air cells and middle ear cavities are  clear. CT CERVICAL SPINE FINDINGS Alignment: There is slight reversal of the normal cervical curvature. There is no antero or retrolisthesis. Skull base and vertebrae: Skull base alignment is maintained. Vertebral body heights are preserved. There is no evidence of acute fracture. There is no suspicious osseous lesion. Soft tissues and spinal canal: No prevertebral fluid or swelling. No visible canal hematoma. Disc levels: There is mild disc space narrowing with associated degenerative endplate change C4-C5 through C6-C7. There are bulky anterior osteophytes at C4-C5 as well as a disc protrusion resulting in moderate spinal canal  stenosis. Upper chest: The imaged lung apices are clear. Other: None. IMPRESSION: 1. 9 mm intraparenchymal hematoma in the right caudate head with intraventricular extension into the lateral, third, and fourth ventricles but no hydrocephalus. 2. No acute fracture or traumatic malalignment of the cervical spine. 3. Degenerative changes at C4-C5 through C6-C7 with up to moderate spinal canal stenosis at C4-C5. Critical Value/emergent results were called by telephone at the time of interpretation on 06/05/2023 at 12:59 pm to provider Dr Lenard Lance, who verbally acknowledged these results. Electronically Signed   By: Lesia Hausen M.D.   On: 06/05/2023 13:01    PROCEDURES:  Critical Care performed: No  Procedures   MEDICATIONS ORDERED IN ED: Medications  nicardipine (CARDENE) 20mg  in 0.86% saline IV infusion (0.1 mg/ml) (has no administration in time range)  metoCLOPramide (REGLAN) injection 10 mg (10 mg Intravenous Given 06/05/23 1147)  dexamethasone (DECADRON) injection 10 mg (10 mg Intravenous Given 06/05/23 1147)  ketorolac (TORADOL) 15 MG/ML injection 15 mg (15 mg Intravenous Given 06/05/23 1147)  sodium chloride 0.9 % bolus 500 mL (0 mLs Intravenous Stopped 06/05/23 1243)     IMPRESSION / MDM / ASSESSMENT AND PLAN / ED COURSE  I reviewed the triage vital signs and the nursing notes.                              Clinical Course as of 06/05/23 1445  Mon Jun 05, 2023  1444 Daughter informed of patients admission to ICU per patient request. Daughter is primary emergency contact Terrall Laity [MH]    Clinical Course User Index [MH] Kern Reap A, PA-C    57 y.o. female presents to the emergency department for evaluation and treatment of acute onset headache. See HPI for further details. Vital signs and physical exam are pertinent for elevated blood pressure of 156/80.   Differential diagnosis includes, but is not limited to Intracranial hemorrhage, intracranial mass,  subarachnoid hemorrhage, tension headache  Patient's presentation is most consistent with acute presentation with potential threat to life or bodily function.  The patient is on the cardiac monitor to evaluate for evidence of arrhythmia and/or significant heart rate changes.  Basic labs ordered in triage.  Overall reassuring with the exception of an elevated glucose of 224.  Given symptoms and history CT head obtained resulting in a 9 mm intraparenchymal hematoma in the right caudate head with intra ventricular extension into surrounding regions.  BP rechecked and increased to 173/92.  On further discussion with patient she admits to not taking her hypertension medication today due to her pain.  Patient will be placed on nicardipine drip in the ED.  A PTT and pro time INR ordered.  Neurology consulted Dr. Amada Jupiter of neurology recommended ICU admission with further instructions to hold antithrombotics and BP control.  He will assess the patient in the ED.  ICU Intensivist Dr. Karna Christmas  will assume care on ICU floor.   Patient is transferred to main side of ED prior to transfer to ICU.  Daughter was called. No answer.   FINAL CLINICAL IMPRESSION(S) / ED DIAGNOSES   Final diagnoses:  Intracranial hemorrhage (HCC)   Rx / DC Orders   ED Discharge Orders     None      Note:  This document was prepared using Dragon voice recognition software and may include unintentional dictation errors.    Romeo Apple, Airrion Otting A, PA-C 06/05/23 1338    Minna Antis, MD 06/05/23 1433

## 2023-06-05 NOTE — ED Triage Notes (Signed)
Pt here with a headache since yesterday. Pt states she does not have a hx of migraines. Pt states she vomiting 3 times this morning but denies fevers.

## 2023-06-05 NOTE — Plan of Care (Signed)
  Problem: Education: Goal: Knowledge of disease or condition will improve Outcome: Progressing Goal: Knowledge of secondary prevention will improve (MUST DOCUMENT ALL) Outcome: Progressing Goal: Knowledge of patient specific risk factors will improve Loraine Leriche N/A or DELETE if not current risk factor) Outcome: Progressing   Problem: Intracerebral Hemorrhage Tissue Perfusion: Goal: Complications of Intracerebral Hemorrhage will be minimized Outcome: Progressing   Problem: Coping: Goal: Will identify appropriate support needs Outcome: Progressing   Problem: Health Behavior/Discharge Planning: Goal: Ability to manage health-related needs will improve Outcome: Progressing Goal: Goals will be collaboratively established with patient/family Outcome: Progressing   Problem: Self-Care: Goal: Ability to participate in self-care as condition permits will improve Outcome: Progressing   Problem: Nutrition: Goal: Risk of aspiration will decrease Outcome: Progressing Goal: Dietary intake will improve Outcome: Progressing   Problem: Education: Goal: Ability to describe self-care measures that may prevent or decrease complications (Diabetes Survival Skills Education) will improve Outcome: Progressing   Problem: Nutritional: Goal: Maintenance of adequate nutrition will improve Outcome: Progressing

## 2023-06-05 NOTE — Consult Note (Signed)
PHARMACY CONSULT NOTE - FOLLOW UP  Pharmacy Consult for Electrolyte Monitoring and Replacement   Recent Labs: Potassium (mmol/L)  Date Value  06/05/2023 3.8  02/26/2013 3.5   Calcium (mg/dL)  Date Value  40/98/1191 9.6   Calcium, Total (mg/dL)  Date Value  47/82/9562 8.7   Albumin (g/dL)  Date Value  13/04/6577 4.1  02/26/2013 3.5   Sodium (mmol/L)  Date Value  06/05/2023 138  02/26/2013 136     Assessment: 57 year old female with a history of uncontrolled hypertension, diabetes who came into the ED reporting worsening weakness over the last couple of months and progressively worse over the last week. Pt has headaches and started nicardipine for BP control.   Goal of Therapy:  WNL  Plan:  No replacement needed at this time.  F/u with AM labs.   Ronnald Ramp ,PharmD Clinical Pharmacist 06/05/2023 2:06 PM

## 2023-06-06 ENCOUNTER — Inpatient Hospital Stay: Payer: 59

## 2023-06-06 DIAGNOSIS — I619 Nontraumatic intracerebral hemorrhage, unspecified: Secondary | ICD-10-CM | POA: Diagnosis not present

## 2023-06-06 LAB — CBC
HCT: 36.3 % (ref 36.0–46.0)
Hemoglobin: 12 g/dL (ref 12.0–15.0)
MCH: 29.9 pg (ref 26.0–34.0)
MCHC: 33.1 g/dL (ref 30.0–36.0)
MCV: 90.5 fL (ref 80.0–100.0)
Platelets: 300 10*3/uL (ref 150–400)
RBC: 4.01 MIL/uL (ref 3.87–5.11)
RDW: 13.6 % (ref 11.5–15.5)
WBC: 7.1 10*3/uL (ref 4.0–10.5)
nRBC: 0 % (ref 0.0–0.2)

## 2023-06-06 LAB — HIV ANTIBODY (ROUTINE TESTING W REFLEX): HIV Screen 4th Generation wRfx: NONREACTIVE

## 2023-06-06 LAB — BASIC METABOLIC PANEL
Anion gap: 11 (ref 5–15)
BUN: 21 mg/dL — ABNORMAL HIGH (ref 6–20)
CO2: 23 mmol/L (ref 22–32)
Calcium: 9.5 mg/dL (ref 8.9–10.3)
Chloride: 103 mmol/L (ref 98–111)
Creatinine, Ser: 0.56 mg/dL (ref 0.44–1.00)
GFR, Estimated: >60 mL/min (ref >60)
Glucose, Bld: 229 mg/dL — ABNORMAL HIGH (ref 70–99)
Potassium: 3.7 mmol/L (ref 3.5–5.1)
Sodium: 137 mmol/L (ref 135–145)

## 2023-06-06 LAB — PHOSPHORUS: Phosphorus: 3.3 mg/dL (ref 2.5–4.6)

## 2023-06-06 LAB — HEMOGLOBIN A1C
Hgb A1c MFr Bld: 9.8 % — ABNORMAL HIGH (ref 4.8–5.6)
Mean Plasma Glucose: 234.56 mg/dL

## 2023-06-06 LAB — GLUCOSE, CAPILLARY
Glucose-Capillary: 259 mg/dL — ABNORMAL HIGH (ref 70–99)
Glucose-Capillary: 168 mg/dL — ABNORMAL HIGH (ref 70–99)
Glucose-Capillary: 188 mg/dL — ABNORMAL HIGH (ref 70–99)
Glucose-Capillary: 200 mg/dL — ABNORMAL HIGH (ref 70–99)
Glucose-Capillary: 178 mg/dL — ABNORMAL HIGH (ref 70–99)
Glucose-Capillary: 187 mg/dL — ABNORMAL HIGH (ref 70–99)

## 2023-06-06 LAB — MAGNESIUM: Magnesium: 2.1 mg/dL (ref 1.7–2.4)

## 2023-06-06 MED ORDER — MORPHINE SULFATE (PF) 2 MG/ML IV SOLN
2.0000 mg | INTRAVENOUS | Status: DC | PRN
  Administered 2023-06-06 – 2023-06-08 (×3): 2 mg via INTRAVENOUS
  Filled 2023-06-06 (×3): qty 1

## 2023-06-06 MED ORDER — LABETALOL HCL 5 MG/ML IV SOLN
10.0000 mg | INTRAVENOUS | Status: DC | PRN
  Administered 2023-06-06 – 2023-06-08 (×4): 10 mg via INTRAVENOUS
  Filled 2023-06-06 (×4): qty 4

## 2023-06-06 MED ORDER — HYDROCODONE-ACETAMINOPHEN 5-325 MG PO TABS
1.0000 | ORAL_TABLET | Freq: Four times a day (QID) | ORAL | Status: DC | PRN
  Administered 2023-06-07 (×3): 2 via ORAL
  Administered 2023-06-08: 1 via ORAL
  Administered 2023-06-08: 2 via ORAL
  Administered 2023-06-08: 1 via ORAL
  Filled 2023-06-06: qty 2
  Filled 2023-06-06: qty 1
  Filled 2023-06-06 (×3): qty 2
  Filled 2023-06-06: qty 1

## 2023-06-06 MED ORDER — INSULIN ASPART 100 UNIT/ML IJ SOLN
0.0000 [IU] | Freq: Three times a day (TID) | INTRAMUSCULAR | Status: DC
  Administered 2023-06-06 – 2023-06-07 (×5): 4 [IU] via SUBCUTANEOUS
  Administered 2023-06-07: 7 [IU] via SUBCUTANEOUS
  Filled 2023-06-06 (×6): qty 1

## 2023-06-06 MED ORDER — INSULIN ASPART 100 UNIT/ML IJ SOLN
5.0000 [IU] | Freq: Once | INTRAMUSCULAR | Status: AC
  Administered 2023-06-06: 5 [IU] via SUBCUTANEOUS
  Filled 2023-06-06: qty 1

## 2023-06-06 MED ORDER — SODIUM CHLORIDE 0.9 % IV SOLN
25.0000 mg | Freq: Once | INTRAVENOUS | Status: AC
  Administered 2023-06-06: 25 mg via INTRAVENOUS
  Filled 2023-06-06: qty 1

## 2023-06-06 MED ORDER — LABETALOL HCL 5 MG/ML IV SOLN
10.0000 mg | INTRAVENOUS | Status: DC | PRN
  Administered 2023-06-06: 10 mg via INTRAVENOUS
  Filled 2023-06-06: qty 4

## 2023-06-06 MED ORDER — LABETALOL HCL 5 MG/ML IV SOLN: 10.0000 mg | INTRAVENOUS | Status: DC | PRN

## 2023-06-06 MED ORDER — LISINOPRIL 20 MG PO TABS
20.0000 mg | ORAL_TABLET | Freq: Every day | ORAL | Status: DC
  Administered 2023-06-06 – 2023-06-09 (×4): 20 mg via ORAL
  Filled 2023-06-06 (×4): qty 1

## 2023-06-06 MED ORDER — HYDROCHLOROTHIAZIDE 25 MG PO TABS
25.0000 mg | ORAL_TABLET | Freq: Every day | ORAL | Status: DC
  Administered 2023-06-06 – 2023-06-09 (×4): 25 mg via ORAL
  Filled 2023-06-06 (×4): qty 1

## 2023-06-06 MED ORDER — ONDANSETRON HCL 4 MG/2ML IJ SOLN
4.0000 mg | Freq: Four times a day (QID) | INTRAMUSCULAR | Status: DC | PRN
  Administered 2023-06-06 – 2023-06-08 (×4): 4 mg via INTRAVENOUS
  Filled 2023-06-06 (×4): qty 2

## 2023-06-06 MED ORDER — AMLODIPINE BESYLATE 5 MG PO TABS
5.0000 mg | ORAL_TABLET | Freq: Every day | ORAL | Status: DC
  Administered 2023-06-06 – 2023-06-08 (×3): 5 mg via ORAL
  Filled 2023-06-06 (×3): qty 1

## 2023-06-06 NOTE — Progress Notes (Signed)
Subjective: Got some pain medicines shortly before my evaluation  Exam: Vitals:   06/06/23 1030 06/06/23 1100  BP: 128/66 (!) 143/71  Pulse: 90 89  Resp: 18 14  Temp:    SpO2: 99% 100%   Gen: In bed, NAD Resp: non-labored breathing, no acute distress Abd: soft, nt  Neuro: MS: Sleepy, but arouses easily, alert able to follow commands well, completely oriented CN: Visual fields are full, EOMI Motor: 5/5 throughout Sensory: Intact to light touch  Pertinent Labs: Glucose 229  Impression: 57 year old female with likely hypertensive hemorrhage.  She will need continued blood pressure management as her primary means of secondary prevention.  Recommendations: 1) continue BP control, would continue using PRNs for SBP > 160 2) neurology will follow  Ritta Slot, MD [facility] [phone]  If 7pm- 7am, please page neurology on call as listed in AMION.

## 2023-06-06 NOTE — Consult Note (Signed)
PHARMACY CONSULT NOTE - FOLLOW UP  Pharmacy Consult for Electrolyte Monitoring and Replacement   Recent Labs: Potassium (mmol/L)  Date Value  06/06/2023 3.7  02/26/2013 3.5   Magnesium (mg/dL)  Date Value  44/09/270 2.1   Calcium (mg/dL)  Date Value  53/66/4403 9.5   Calcium, Total (mg/dL)  Date Value  47/42/5956 8.7   Albumin (g/dL)  Date Value  38/75/6433 4.1  02/26/2013 3.5   Phosphorus (mg/dL)  Date Value  29/51/8841 3.3   Sodium (mmol/L)  Date Value  06/06/2023 137  02/26/2013 136     Assessment: 57 year old female with a history of uncontrolled hypertension, diabetes who came into the ED reporting worsening weakness over the last couple of months and progressively worse over the last week. Pt has headaches and started nicardipine for BP control.   Goal of Therapy:  WNL  Plan:  No replacement needed at this time.  F/u with AM labs.   Bettey Costa ,PharmD Clinical Pharmacist 06/06/2023 12:22 PM

## 2023-06-06 NOTE — Progress Notes (Signed)
PT Cancellation Note  Patient Details Name: Ariana Jacobs MRN: [id number] DOB: Apr 01, 1966   Cancelled Treatment:    Reason Eval/Treat Not Completed: Patient not medically ready. Discussed with care team, will hold off on exertional PT this date. Will re-attempt next date.   Huntleigh Doolen 06/06/2023, 10:11 AM Elizabeth Palau, PT, DPT, GCS [phone]

## 2023-06-06 NOTE — Progress Notes (Addendum)
1610- Patient hypertensive despite PRN hydralazine administered on nightshift. BP 160/83. Pt complaint of severe 10/10 headache, nausea, and vomiting.  0725- Harlon Ditty, NP to bedside. Orders received for STAT head CT, PRN labetalol and Zofran.   0730- PRNs administered and pt transported to/from CT without complication.

## 2023-06-06 NOTE — Plan of Care (Signed)
  Problem: Education: Goal: Knowledge of disease or condition will improve Outcome: Progressing Goal: Knowledge of secondary prevention will improve (MUST DOCUMENT ALL) Outcome: Progressing Goal: Knowledge of patient specific risk factors will improve Loraine Leriche N/A or DELETE if not current risk factor) Outcome: Progressing   Problem: Intracerebral Hemorrhage Tissue Perfusion: Goal: Complications of Intracerebral Hemorrhage will be minimized Outcome: Progressing   Problem: Coping: Goal: Will verbalize positive feelings about self Outcome: Progressing Goal: Will identify appropriate support needs Outcome: Progressing   Problem: Health Behavior/Discharge Planning: Goal: Ability to manage health-related needs will improve Outcome: Progressing Goal: Goals will be collaboratively established with patient/family Outcome: Progressing   Problem: Self-Care: Goal: Ability to participate in self-care as condition permits will improve Outcome: Progressing   Problem: Nutrition: Goal: Dietary intake will improve Outcome: Not Progressing   Problem: Fluid Volume: Goal: Ability to maintain a balanced intake and output will improve Outcome: Not Progressing   Problem: Self-Care: Goal: Verbalization of feelings and concerns over difficulty with self-care will improve Outcome: Not Applicable Goal: Ability to communicate needs accurately will improve Outcome: Not Applicable   Problem: Nutrition: Goal: Risk of aspiration will decrease Outcome: Not Applicable

## 2023-06-06 NOTE — Progress Notes (Signed)
OT Cancellation Note  Patient Details Name: Ariana Jacobs MRN: [id number] DOB: September 01, 1966   Cancelled Treatment:    Reason Eval/Treat Not Completed: Patient not medically ready. Per care team, will hold off on exertional PT this date. Will re-attempt next date.   Arman Filter., MPH, MS, OTR/L ascom [phone] 06/06/23, 12:52 PM

## 2023-06-06 NOTE — Progress Notes (Signed)
SLP Cancellation Note  Patient Details Name: Ariana Jacobs MRN: [id number] DOB: 13-May-1966   Cancelled treatment:       Reason Eval/Treat Not Completed: SLP screened, no needs identified, will sign off (Chart reviewed. Consulted with RN. No current speech/language/cognitive needs.)  Clyde Canterbury, M.S., CCC-SLP Speech-Language Pathologist [facility] [phone] Arnette Felts)  Woodroe Chen 06/06/2023, 10:32 AM

## 2023-06-06 NOTE — Progress Notes (Signed)
CRITICAL CARE PROGRESS NOTE    Name: Ariana Jacobs MRN: [id number] DOB: 07-10-1966     LOS: 1   SUBJECTIVE FINDINGS & SIGNIFICANT EVENTS    History of Presenting Illness:  This is a pleasant 57 year old female with a history of uncontrolled hypertension, diabetes who came into the ED reporting worsening weakness over the last couple of months and progressively worse over the last week.  She also reports headache that started to get worse over the last few days.  She reports noting lower extremity weakness and difficulty with ambulation over the last few weeks also reporting paresthesias and numbness of lower extremities.  She was evaluated in the ED for acute CVA and was found to have hemorrhagic stroke on CT head.  Neurology had evaluated patient with recommendations for blood pressure control utilizing nicardipine as well as avoidance of any anticoagulation.  PCCM admission for acute hemorrhagic CVA with frequent neurochecks.  06/06/23- patient overnight with emesis and headache, no neurologic changes. She had repeat neuroimaging with no acute changes. Vital are normal.  Emesis resolved post Zofran. She is being optimzed for downgrade to Step down unit. Will sign out to [facility].   Lines/tubes :   Microbiology/Sepsis markers: Results for orders placed or performed during the hospital encounter of 06/05/23  MRSA Next Gen by PCR, Nasal     Status: None   Collection Time: 06/05/23  2:34 PM   Specimen: Nasal Mucosa; Nasal Swab  Result Value Ref Range Status   MRSA by PCR Next Gen NOT DETECTED NOT DETECTED Final    Comment: (NOTE) The GeneXpert MRSA Assay (FDA approved for NASAL specimens only), is one component of a comprehensive MRSA colonization surveillance program. It is not intended to diagnose MRSA infection nor  to guide or monitor treatment for MRSA infections. Test performance is not FDA approved in patients less than 26 years old. Performed at [facility], [address], [place], [place] [address]     Anti-infectives:  Anti-infectives (From admission, onward)    None        Consults:   Neurology  PAST MEDICAL HISTORY   Past Medical History:  Diagnosis Date   Diabetes mellitus without complication (HCC)    Hypertension      SURGICAL HISTORY   Past Surgical History:  Procedure Laterality Date   CESAREAN SECTION     FOOT SURGERY       FAMILY HISTORY   Family History  Problem Relation Age of Onset   Healthy Mother    Diabetes Father    Breast cancer Neg Hx      SOCIAL HISTORY   Social History   Tobacco Use   Smoking status: Never   Smokeless tobacco: Never  Vaping Use   Vaping status: Never Used  Substance Use Topics   Alcohol use: Yes    Comment: Occasionally   Drug use: No     MEDICATIONS   Current Medication:  Current Facility-Administered Medications:    acetaminophen (TYLENOL) tablet 650 mg, 650 mg, Oral, Q4H PRN, 650 mg at 06/06/23 0356 **OR** acetaminophen (TYLENOL) 160 MG/5ML solution 650 mg, 650 mg, Per Tube, Q4H PRN **OR** acetaminophen (TYLENOL) suppository 650 mg, 650 mg, Rectal, Q4H PRN, Rejeana Brock, MD   amLODipine (NORVASC) tablet 5 mg, 5 mg, Oral, Daily, Harlon Ditty D, NP, 5 mg at 06/06/23 0901   atorvastatin (LIPITOR) tablet 80 mg, 80 mg, Oral, Daily, Vida Rigger, MD, 80 mg at 06/06/23 0901   Chlorhexidine Gluconate  Cloth 2 % PADS 6 each, 6 each, Topical, Daily, Ellanora Rayborn, MD, 6 each at 06/05/23 1525   famotidine (PEPCID) tablet 20 mg, 20 mg, Oral, BID, Karna Christmas, Ova Meegan, MD, 20 mg at 06/06/23 0901   HYDROcodone-acetaminophen (NORCO/VICODIN) 5-325 MG per tablet 1-2 tablet, 1-2 tablet, Oral, Q6H PRN, Harlon Ditty D, NP   insulin aspart (novoLOG) injection 0-20 Units, 0-20 Units, Subcutaneous, TID  AC & HS, Rust-Chester, Britton L, NP, 4 Units at 06/06/23 0904   labetalol (NORMODYNE) injection 10 mg, 10 mg, Intravenous, Q2H PRN, Harlon Ditty D, NP, 10 mg at 06/06/23 0732   melatonin tablet 2.5 mg, 2.5 mg, Oral, QHS PRN, Rust-Chester, Micheline Rough L, NP, 2.5 mg at 06/06/23 0010   morphine (PF) 2 MG/ML injection 2 mg, 2 mg, Intravenous, Q4H PRN, Harlon Ditty D, NP, 2 mg at 06/06/23 0902   nicardipine (CARDENE) 20mg  in 0.86% saline IV infusion (0.1 mg/ml), 0-15 mg/hr, Intravenous, Continuous, Harrison, Myah A, PA-C, Stopped at 06/05/23 1853   ondansetron (ZOFRAN) injection 4 mg, 4 mg, Intravenous, Q6H PRN, Harlon Ditty D, NP, 4 mg at 06/06/23 1610   Oral care mouth rinse, 15 mL, Mouth Rinse, PRN, Karna Christmas, Jerrel Tiberio, MD   polyethylene glycol (MIRALAX / GLYCOLAX) packet 17 g, 17 g, Oral, Daily PRN, Vida Rigger, MD, 17 g at 06/06/23 0600   senna-docusate (Senokot-S) tablet 1 tablet, 1 tablet, Oral, BID, Rejeana Brock, MD, 1 tablet at 06/06/23 9604    ALLERGIES   Patient has no known allergies.    REVIEW OF SYSTEMS   10 point review of systems completed and is negative except for lower extremity numbness and paresthesias in dizziness with headache  PHYSICAL EXAMINATION   Vital Signs: Temp:  [97.8 F (36.6 C)-98.6 F (37 C)] 98.3 F (36.8 C) (10/01 0400) Pulse Rate:  [78-106] 95 (10/01 0700) Resp:  [13-27] 21 (10/01 0700) BP: (111-169)/(62-123) 160/83 (10/01 0700) SpO2:  [98 %-100 %] 100 % (10/01 0700) Weight:  [49.9 kg-50.2 kg] 50.2 kg (10/01 0500)  GENERAL: Mild distress due to acute hemorrhagic CVA HEAD: Normocephalic, atraumatic.  EYES: Pupils equal, round, reactive to light.  No scleral icterus.  MOUTH: Moist mucosal membrane.  Symmetric smile NECK: Supple. No thyromegaly. No nodules. No JVD.  PULMONARY: Huel Cote to auscultation bilaterally CARDIOVASCULAR: S1 and S2. Regular rate and rhythm. No murmurs, rubs, or gallops.  GASTROINTESTINAL: Soft, nontender,  non-distended. No masses. Positive bowel sounds. No hepatosplenomegaly.  MUSCULOSKELETAL: No swelling, clubbing, or edema.  Strength 4 out of 4 in all 4 extremities NEUROLOGIC: Mild distress due to acute illness no acute FND grossly on examination SKIN:intact,warm,dry   PERTINENT DATA     Infusions:  niCARDipine Stopped (06/05/23 1853)   Scheduled Medications:  amLODipine  5 mg Oral Daily   atorvastatin  80 mg Oral Daily   Chlorhexidine Gluconate Cloth  6 each Topical Daily   famotidine  20 mg Oral BID   insulin aspart  0-20 Units Subcutaneous TID AC & HS   senna-docusate  1 tablet Oral BID   PRN Medications: acetaminophen **OR** acetaminophen (TYLENOL) oral liquid 160 mg/5 mL **OR** acetaminophen, HYDROcodone-acetaminophen, labetalol, melatonin, morphine injection, ondansetron (ZOFRAN) IV, mouth rinse, polyethylene glycol Hemodynamic parameters:   Intake/Output: 09/30 0701 - 10/01 0700 In: 1278.3 [P.O.:480; I.V.:298.3; IV Piggyback:500] Out: 325 [Urine:325]  Ventilator  Settings:     LAB RESULTS:  Basic Metabolic Panel: Recent Labs  Lab 06/05/23 1058  NA 138  K 3.8  CL 102  CO2 25  GLUCOSE 224*  BUN 13  CREATININE 0.52  CALCIUM 9.6   Liver Function Tests: Recent Labs  Lab 06/05/23 1058  AST 22  ALT 24  ALKPHOS 86  BILITOT 0.6  PROT 8.1  ALBUMIN 4.1   No results for input(s): "LIPASE", "AMYLASE" in the last 168 hours. No results for input(s): "AMMONIA" in the last 168 hours. CBC: Recent Labs  Lab 06/05/23 1058 06/06/23 0932  WBC 8.5 7.1  HGB 12.1 12.0  HCT 37.9 36.3  MCV 93.8 90.5  PLT 296 300   Cardiac Enzymes: No results for input(s): "CKTOTAL", "CKMB", "CKMBINDEX", "TROPONINI" in the last 168 hours. BNP: Invalid input(s): "POCBNP" CBG: Recent Labs  Lab 06/05/23 1645 06/05/23 2129 06/06/23 0235 06/06/23 0601 06/06/23 0735  GLUCAP 248* 292* 259* 168* 188*       IMAGING RESULTS:     ASSESSMENT AND PLAN     -Multidisciplinary rounds held today  Acute hemorrhagic CVA -Neurology consultation-appreciate input         Nicardipine blood pressure control parameters as per neurology -CT head reviewed with findings including -9 mm intraparenchymal hematoma in the right caudate head withintraventricular extension into the lateral, third, and fourth ventricles but no hydrocephalus -Statin -PT OT -Status post cervical spine CT chest with degenerative changes at C4-C5 through C6-C7 with up to moderate spinal canal stenosis at CT4-C5 -repeat Neuro imaging - no acute changes 06/06/23  Uncontrolled hyperglycemia with type 2 diabetes -Weight-based long-acting and short acting insulin with sliding scale ACHS -Coordinator consultation  Uncontrolled hypertension -Currently on nicardipine drip with parameters as per neurology   GI/Nutrition GI PROPHYLAXIS as indicated DIET-->TF's as tolerated Constipation protocol as indicated  ENDO - ICU hypoglycemic\Hyperglycemia protocol -check FSBS per protocol   ELECTROLYTES -follow labs as needed -replace as needed -pharmacy consultation   DVT/GI PRX ordered -SCDs  TRANSFUSIONS AS NEEDED MONITOR FSBS ASSESS the need for LABS as needed    Critical care provider statement:   Total critical care time: 33 minutes   Performed by: Karna Christmas MD   Critical care time was exclusive of separately billable procedures and treating other patients.   Critical care was necessary to treat or prevent imminent or life-threatening deterioration.   Critical care was time spent personally by me on the following activities: development of treatment plan with patient and/or surrogate as well as nursing, discussions with consultants, evaluation of patient's response to treatment, examination of patient, obtaining history from patient or surrogate, ordering and performing treatments and interventions, ordering and review of laboratory studies, ordering and review of  radiographic studies, pulse oximetry and re-evaluation of patient's condition.    Vida Rigger, M.D.  Pulmonary & Critical Care Medicine

## 2023-06-07 ENCOUNTER — Inpatient Hospital Stay (HOSPITAL_COMMUNITY)
Admit: 2023-06-07 | Discharge: 2023-06-07 | Disposition: A | Discharge status: Home / Self Care | Payer: 59 | Attending: Neurology | Admitting: Neurology

## 2023-06-07 ENCOUNTER — Inpatient Hospital Stay
Admit: 2023-06-07 | Discharge: 2023-06-07 | Disposition: A | Discharge status: Home / Self Care | Payer: 59 | Attending: Neurology | Admitting: Neurology

## 2023-06-07 DIAGNOSIS — I6389 Other cerebral infarction: Secondary | ICD-10-CM

## 2023-06-07 DIAGNOSIS — I619 Nontraumatic intracerebral hemorrhage, unspecified: Secondary | ICD-10-CM | POA: Diagnosis not present

## 2023-06-07 DIAGNOSIS — E1165 Type 2 diabetes mellitus with hyperglycemia: Secondary | ICD-10-CM | POA: Diagnosis present

## 2023-06-07 DIAGNOSIS — I10 Essential (primary) hypertension: Secondary | ICD-10-CM | POA: Diagnosis present

## 2023-06-07 LAB — ECHOCARDIOGRAM COMPLETE
AR max vel: 2.29 cm2
AV Peak grad: 6.9 mm[Hg]
Ao pk vel: 1.31 m/s
Area-P 1/2: 4.71 cm2
Height: 59 in
S' Lateral: 2.7 cm
Weight: 1802.48 [oz_av]

## 2023-06-07 LAB — CBC
HCT: 35.4 % — ABNORMAL LOW (ref 36.0–46.0)
Hemoglobin: 11.5 g/dL — ABNORMAL LOW (ref 12.0–15.0)
MCH: 29.9 pg (ref 26.0–34.0)
MCHC: 32.5 g/dL (ref 30.0–36.0)
MCV: 91.9 fL (ref 80.0–100.0)
Platelets: 299 10*3/uL (ref 150–400)
RBC: 3.85 MIL/uL — ABNORMAL LOW (ref 3.87–5.11)
RDW: 14 % (ref 11.5–15.5)
WBC: 6.7 10*3/uL (ref 4.0–10.5)
nRBC: 0 % (ref 0.0–0.2)

## 2023-06-07 LAB — GLUCOSE, CAPILLARY
Glucose-Capillary: 223 mg/dL — ABNORMAL HIGH (ref 70–99)
Glucose-Capillary: 194 mg/dL — ABNORMAL HIGH (ref 70–99)
Glucose-Capillary: 159 mg/dL — ABNORMAL HIGH (ref 70–99)
Glucose-Capillary: 224 mg/dL — ABNORMAL HIGH (ref 70–99)

## 2023-06-07 LAB — PHOSPHORUS: Phosphorus: 4.2 mg/dL (ref 2.5–4.6)

## 2023-06-07 LAB — BASIC METABOLIC PANEL
Anion gap: 10 (ref 5–15)
BUN: 19 mg/dL (ref 6–20)
CO2: 23 mmol/L (ref 22–32)
Calcium: 9.2 mg/dL (ref 8.9–10.3)
Chloride: 101 mmol/L (ref 98–111)
Creatinine, Ser: 0.63 mg/dL (ref 0.44–1.00)
GFR, Estimated: >60 mL/min (ref >60)
Glucose, Bld: 228 mg/dL — ABNORMAL HIGH (ref 70–99)
Potassium: 3.8 mmol/L (ref 3.5–5.1)
Sodium: 134 mmol/L — ABNORMAL LOW (ref 135–145)

## 2023-06-07 LAB — MAGNESIUM: Magnesium: 2 mg/dL (ref 1.7–2.4)

## 2023-06-07 MED ORDER — PROCHLORPERAZINE EDISYLATE 10 MG/2ML IJ SOLN
10.0000 mg | Freq: Once | INTRAMUSCULAR | Status: AC
  Administered 2023-06-07: 10 mg via INTRAVENOUS
  Filled 2023-06-07: qty 2

## 2023-06-07 MED ORDER — SODIUM CHLORIDE 0.9 % IV SOLN
12.5000 mg | Freq: Four times a day (QID) | INTRAVENOUS | Status: DC | PRN
  Filled 2023-06-07 (×2): qty 0.5

## 2023-06-07 MED ORDER — ENOXAPARIN SODIUM 40 MG/0.4ML IJ SOSY
40.0000 mg | PREFILLED_SYRINGE | INTRAMUSCULAR | Status: DC
  Administered 2023-06-07 – 2023-06-08 (×2): 40 mg via SUBCUTANEOUS
  Filled 2023-06-07 (×2): qty 0.4

## 2023-06-07 MED ORDER — INSULIN GLARGINE-YFGN 100 UNIT/ML ~~LOC~~ SOLN
12.0000 [IU] | Freq: Every day | SUBCUTANEOUS | Status: DC
  Administered 2023-06-07: 12 [IU] via SUBCUTANEOUS
  Filled 2023-06-07 (×2): qty 0.12

## 2023-06-07 MED ORDER — DIPHENHYDRAMINE HCL 50 MG/ML IJ SOLN
12.5000 mg | Freq: Once | INTRAMUSCULAR | Status: AC
  Administered 2023-06-07: 12.5 mg via INTRAVENOUS
  Filled 2023-06-07: qty 1

## 2023-06-07 MED ORDER — LIVING WELL WITH DIABETES BOOK
Freq: Once | Status: AC
  Filled 2023-06-07: qty 1

## 2023-06-07 MED ORDER — VALPROATE SODIUM 100 MG/ML IV SOLN
1000.0000 mg | Freq: Once | INTRAVENOUS | Status: AC
  Administered 2023-06-07: 1000 mg via INTRAVENOUS
  Filled 2023-06-07: qty 10

## 2023-06-07 MED ORDER — INSULIN ASPART 100 UNIT/ML IJ SOLN
0.0000 [IU] | Freq: Three times a day (TID) | INTRAMUSCULAR | Status: DC
  Administered 2023-06-07 – 2023-06-08 (×2): 2 [IU] via SUBCUTANEOUS
  Administered 2023-06-08: 7 [IU] via SUBCUTANEOUS
  Administered 2023-06-08 – 2023-06-09 (×2): 2 [IU] via SUBCUTANEOUS
  Filled 2023-06-07 (×4): qty 1

## 2023-06-07 MED ORDER — INSULIN ASPART 100 UNIT/ML IJ SOLN
0.0000 [IU] | Freq: Every day | INTRAMUSCULAR | Status: DC
  Administered 2023-06-07: 2 [IU] via SUBCUTANEOUS
  Filled 2023-06-07: qty 1

## 2023-06-07 NOTE — Progress Notes (Signed)
  Progress Note   Patient: Ariana ALKINS UYQ:[id number] DOB: 12/05/1965 DOA: 06/05/2023     2 DOS: the patient was seen and examined on 06/07/2023   Brief hospital course: From admission to ICU:  "57 year old female with a history of uncontrolled hypertension, diabetes who came into the ED reporting worsening weakness over the last couple of months and progressively worse over the last week. She also reports headache that started to get worse over the last few days. She reports noting lower extremity weakness and difficulty with ambulation over the last few weeks also reporting paresthesias and numbness of lower extremities. She was evaluated in the ED for acute CVA and was found to have hemorrhagic stroke on CT head. Neurology had evaluated patient with recommendations for blood pressure control utilizing nicardipine as well as avoidance of any anticoagulation. PCCM admission for acute hemorrhagic CVA with frequent neurochecks. "   TRH team assumed care on 06/07/2023. Neurology following.  10/2 -- pt has ongoing headaches with photophobia. Neurology started compazine and benadryl.   Assessment and Plan: * Hemorrhagic stroke [facility]) Neurology following - appreciate recs --BP control with PRN's for SBP > 160 --Mgmt as outlined below   Uncontrolled hypertension Initially on nicardipine drip in ICU. Maintain SBP < 160 given hemorrhagic stroke. Continue amlodipine 5 mg, lisinopril 20 mg, hydrochlorothiazide 25 mg PRN IV labetalol if SBP > 160  Type 2 diabetes mellitus with hyperglycemia (HCC) Hbg A1c 9.8% - poorly controlled. Insulin per orders: Semglee 12 units at bedtime, Sliding scale Novolog 0-9 units TID WC, 0-5 units at bedtime Adjust insulin for goal 140-180 PCP follow up  Acute nonintractable headache Mgmt per Neurology - appreciate recs: Compazine & benadryl & Depakote        Subjective: Pt seen in ICU this AM, up in recliner, eyes closed but awake.  She reports ongoing  headache. No other complaints    Physical Exam: Vitals:   06/07/23 1445 06/07/23 1500 06/07/23 1530 06/07/23 1600  BP: (!) 160/82 (!) 152/76 (!) 140/76 139/71  Pulse: 93 90 89 89  Resp: 15 18 16 15   Temp:    98.4 F (36.9 C)  TempSrc:      SpO2: 97% 96% 97% 97%  Weight:      Height:       General exam: awake, alert, no acute distress HEENT: eyes closed, moist mucus membranes, hearing grossly normal  Respiratory system: CTAB, no wheezes, rales or rhonchi, normal respiratory effort. Cardiovascular system: normal S1/S2, RRR, no JVD, murmurs, rubs, gallops, no pedal edema.   Gastrointestinal system: soft, NT, ND, no HSM felt, +bowel sounds. Central nervous system: A&O x 3. normal speech, limited exam due to pt condition in severe pain Extremities: moves all, no edema, normal tone Skin: dry, intact, normal temperature Psychiatry: normal mood, congruent affect   Data Reviewed:  Notable labs ---  Na 134, glucose 228, Hbg 11.5  Echo EF 60-65%, grade I diastolic dysfunction, mild-mod TR  Family Communication: None present  Disposition: Status is: Inpatient Remains inpatient appropriate because: Ongoing evaluation and close BP monitoring, ongoing headaches requiring IV meds   Planned Discharge Destination: Home    Time spent: 45 minutes  Author: Pennie Banter, DO 06/07/2023 4:22 PM  For on call review [email or web address].

## 2023-06-07 NOTE — Progress Notes (Signed)
Subjective: Headache is gradually improving, but still a problem.  Exam: Vitals:   06/07/23 1500 06/07/23 1530  BP: (!) 152/76 (!) 140/76  Pulse: 90 89  Resp: 18 16  Temp:    SpO2: 96% 97%   Gen: In bed, NAD Resp: non-labored breathing, no acute distress Abd: soft, nt  Neuro: MS: Awake, alert able to follow commands well, completely oriented CN: Visual fields are full, EOMI Motor: 5/5 throughout Sensory: Intact to light touch  Pertinent Labs: Glucose 229  Impression: 57 year old female with likely hypertensive hemorrhage.  She will need continued blood pressure management as her primary means of secondary prevention.  With her significant photophobia, I will try treating this as migraine, with Compazine and Depakote.  Recommendations: 1) continue BP control, would continue using PRNs for SBP > 160 2) Compazine, 10 mg Benadryl 12.5 mg, Depakote 1 g 3) neurology will follow  Ritta Slot, MD [facility] [phone]  If 7pm- 7am, please page neurology on call as listed in AMION.

## 2023-06-07 NOTE — Inpatient Diabetes Management (Signed)
Inpatient Diabetes Program Recommendations  AACE/ADA: New Consensus Statement on Inpatient Glycemic Control (2015)  Target Ranges:  Prepandial:   less than 140 mg/dL      Peak postprandial:   less than 180 mg/dL (1-2 hours)      Critically ill patients:  140 - 180 mg/dL   Lab Results  Component Value Date   GLUCAP 223 (H) 06/07/2023   HGBA1C 9.8 (H) 06/06/2023    Latest Reference Range & Units 06/06/23 06:01 06/06/23 07:35 06/06/23 11:37 06/06/23 16:58 06/06/23 21:59 06/07/23 07:29  Glucose-Capillary 70 - 99 mg/dL 829 (H) 562 (H) 130 (H) 178 (H) 187 (H) 223 (H)  (H): Data is abnormally high   Diabetes history: DM2 Outpatient Diabetes medications: Lantus 24 units qd  Jardiance 10 every day, Metformin 1000 BID  Current orders for Inpatient glycemic control: Novolog 0-20 units correction qid  Inpatient Diabetes Program Recommendations:   Patient received total of Novolog 19 units over the past 24 hrs. Please consider: -Add Semglee 12 units daily -Decrease Novolog correction to 0-9 units tid, 0-5 units hs  Thank you, Darel Hong E. Georgana Romain, RN, MSN, CDE  Diabetes Coordinator Inpatient Glycemic Control Team Team Pager [phone] (8am-5pm) 06/07/2023 11:45 AM

## 2023-06-07 NOTE — Assessment & Plan Note (Addendum)
Hbg A1c 9.8% - poorly controlled. --Managed with basal and short-acting insulin --Home oral agents were held - resume at d/c  --Appreciate diabetes coordinator input --PCP follow up

## 2023-06-07 NOTE — Assessment & Plan Note (Addendum)
HTN Emergency - POA > resolved Initially on nicardipine drip in ICU. --Maintain SBP < 160 given hemorrhagic stroke, per neurology recommendations. --Increased amlodipine 5 >> 10 mg --Continue lisinopril 20 mg, hydrochlorothiazide 25 mg

## 2023-06-07 NOTE — Assessment & Plan Note (Addendum)
Mgmt per Neurology - appreciate recs: Headache resolved with compazine & benadryl & depakote

## 2023-06-07 NOTE — Evaluation (Signed)
Occupational Therapy Evaluation Patient Details Name: Ariana Jacobs MRN: [id number] DOB: 08-05-1966 Today's Date: 06/07/2023   History of Present Illness 57 year old female with a history of uncontrolled hypertension, diabetes who came into the ED reporting headache that started to get worse over the last few days, lower extremity weakness, and difficulty with ambulation over the last few weeks also reporting paresthesias and numbness of lower extremities.  She was evaluated in the ED for acute CVA and was found to have hemorrhagic stroke on CT head. Neurology had evaluated patient with recommendations for blood pressure control utilizing nicardipine as well as avoidance of any anticoagulation.   Clinical Impression   Ms Pask was seen for OT evaluation this date. Prior to hospital admission, pt was IND. Pt lives alone in 1st floor apartment. Pt presents to acute OT demonstrating impaired ADL performance and functional mobility 2/2 decreased activity tolerance and functional balance deficits. Pt currently requires CGA for ADL t/f, tolerates ~20 ft, intermittent furniture walking and mild postural sway noted as pt keeps eyes closed citing light sensitivity. IND don/doff socks in sitting. Pt would benefit from skilled OT to address noted impairments and functional limitations (see below for any additional details). Upon hospital discharge, recommend OT follow up.     If plan is discharge home, recommend the following: A little help with walking and/or transfers;A little help with bathing/dressing/bathroom;Help with stairs or ramp for entrance    Functional Status Assessment  Patient has had a recent decline in their functional status and demonstrates the ability to make significant improvements in function in a reasonable and predictable amount of time.  Equipment Recommendations  BSC/3in1    Recommendations for Other Services       Precautions / Restrictions Precautions Precautions:  Fall Restrictions Weight Bearing Restrictions: No      Mobility Bed Mobility Overal bed mobility: Independent                  Transfers Overall transfer level: Needs assistance Equipment used: None Transfers: Sit to/from Stand Sit to Stand: Contact guard assist                  Balance Overall balance assessment: Needs assistance Sitting-balance support: No upper extremity supported, Feet supported Sitting balance-Leahy Scale: Normal     Standing balance support: No upper extremity supported, During functional activity Standing balance-Leahy Scale: Fair                             ADL either performed or assessed with clinical judgement   ADL Overall ADL's : Needs assistance/impaired                                       General ADL Comments: CGA for toilet t/f, intermittent furniture walking and mild postural sway noted as pt keeps eyes closed. IND don/doff socks in sitting.     Vision Patient Visual Report: Eye fatigue/eye pain/headache              Pertinent Vitals/Pain Pain Assessment Pain Assessment: No/denies pain (endorses light sensitivity)     Extremity/Trunk Assessment Upper Extremity Assessment Upper Extremity Assessment: Overall WFL for tasks assessed   Lower Extremity Assessment Lower Extremity Assessment: Generalized weakness       Communication Communication Communication: No apparent difficulties   Cognition Arousal: Alert Behavior During Therapy: WFL for tasks  assessed/performed Overall Cognitive Status: Within Functional Limits for tasks assessed                                 General Comments: elects to keep eyes closed t/o majority of session citing light sensitivity                Home Living Family/patient expects to be discharged to:: Private residence Living Arrangements: Alone Available Help at Discharge: Family Type of Home: Apartment Home Access: Level  entry     Home Layout: One level               Home Equipment: None   Additional Comments: reports she could stay with her mother in house with 3 STE if assistance required      Prior Functioning/Environment Prior Level of Function : Independent/Modified Independent;Driving                        OT Problem List: Decreased activity tolerance;Impaired balance (sitting and/or standing);Decreased safety awareness      OT Treatment/Interventions: Self-care/ADL training;Therapeutic exercise;Energy conservation;DME and/or AE instruction;Therapeutic activities;Patient/family education;Balance training;Visual/perceptual remediation/compensation    OT Goals(Current goals can be found in the care plan section) Acute Rehab OT Goals Patient Stated Goal: to go home OT Goal Formulation: With patient Time For Goal Achievement: 06/21/23 Potential to Achieve Goals: Good ADL Goals Pt Will Perform Grooming: Independently;standing Pt Will Perform Lower Body Dressing: Independently;sit to/from stand Pt Will Transfer to Toilet: Independently;ambulating;regular height toilet  OT Frequency: Min 1X/week    Co-evaluation              AM-PAC OT "6 Clicks" Daily Activity     Outcome Measure Help from another person eating meals?: None Help from another person taking care of personal grooming?: None Help from another person toileting, which includes using toliet, bedpan, or urinal?: A Little Help from another person bathing (including washing, rinsing, drying)?: A Little Help from another person to put on and taking off regular upper body clothing?: None Help from another person to put on and taking off regular lower body clothing?: None 6 Click Score: 22   End of Session Equipment Utilized During Treatment: Gait belt Nurse Communication: Mobility status  Activity Tolerance: Patient tolerated treatment well Patient left: in chair;with call bell/phone within reach  OT Visit  Diagnosis: Other abnormalities of gait and mobility (R26.89);Muscle weakness (generalized) (M62.81)                Time: 0454-0981 OT Time Calculation (min): 17 min Charges:  OT General Charges $OT Visit: 1 Visit OT Evaluation $OT Eval Moderate Complexity: 1 Mod  Kathie Dike, M.S. OTR/L  06/07/23, 10:22 AM  ascom [phone]

## 2023-06-07 NOTE — Progress Notes (Signed)
Echocardiogram 2D Echocardiogram has been performed.  Ariana Jacobs 06/07/2023, 1:19 PM

## 2023-06-07 NOTE — Assessment & Plan Note (Addendum)
Neurology following - appreciate recs --BP control with goal SBP < 160 --Mgmt as outlined below   Findings of mild surrounding edema not felt to be clinically significant

## 2023-06-07 NOTE — Evaluation (Signed)
Physical Therapy Evaluation Patient Details Name: RUSHIE BRAZEL MRN: [id number] DOB: 05-12-1966 Today's Date: 06/07/2023  History of Present Illness  57 year old female with a history of uncontrolled hypertension, diabetes who came into the ED reporting headache that started to get worse over the last few days, lower extremity weakness, and difficulty with ambulation over the last few weeks also reporting paresthesias and numbness of lower extremities.  She was evaluated in the ED for acute CVA and was found to have hemorrhagic stroke on CT head. Neurology had evaluated patient with recommendations for blood pressure control utilizing nicardipine as well as avoidance of any anticoagulation.  Clinical Impression  Pt is received in recliner, she is agreeable to PT session. At baseline Pt reports amb without AD and independent with ADL/IADLs. At beginning of session, Pt endorses light sensitivity but encouraged to maintain eyes open throughout session for safety. Pt performs transfers close sup and amb with RW CGA. Pt able to amb approx 20 ft using RW with occasional cuing to keep eyes open. Pt limited to increase amb distance at this time secondary to reports of "nagging sinus pain"-RN notified of Pt's request for meds. Educated Pt on use of RW at this time to increase stability and prevent falls-Pt verbalized agreement and understanding. Pt would benefit from skilled PT to address above deficits and promote optimal return to PLOF.       If plan is discharge home, recommend the following: A little help with walking and/or transfers;A little help with bathing/dressing/bathroom;Assist for transportation;Help with stairs or ramp for entrance   Can travel by private vehicle        Equipment Recommendations Rolling walker (2 wheels)  Recommendations for Other Services       Functional Status Assessment Patient has had a recent decline in their functional status and demonstrates the ability to make  significant improvements in function in a reasonable and predictable amount of time.     Precautions / Restrictions Precautions Precautions: Fall Restrictions Weight Bearing Restrictions: No      Mobility  Bed Mobility               General bed mobility comments: Not assessed due to Pt received in recliner and ended in recliner    Transfers Overall transfer level: Needs assistance Equipment used: Rolling walker (2 wheels) Transfers: Sit to/from Stand Sit to Stand: Supervision           General transfer comment: min cuing for hand placement during STS; otherwise sup A    Ambulation/Gait Ambulation/Gait assistance: Contact guard assist Gait Distance (Feet): 20 Feet Assistive device: Rolling walker (2 wheels) Gait Pattern/deviations: Step-through pattern, Shuffle, Decreased step length - left, Decreased step length - right, Decreased stride length Gait velocity: decreased     General Gait Details: able to amb approx 20 ft using RW with cuing to maintain eyes open  Stairs            Wheelchair Mobility     Tilt Bed    Modified Rankin (Stroke Patients Only)       Balance Overall balance assessment: Needs assistance Sitting-balance support: No upper extremity supported, Feet supported Sitting balance-Leahy Scale: Normal     Standing balance support: No upper extremity supported, During functional activity Standing balance-Leahy Scale: Good Standing balance comment: able to maintain static standing balance using RW with no LOB noted  Pertinent Vitals/Pain Pain Assessment Pain Assessment: 0-10 (endorses sensitivity to light) Pain Score: 1  Pain Location: sinus pressure Pain Descriptors / Indicators: Dull Pain Intervention(s): Monitored during session, Patient requesting pain meds-RN notified, Limited activity within patient's tolerance    Home Living Family/patient expects to be discharged to:: Private  residence Living Arrangements: Alone Available Help at Discharge: Family Type of Home: Apartment Home Access: Level entry       Home Layout: One level Home Equipment: None Additional Comments: reports she could stay with her mother in house with 3 STE if assistance required    Prior Function Prior Level of Function : Independent/Modified Independent;Driving                     Extremity/Trunk Assessment   Upper Extremity Assessment Upper Extremity Assessment: Defer to OT evaluation    Lower Extremity Assessment Lower Extremity Assessment: Generalized weakness       Communication   Communication Communication: No apparent difficulties Cueing Techniques: Verbal cues  Cognition Arousal: Alert Behavior During Therapy: WFL for tasks assessed/performed Overall Cognitive Status: Within Functional Limits for tasks assessed                                 General Comments: elects to keep eyes closed t/o majority of session citing light sensitivity; pleasant and cooperative        General Comments      Exercises Other Exercises Other Exercises: Edu on benefits of using RW for amb at this time for safety   Assessment/Plan    PT Assessment Patient needs continued PT services  PT Problem List Decreased strength;Decreased mobility;Decreased activity tolerance;Decreased balance;Pain       PT Treatment Interventions DME instruction;Gait training;Functional mobility training;Stair training;Therapeutic activities;Therapeutic exercise;Balance training    PT Goals (Current goals can be found in the Care Plan section)  Acute Rehab PT Goals Patient Stated Goal: to go home PT Goal Formulation: With patient Time For Goal Achievement: 06/21/23 Potential to Achieve Goals: Good    Frequency Min 1X/week     Co-evaluation               AM-PAC PT "6 Clicks" Mobility  Outcome Measure Help needed turning from your back to your side while in a flat bed  without using bedrails?: A Little Help needed moving from lying on your back to sitting on the side of a flat bed without using bedrails?: A Little Help needed moving to and from a bed to a chair (including a wheelchair)?: A Little Help needed standing up from a chair using your arms (e.g., wheelchair or bedside chair)?: None Help needed to walk in hospital room?: A Little Help needed climbing 3-5 steps with a railing? : A Little 6 Click Score: 19    End of Session   Activity Tolerance: Patient tolerated treatment well Patient left: in chair;with call bell/phone within reach Nurse Communication: Mobility status PT Visit Diagnosis: Unsteadiness on feet (R26.81);Muscle weakness (generalized) (M62.81)    Time: 6045-4098 PT Time Calculation (min) (ACUTE ONLY): 15 min   Charges:                 Elmon Else, SPT   Shanice Poznanski 06/07/2023, 11:02 AM

## 2023-06-08 DIAGNOSIS — I619 Nontraumatic intracerebral hemorrhage, unspecified: Secondary | ICD-10-CM | POA: Diagnosis not present

## 2023-06-08 LAB — BASIC METABOLIC PANEL
Anion gap: 12 (ref 5–15)
BUN: 20 mg/dL (ref 6–20)
CO2: 26 mmol/L (ref 22–32)
Calcium: 9.5 mg/dL (ref 8.9–10.3)
Chloride: 97 mmol/L — ABNORMAL LOW (ref 98–111)
Creatinine, Ser: 0.61 mg/dL (ref 0.44–1.00)
GFR, Estimated: >60 mL/min (ref >60)
Glucose, Bld: 200 mg/dL — ABNORMAL HIGH (ref 70–99)
Potassium: 3.5 mmol/L (ref 3.5–5.1)
Sodium: 135 mmol/L (ref 135–145)

## 2023-06-08 LAB — GLUCOSE, CAPILLARY
Glucose-Capillary: 190 mg/dL — ABNORMAL HIGH (ref 70–99)
Glucose-Capillary: 301 mg/dL — ABNORMAL HIGH (ref 70–99)
Glucose-Capillary: 219 mg/dL — ABNORMAL HIGH (ref 70–99)
Glucose-Capillary: 200 mg/dL — ABNORMAL HIGH (ref 70–99)

## 2023-06-08 MED ORDER — POLYETHYLENE GLYCOL 3350 17 G PO PACK
17.0000 g | PACK | Freq: Every day | ORAL | Status: DC
  Administered 2023-06-08 – 2023-06-09 (×2): 17 g via ORAL
  Filled 2023-06-08 (×2): qty 1

## 2023-06-08 MED ORDER — INSULIN GLARGINE-YFGN 100 UNIT/ML ~~LOC~~ SOLN
14.0000 [IU] | Freq: Every day | SUBCUTANEOUS | Status: DC
  Administered 2023-06-08: 14 [IU] via SUBCUTANEOUS
  Filled 2023-06-08 (×2): qty 0.14

## 2023-06-08 MED ORDER — AMLODIPINE BESYLATE 10 MG PO TABS
10.0000 mg | ORAL_TABLET | Freq: Every day | ORAL | Status: DC
  Administered 2023-06-09: 10 mg via ORAL
  Filled 2023-06-08: qty 1

## 2023-06-08 MED ORDER — AMLODIPINE BESYLATE 5 MG PO TABS
5.0000 mg | ORAL_TABLET | Freq: Once | ORAL | Status: AC
  Administered 2023-06-08: 5 mg via ORAL
  Filled 2023-06-08: qty 1

## 2023-06-08 MED ORDER — INSULIN ASPART 100 UNIT/ML IJ SOLN
4.0000 [IU] | Freq: Three times a day (TID) | INTRAMUSCULAR | Status: DC
  Administered 2023-06-08 – 2023-06-09 (×3): 4 [IU] via SUBCUTANEOUS
  Filled 2023-06-08 (×2): qty 1

## 2023-06-08 NOTE — Progress Notes (Signed)
Occupational Therapy Treatment Patient Details Name: Ariana Jacobs MRN: [id number] DOB: 13-Sep-1965 Today's Date: 06/08/2023   History of present illness 57 year old female with a history of uncontrolled hypertension, diabetes who came into the ED reporting headache that started to get worse over the last few days, lower extremity weakness, and difficulty with ambulation over the last few weeks also reporting paresthesias and numbness of lower extremities.  She was evaluated in the ED for acute CVA and was found to have hemorrhagic stroke on CT head. Neurology had evaluated patient with recommendations for blood pressure control utilizing nicardipine as well as avoidance of any anticoagulation.   OT comments  Pt seen for brief OT tx. Pt received in bathroom seated on BSC frame over the toilet with RW in front of her and linens hamper between her RW and the wall, presenting a challenge for mobility and safety hazard. OT repositioned bathroom for improved access and safety. Pt completed toilet transfer and pericare with supv. She endorsed her headache returning and requesting to return to bed. Pt required VC to wash her hands after accidentally applying hand soap instead of hand sanitizer. Pt returned to bed, educated in call bell. Bed alarm set and encouraged to notify nursing for any needs. Pt educated in environmental/stimuli to support headache relief. Volume cut off from tv, lights off, door closed to limit stimulus. Pt verbalized understanding. Further OT deferred 2/2 headache. RN notified. Pt continues to benefit from skilled OT.       If plan is discharge home, recommend the following:  A little help with walking and/or transfers;A little help with bathing/dressing/bathroom;Help with stairs or ramp for entrance   Equipment Recommendations  BSC/3in1    Recommendations for Other Services      Precautions / Restrictions Precautions Precautions: Fall Restrictions Weight Bearing  Restrictions: No       Mobility Bed Mobility Overal bed mobility: Needs Assistance Bed Mobility: Sit to Supine       Sit to supine: Modified independent (Device/Increase time)        Transfers Overall transfer level: Needs assistance Equipment used: Rolling walker (2 wheels) Transfers: Sit to/from Stand Sit to Stand: Supervision                 Balance Overall balance assessment: Needs assistance Sitting-balance support: No upper extremity supported, Feet supported Sitting balance-Leahy Scale: Normal     Standing balance support: No upper extremity supported, During functional activity Standing balance-Leahy Scale: Fair                             ADL either performed or assessed with clinical judgement   ADL                                         General ADL Comments: CGA for toilet transfer, VC for safety and RW mgt, and attempts to use handsoap as hand sanitizer requiring VC to wash fully. supv for standing pericare    Extremity/Trunk Assessment              Vision       Perception     Praxis      Cognition Arousal: Alert Behavior During Therapy: WFL for tasks assessed/performed Overall Cognitive Status: Within Functional Limits for tasks assessed  General Comments: grossly WFL except decreased safety awareness        Exercises Other Exercises Other Exercises: Pt educated in environmental/stimuli to support headache relief    Shoulder Instructions       General Comments      Pertinent Vitals/ Pain       Pain Assessment Pain Assessment: Faces Faces Pain Scale: Hurts whole lot Pain Location: headache Pain Descriptors / Indicators: Grimacing, Headache Pain Intervention(s): Monitored during session, Repositioned, Premedicated before session  Home Living                                          Prior Functioning/Environment               Frequency  Min 1X/week        Progress Toward Goals  OT Goals(current goals can now be found in the care plan section)  Progress towards OT goals: Progressing toward goals  Acute Rehab OT Goals Patient Stated Goal: to go home OT Goal Formulation: With patient Time For Goal Achievement: 06/21/23 Potential to Achieve Goals: Good  Plan      Co-evaluation                 AM-PAC OT "6 Clicks" Daily Activity     Outcome Measure   Help from another person eating meals?: None Help from another person taking care of personal grooming?: None Help from another person toileting, which includes using toliet, bedpan, or urinal?: A Little Help from another person bathing (including washing, rinsing, drying)?: A Little Help from another person to put on and taking off regular upper body clothing?: None Help from another person to put on and taking off regular lower body clothing?: None 6 Click Score: 22    End of Session Equipment Utilized During Treatment: Rolling walker (2 wheels)  OT Visit Diagnosis: Other abnormalities of gait and mobility (R26.89);Muscle weakness (generalized) (M62.81)   Activity Tolerance Patient tolerated treatment well   Patient Left in bed;with call bell/phone within reach;with bed alarm set   Nurse Communication Mobility status;Other (comment) (HA)        Time: 1610-9604 OT Time Calculation (min): 8 min  Charges: OT General Charges $OT Visit: 1 Visit OT Treatments $Self Care/Home Management : 8-22 mins  Arman Filter., MPH, MS, OTR/L ascom [phone] 06/08/23, 4:31 PM

## 2023-06-08 NOTE — Plan of Care (Signed)
Patient moved out of ICU today.  Still having headaches.  Ambulated well in room.  Follow up CT head tomorrow to follow bleed.

## 2023-06-08 NOTE — Inpatient Diabetes Management (Addendum)
Inpatient Diabetes Program Recommendations  AACE/ADA: New Consensus Statement on Inpatient Glycemic Control (2015)  Target Ranges:  Prepandial:   less than 140 mg/dL      Peak postprandial:   less than 180 mg/dL (1-2 hours)      Critically ill patients:  140 - 180 mg/dL    Latest Reference Range & Units 06/06/23 03:18  Hemoglobin A1C 4.8 - 5.6 % 9.8 (H)  234 mg/dl  (H): Data is abnormally high  Latest Reference Range & Units 06/07/23 07:29 06/07/23 11:54 06/07/23 15:26 06/07/23 21:41  Glucose-Capillary 70 - 99 mg/dL 295 (H)  7 units Novolog  194 (H)  4 units Novolog  159 (H)  2 units Novolog  224 (H)  2 units Novolog  12 units Semglee    Latest Reference Range & Units 06/08/23 07:51 06/08/23 11:36  Glucose-Capillary 70 - 99 mg/dL 284 (H)  2 units Novolog 301 (H)  7 units Novolog   (H): Data is abnormally high  Admit with: Hemorrhagic stroke/ Uncontrolled hypertension   History: DM  Home DM Meds: Lantus 24 units at HS       Jardiance 25 mg daily       Metformin 1000 mg BID  Current Orders: Semglee 12 units at bedtime     Novolog Sensitive Correction Scale/ SSI (0-9 units) TID AC + HS   MD- Note Semglee 12 units at bedtime started last PM  CBG 190 this AM--301 at Lunch--Ate 100%   1. Please consider increasing the Semglee slightly to 14 units at bedtime  2. Please also consider starting Novolog Meal Coverage: Novolog 4 units TID with meals HOLD if pt NPO HOLD if pt eats <50% meals    Addendum 11:45am--Met w/ pt at bedside in the ICU.  She was sleeping soundly in the recliner when I woke her.  Pt told me she had to use the bathroom so I went and got the RN to provide help with her needs.  Will try to re-visit with pt when she is less drowsy and on regular nursing unit.     --Will follow patient during hospitalization--  Ambrose Finland RN, MSN, CDCES Diabetes Coordinator Inpatient Glycemic Control Team Team Pager: [phone] (8a-5p)

## 2023-06-08 NOTE — Progress Notes (Signed)
Subjective: Feels much better, no headache this am  Exam: Vitals:   06/08/23 0800 06/08/23 0830  BP: (!) 153/82 (!) 180/86  Pulse: 94   Resp: 19   Temp: 98.7 F (37.1 C)   SpO2: 96%    Gen: In bed, NAD Resp: non-labored breathing, no acute distress Abd: soft, nt  Neuro: MS: Awake, alert able to follow commands well, completely oriented CN: Visual fields are full, EOMI Motor: 5/5 throughout Sensory: Intact to light touch   Impression: 57 year old female with likely hypertensive hemorrhage.  She will need continued blood pressure management as her primary means of secondary prevention.  Headache responded well to migraine cocktail, and she is seating well this am.   Recommendations: 1) continue BP control with goal of normotension 2) she can follow up with outpatient neurology.  3) please call with further questions or concerns.   Ritta Slot, MD [facility] [phone]  If 7pm- 7am, please page neurology on call as listed in AMION.

## 2023-06-08 NOTE — Plan of Care (Signed)
  Problem: Education: Goal: Knowledge of General Education information will improve Description: Including pain rating scale, medication(s)/side effects and non-pharmacologic comfort measures Outcome: Progressing   Problem: Health Behavior/Discharge Planning: Goal: Ability to manage health-related needs will improve Outcome: Progressing   Problem: Clinical Measurements: Goal: Will remain free from infection Outcome: Progressing Goal: Respiratory complications will improve Outcome: Progressing   Problem: Activity: Goal: Risk for activity intolerance will decrease Outcome: Progressing   Problem: Coping: Goal: Level of anxiety will decrease Outcome: Progressing   

## 2023-06-08 NOTE — Progress Notes (Signed)
Physical Therapy Treatment Patient Details Name: Ariana Jacobs MRN: [id number] DOB: 12-02-1965 Today's Date: 06/08/2023   History of Present Illness 57 year old female with a history of uncontrolled hypertension, diabetes who came into the ED reporting headache that started to get worse over the last few days, lower extremity weakness, and difficulty with ambulation over the last few weeks also reporting paresthesias and numbness of lower extremities.  She was evaluated in the ED for acute CVA and was found to have hemorrhagic stroke on CT head. Neurology had evaluated patient with recommendations for blood pressure control utilizing nicardipine as well as avoidance of any anticoagulation.    PT Comments  Pt is received in recliner, she is agreeable to PT session. At beginning of session, Pt reports improved HA symptoms and light not affecting her. Pt performs transfers and amb sup A for safety. Pt able to amb approx 125 ft using RW with occasional cuing for AD management and encouragement to increase stride lengths during activity. Pt able to tolerate session without reports of HA or discomfort and demonstrates improvement towards PT goals. Pt in recliner at end of session to finish meal. Pt would benefit from cont skilled PT to address above deficits and promote optimal return to PLOF.   If plan is discharge home, recommend the following: A little help with walking and/or transfers;A little help with bathing/dressing/bathroom;Assist for transportation;Help with stairs or ramp for entrance   Can travel by private vehicle        Equipment Recommendations  Rolling walker (2 wheels)    Recommendations for Other Services       Precautions / Restrictions Precautions Precautions: Fall Restrictions Weight Bearing Restrictions: No     Mobility  Bed Mobility               General bed mobility comments: Not assessed due to Pt received in recliner and ended in recliner     Transfers Overall transfer level: Needs assistance Equipment used: Rolling walker (2 wheels) Transfers: Sit to/from Stand Sit to Stand: Supervision           General transfer comment: min cuing for hand placement during STS; otherwise sup A    Ambulation/Gait Ambulation/Gait assistance: Supervision Gait Distance (Feet): 125 Feet Assistive device: Rolling walker (2 wheels) Gait Pattern/deviations: Step-through pattern, Decreased step length - left, Decreased step length - right, Decreased stride length Gait velocity: slight decreased     General Gait Details: able to amb approx 125 ft using RW; occasional cuing for AD management to prevent fall risk and taking bigger steps   Stairs             Wheelchair Mobility     Tilt Bed    Modified Rankin (Stroke Patients Only)       Balance Overall balance assessment: Needs assistance Sitting-balance support: No upper extremity supported, Feet supported Sitting balance-Leahy Scale: Normal     Standing balance support: No upper extremity supported, During functional activity Standing balance-Leahy Scale: Good Standing balance comment: able to maintain static standing balance using RW with no LOB noted                            Cognition Arousal: Alert Behavior During Therapy: WFL for tasks assessed/performed Overall Cognitive Status: Within Functional Limits for tasks assessed  General Comments: Pleasant and cooperative during therapy        Exercises Other Exercises Other Exercises: toileting; close sup for peri-care    General Comments        Pertinent Vitals/Pain Pain Assessment Pain Assessment: No/denies pain    Home Living                          Prior Function            PT Goals (current goals can now be found in the care plan section) Acute Rehab PT Goals Patient Stated Goal: to go home PT Goal Formulation: With  patient Time For Goal Achievement: 06/21/23 Potential to Achieve Goals: Good Progress towards PT goals: Progressing toward goals    Frequency    Min 1X/week      PT Plan      Co-evaluation              AM-PAC PT "6 Clicks" Mobility   Outcome Measure  Help needed turning from your back to your side while in a flat bed without using bedrails?: None Help needed moving from lying on your back to sitting on the side of a flat bed without using bedrails?: None Help needed moving to and from a bed to a chair (including a wheelchair)?: A Little Help needed standing up from a chair using your arms (e.g., wheelchair or bedside chair)?: A Little Help needed to walk in hospital room?: A Little Help needed climbing 3-5 steps with a railing? : A Little 6 Click Score: 20    End of Session   Activity Tolerance: Patient tolerated treatment well Patient left: in chair;with call bell/phone within reach;with SCD's reapplied Nurse Communication: Mobility status PT Visit Diagnosis: Unsteadiness on feet (R26.81);Muscle weakness (generalized) (M62.81)     Time: 6295-2841 PT Time Calculation (min) (ACUTE ONLY): 16 min  Charges:                            Ariana Jacobs, SPT    Ariana Jacobs 06/08/2023, 2:01 PM

## 2023-06-08 NOTE — Progress Notes (Signed)
  Progress Note   Patient: Ariana Jacobs GEX:[id number] DOB: September 12, 1965 DOA: 06/05/2023     3 DOS: the patient was seen and examined on 06/08/2023   Brief hospital course: From admission to ICU:  "57 year old female with a history of uncontrolled hypertension, diabetes who came into the ED reporting worsening weakness over the last couple of months and progressively worse over the last week. She also reports headache that started to get worse over the last few days. She reports noting lower extremity weakness and difficulty with ambulation over the last few weeks also reporting paresthesias and numbness of lower extremities. She was evaluated in the ED for acute CVA and was found to have hemorrhagic stroke on CT head. Neurology had evaluated patient with recommendations for blood pressure control utilizing nicardipine as well as avoidance of any anticoagulation. PCCM admission for acute hemorrhagic CVA with frequent neurochecks. "   TRH team assumed care on 06/07/2023. Neurology following.  10/2 -- pt has ongoing headaches with photophobia. Neurology started compazine and benadryl.  10/3 - no headache. BP uncontrolled this AM, titrated BP meds and insulin regimen today   Assessment and Plan: * Hemorrhagic stroke [facility]) Neurology following - appreciate recs --BP control with PRN's for SBP > 160 --Mgmt as outlined below   Uncontrolled hypertension 10/3 - required IV labetolol this AM for BP 180/86 Initially on nicardipine drip in ICU. --Maintain SBP < 160 given hemorrhagic stroke. --Increase amlodipine 5 >> 10 mg --Continue lisinopril 20 mg, hydrochlorothiazide 25 mg --PRN IV labetalol if SBP > 160  Type 2 diabetes mellitus with hyperglycemia (HCC) Hbg A1c 9.8% - poorly controlled. --Increase Semglee 12 >> 14 units at bedtime,  --Add Novolog 4 units TID WC plus SSI --Adjust insulin for goal 140-180 --Appreciate diabetes coordinator input --PCP follow up  Acute nonintractable  headache Mgmt per Neurology - appreciate recs: Compazine & benadryl & Depakote        Subjective: Pt seen in ICU this AM, up in recliner.  She denies headache today.  Feels tired in general but otherwise feeling much better.  No other acute complaints.    Physical Exam: Vitals:   06/08/23 1200 06/08/23 1300 06/08/23 1400 06/08/23 1501  BP: 124/71 116/68 136/69 135/80  Pulse: 85   88  Resp: 18   16  Temp: 98.6 F (37 C)   98.3 F (36.8 C)  TempSrc: Oral   Oral  SpO2: 96%   99%  Weight:      Height:       General exam: awake, alert, no acute distress HEENT: moist mucus membranes, hearing grossly normal  Respiratory system: on room air, normal respiratory effort. Cardiovascular system: RRR, no pedal edema, pedal pulses intact.   Gastrointestinal system: soft, NT, ND, no HSM felt, +bowel sounds. Central nervous system: A&O x 3. normal speech, grossly nonfocal exam Extremities: moves all, no edema, normal tone Skin: dry, intact, normal temperature Psychiatry: normal mood, congruent affect   Data Reviewed:  Notable labs ---  Cl 97, K 3.5 low normal, glucose 200  CBG's 190>>301 >> 219  Echo EF 60-65%, grade I diastolic dysfunction, mild-mod TR  Family Communication: None present  Disposition: Status is: Inpatient Remains inpatient appropriate because: Closely monitoring BP and adjusting regimen, BP uncontrolled requiring IV PRN antihypertensive this AM    Planned Discharge Destination: Home    Time spent: 35 minutes  Author: Pennie Banter, DO 06/08/2023 3:40 PM  For on call review [email or web address].

## 2023-06-09 ENCOUNTER — Encounter: Payer: Self-pay | Admitting: Internal Medicine

## 2023-06-09 DIAGNOSIS — I619 Nontraumatic intracerebral hemorrhage, unspecified: Secondary | ICD-10-CM | POA: Diagnosis not present

## 2023-06-09 LAB — BASIC METABOLIC PANEL
Anion gap: 9 (ref 5–15)
BUN: 22 mg/dL — ABNORMAL HIGH (ref 6–20)
CO2: 30 mmol/L (ref 22–32)
Calcium: 9.7 mg/dL (ref 8.9–10.3)
Chloride: 95 mmol/L — ABNORMAL LOW (ref 98–111)
Creatinine, Ser: 0.63 mg/dL (ref 0.44–1.00)
GFR, Estimated: >60 mL/min (ref >60)
Glucose, Bld: 229 mg/dL — ABNORMAL HIGH (ref 70–99)
Potassium: 3.8 mmol/L (ref 3.5–5.1)
Sodium: 134 mmol/L — ABNORMAL LOW (ref 135–145)

## 2023-06-09 LAB — GLUCOSE, CAPILLARY: Glucose-Capillary: 220 mg/dL — ABNORMAL HIGH (ref 70–99)

## 2023-06-09 MED ORDER — BLOOD GLUCOSE TEST VI STRP: 1.0000 | ORAL_STRIP | Freq: Three times a day (TID) | 0 refills | Status: AC

## 2023-06-09 MED ORDER — LANCETS MISC. MISC: 1.0000 | Freq: Three times a day (TID) | 0 refills | Status: AC

## 2023-06-09 MED ORDER — BLOOD GLUCOSE MONITORING SUPPL DEVI: 1.0000 | Freq: Three times a day (TID) | 0 refills | Status: AC

## 2023-06-09 MED ORDER — LANCET DEVICE MISC: 1.0000 | Freq: Three times a day (TID) | 0 refills | Status: AC

## 2023-06-09 MED ORDER — FLEET ENEMA RE ENEM
1.0000 | ENEMA | Freq: Once | RECTAL | Status: AC
  Administered 2023-06-09: 1 via RECTAL

## 2023-06-09 MED ORDER — LISINOPRIL-HYDROCHLOROTHIAZIDE 20-25 MG PO TABS: 1.0000 | ORAL_TABLET | Freq: Every day | ORAL | 2 refills | Status: DC

## 2023-06-09 NOTE — Plan of Care (Signed)
Care plan complete

## 2023-06-09 NOTE — Discharge Summary (Addendum)
Physician Discharge Summary   Patient: Ariana Jacobs MRN: [id number] DOB: 08-08-1966  Admit date:     06/05/2023  Discharge date: 06/09/2023  Discharge Physician: Pennie Banter   PCP: No primary care provider on file.   Recommendations at discharge:    Follow up with Primary Care Repeat labs - CBC, BMP, Mg at follow up / as needed Follow up on BP control, strict goal systolic BP < 160  Discharge Diagnoses: Principal Problem:   Hemorrhagic stroke [facility]) Active Problems:   Acute nonintractable headache   Type 2 diabetes mellitus with hyperglycemia (HCC)   Uncontrolled hypertension  Resolved Problems:   Hypertensive emergency  Hospital Course:  From admission to ICU:  "57 year old female with a history of uncontrolled hypertension, diabetes who came into the ED reporting worsening weakness over the last couple of months and progressively worse over the last week. She also reports headache that started to get worse over the last few days. She reports noting lower extremity weakness and difficulty with ambulation over the last few weeks also reporting paresthesias and numbness of lower extremities. She was evaluated in the ED for acute CVA and was found to have hemorrhagic stroke on CT head. Neurology had evaluated patient with recommendations for blood pressure control utilizing nicardipine as well as avoidance of any anticoagulation. PCCM admission for acute hemorrhagic CVA with frequent neurochecks. "     TRH team assumed care on 06/07/2023. Neurology following.  Further hospital course and management as outlined below.    10/2 -- pt has ongoing headaches with photophobia. Neurology started compazine and benadryl.   10/3 - no headache. BP uncontrolled this AM, titrated BP meds and insulin regimen today  10/4 -- pt feels well, denies complaints.  BP controlled without need for IV PRN's.   Assessment and Plan:  * Hemorrhagic stroke [facility]) Neurology following - appreciate  recs --BP control with goal SBP < 160 --Mgmt as outlined below   Findings of mild surrounding edema not felt to be clinically significant   Uncontrolled hypertension HTN Emergency - POA > resolved Initially on nicardipine drip in ICU. --Maintain SBP < 160 given hemorrhagic stroke, per neurology recommendations. --Increased amlodipine 5 >> 10 mg --Continue lisinopril 20 mg, hydrochlorothiazide 25 mg  Type 2 diabetes mellitus with hyperglycemia (HCC) Hbg A1c 9.8% - poorly controlled. --Managed with basal and short-acting insulin --Home oral agents were held - resume at d/c  --Appreciate diabetes coordinator input --PCP follow up  Acute nonintractable headache Mgmt per Neurology - appreciate recs: Headache resolved with compazine & benadryl & depakote         Consultants: Neurology Procedures performed: Echo - see report  Disposition: Home Diet recommendation:  Discharge Diet Orders (From admission, onward)     Start     Ordered   06/09/23 0000  Diet - carb modified & heart healthy        06/09/23 1311            DISCHARGE MEDICATION: Allergies as of 06/09/2023   No Known Allergies      Medication List     STOP taking these medications    fluconazole 150 MG tablet Commonly known as: Diflucan   ipratropium 0.06 % nasal spray Commonly known as: ATROVENT   lisinopril-hydrochlorothiazide 20-25 MG tablet Commonly known as: ZESTORETIC       TAKE these medications    atorvastatin 40 MG tablet Commonly known as: LIPITOR Take 40 mg by mouth at bedtime.   Blood Glucose  Monitoring Suppl Devi 1 each by Does not apply route in the morning, at noon, and at bedtime. May substitute to any manufacturer covered by patient's insurance.   BLOOD GLUCOSE TEST STRIPS Strp 1 each by In Vitro route in the morning, at noon, and at bedtime. May substitute to any manufacturer covered by patient's insurance.   Jardiance 25 MG Tabs tablet Generic drug:  empagliflozin Take 25 mg by mouth daily.   Lancet Device Misc 1 each by Does not apply route in the morning, at noon, and at bedtime. May substitute to any manufacturer covered by patient's insurance.   Lancets Misc. Misc 1 each by Does not apply route in the morning, at noon, and at bedtime. May substitute to any manufacturer covered by patient's insurance.   metFORMIN 500 MG 24 hr tablet Commonly known as: GLUCOPHAGE-XR Take 1,000 mg by mouth 2 (two) times daily.        Discharge Exam: Filed Weights   06/06/23 0500 06/07/23 0500 06/08/23 0500  Weight: 50.2 kg 51.1 kg 51.1 kg   General exam: awake, alert, no acute distress Respiratory system: CTAB, no wheezes, rales or rhonchi, normal respiratory effort. Cardiovascular system: normal S1/S2, RRR, no JVD, murmurs, rubs, gallops,  no pedal edema.   Gastrointestinal system: soft, NT, ND, no HSM felt, +bowel sounds. Central nervous system: A&O x 3. no gross focal neurologic deficits, normal speech Extremities: moves all, no edema, normal tone Skin: dry, intact, normal temperature, normal color, No rashes, lesions or ulcers Psychiatry: normal mood, congruent affect, judgement and insight appear normal   Condition at discharge: stable  The results of significant diagnostics from this hospitalization (including imaging, microbiology, ancillary and laboratory) are listed below for reference.   Imaging Studies: MR BRAIN W WO CONTRAST  Result Date: 06/15/2023 CLINICAL DATA:  Initial evaluation for acute headache. EXAM: MRI HEAD WITHOUT AND WITH CONTRAST TECHNIQUE: Multiplanar, multiecho pulse sequences of the brain and surrounding structures were obtained without and with intravenous contrast. CONTRAST:  5mL GADAVIST GADOBUTROL 1 MMOL/ML IV SOLN COMPARISON:  Prior CT from 06/14/2023 as well as earlier studies. FINDINGS: Brain: Cerebral volume within normal limits. Scattered subcentimeter foci of T2/FLAIR hyperintensity involving the  supratentorial cerebral white matter, nonspecific, but most commonly related to chronic microvascular ischemic disease. Changes are mild for age. Previously identified hematoma positioned at the right caudate head/right lateral ventricle again seen, now measuring 8 x 8 x 8 mm (series 11, image 29). Minimal surrounding edema without significant regional mass effect or significant midline shift. Trace intraventricular extension with small amount of blood seen layering within the occipital horns of the lateral ventricles. No hydrocephalus. No visible underlying mass or abnormal enhancement. No other acute intracranial hemorrhage few additional punctate chronic micro hemorrhages noted elsewhere, nonspecific. No acute or subacute infarct. No areas of chronic cortical infarction. No other mass lesion or mass effect. No extra-axial fluid collection. Pituitary gland is somewhat irregular and lobulated with inferior extension into the clivus (series 9, image 14). Suprasellar region within normal limits. No other abnormal enhancement. Vascular: Major intracranial vascular flow voids are maintained. Skull and upper cervical spine: Craniocervical junction within normal limits. Bone marrow signal intensity normal. No scalp soft tissue abnormality. Sinuses/Orbits: Globes orbital soft tissues within normal limits. Paranasal sinuses are largely clear. Trace bilateral mastoid effusions, of doubtful significance. Other: Sequelae of prior right parotidectomy, partially visualized. IMPRESSION: 1. 8 x 8 x 8 mm hematoma centered at the right caudate head/right lateral ventricle, with trace intraventricular extension. No hydrocephalus.  No visible underlying mass or abnormal enhancement. Continued CT follow-up to resolution recommended. 2. Irregular and lobulated pituitary gland with inferior extension into the clivus, indeterminate. Correlation with pituitary function tests suggested. Additionally, follow-up examination with nonemergent  pituitary protocol mass MRI could be performed for further evaluation as warranted. 3. No other acute intracranial abnormality. 4. Underlying mild cerebral white matter disease, nonspecific, but most commonly related to chronic microvascular ischemic disease. Electronically Signed   By: Rise Mu M.D.   On: 06/15/2023 03:26   CT ABDOMEN PELVIS W CONTRAST  Result Date: 06/14/2023 CLINICAL DATA:  Acute nonlocalized abdominal pain EXAM: CT ABDOMEN AND PELVIS WITH CONTRAST TECHNIQUE: Multidetector CT imaging of the abdomen and pelvis was performed using the standard protocol following bolus administration of intravenous contrast. RADIATION DOSE REDUCTION: This exam was performed according to the departmental dose-optimization program which includes automated exposure control, adjustment of the mA and/or kV according to patient size and/or use of iterative reconstruction technique. CONTRAST:  75mL OMNIPAQUE IOHEXOL 300 MG/ML  SOLN COMPARISON:  None Available. FINDINGS: Lower chest: No acute abnormality. Hepatobiliary: No focal liver abnormality is seen. No gallstones, gallbladder wall thickening, or biliary dilatation. Pancreas: Unremarkable Spleen: Unremarkable Adrenals/Urinary Tract: The adrenal glands are unremarkable. The kidneys are normal in size and position. Cortical hypodensities within the kidneys bilaterally are not well characterized due to motion artifact, however, these appear grossly stable since prior examination and were compatible with simple cortical cysts at that time. No follow-up imaging is recommended for these lesions. The kidneys are otherwise unremarkable. Bladder is unremarkable. Stomach/Bowel: Stomach is within normal limits. The appendix is not clearly identified, however, no secondary signs of appendicitis are identified within the right quadrant. No evidence of bowel wall thickening, distention, or inflammatory changes. Vascular/Lymphatic: No significant vascular findings are  present. No enlarged abdominal or pelvic lymph nodes. Reproductive: Uterus and bilateral adnexa are unremarkable. Other: No abdominal wall hernia or abnormality. No abdominopelvic ascites. Musculoskeletal: No acute or significant osseous findings. IMPRESSION: 1. No acute intra-abdominal pathology identified. No definite radiographic explanation for the patient's reported symptoms. Electronically Signed   By: Helyn Numbers M.D.   On: 06/14/2023 22:48   CT Head Wo Contrast  Result Date: 06/14/2023 CLINICAL DATA:  Recent intracranial hemorrhage, progressive headache EXAM: CT HEAD WITHOUT CONTRAST TECHNIQUE: Contiguous axial images were obtained from the base of the skull through the vertex without intravenous contrast. RADIATION DOSE REDUCTION: This exam was performed according to the departmental dose-optimization program which includes automated exposure control, adjustment of the mA and/or kV according to patient size and/or use of iterative reconstruction technique. COMPARISON:  None Available. FINDINGS: Brain: There is been interval evolution of the intraparenchymal hematoma within the right caudate head which demonstrates decreasing hyperdensity, decreasing surrounding cytotoxic edema, and now measures roughly 6 x 18 mm in size. Previously noted intraventricular hemorrhage has resolved. Minimal associated mass effect related to the intraparenchymal hematoma without associated midline shift. No interval hemorrhage. Ventricular size is normal. Cerebellum is unremarkable. Vascular: No hyperdense vessel or unexpected calcification. Skull: Normal. Negative for fracture or focal lesion. Sinuses/Orbits: No acute finding. Other: Mastoid air cells and middle ear cavities are clear. IMPRESSION: 1. Interval evolution of intraparenchymal hematoma within the right caudate head with decreasing hyperdensity, decreasing surrounding cytotoxic edema, and resolution of intraventricular hemorrhage. Minimal associated mass  effect without associated midline shift. No interval hemorrhage. Electronically Signed   By: Helyn Numbers M.D.   On: 06/14/2023 22:41   DG Chest [facility]  Result Date: 06/14/2023 CLINICAL DATA:  Weakness. EXAM: PORTABLE CHEST 1 VIEW COMPARISON:  Radiograph 06/27/2019 FINDINGS: The cardiomediastinal contours are normal. The lungs are clear. Pulmonary vasculature is normal. No consolidation, pleural effusion, or pneumothorax. No acute osseous abnormalities are seen. IMPRESSION: No acute chest findings. Electronically Signed   By: Narda Rutherford M.D.   On: 06/14/2023 22:34   ECHOCARDIOGRAM COMPLETE  Result Date: 06/07/2023    ECHOCARDIOGRAM REPORT   Patient Name:   JAMILETH FEULNER Date of Exam: 06/07/2023 Medical Rec #:  [id number]        Height:       59.0 in Accession #:    [id number]       Weight:       112.7 lb Date of Birth:  [birth year]       BSA:          1.445 m Patient Age:    56 years         BP:           154/80 mmHg Patient Gender: F                HR:           92 bpm. Exam Location:  [facility] Procedure: 2D Echo, Cardiac Doppler and Color Doppler Indications:     Stroke I63.9  History:         Patient has no prior history of Echocardiogram examinations.                  Risk Factors:Diabetes and Hypertension.  Sonographer:     Lucendia Herrlich RCS Referring Phys:  [phone] MCNEILL P KIRKPATRICK Diagnosing Phys: Julien Nordmann MD IMPRESSIONS  1. Left ventricular ejection fraction, by estimation, is 60 to 65%. The left ventricle has normal function. The left ventricle has no regional wall motion abnormalities. There is moderate left ventricular hypertrophy. Left ventricular diastolic parameters are consistent with Grade I diastolic dysfunction (impaired relaxation).  2. Right ventricular systolic function is normal. The right ventricular size is normal. There is normal pulmonary artery systolic pressure. The estimated right ventricular systolic pressure is 34.4 mmHg.  3. The mitral valve is normal in  structure. No evidence of mitral valve regurgitation. No evidence of mitral stenosis.  4. Tricuspid valve regurgitation is mild to moderate.  5. The aortic valve is tricuspid. Aortic valve regurgitation is not visualized. No aortic stenosis is present.  6. The inferior vena cava is normal in size with greater than 50% respiratory variability, suggesting right atrial pressure of 3 mmHg. FINDINGS  Left Ventricle: Left ventricular ejection fraction, by estimation, is 60 to 65%. The left ventricle has normal function. The left ventricle has no regional wall motion abnormalities. The left ventricular internal cavity size was normal in size. There is  moderate left ventricular hypertrophy. Left ventricular diastolic parameters are consistent with Grade I diastolic dysfunction (impaired relaxation). Right Ventricle: The right ventricular size is normal. No increase in right ventricular wall thickness. Right ventricular systolic function is normal. There is normal pulmonary artery systolic pressure. The tricuspid regurgitant velocity is 2.71 m/s, and  with an assumed right atrial pressure of 5 mmHg, the estimated right ventricular systolic pressure is 34.4 mmHg. Left Atrium: Left atrial size was normal in size. Right Atrium: Right atrial size was normal in size. Pericardium: There is no evidence of pericardial effusion. Mitral Valve: The mitral valve is normal in structure. No evidence of mitral valve regurgitation. No evidence of mitral valve stenosis. Tricuspid  Valve: The tricuspid valve is normal in structure. Tricuspid valve regurgitation is mild to moderate. No evidence of tricuspid stenosis. Aortic Valve: The aortic valve is tricuspid. Aortic valve regurgitation is not visualized. No aortic stenosis is present. Aortic valve peak gradient measures 6.9 mmHg. Pulmonic Valve: The pulmonic valve was normal in structure. Pulmonic valve regurgitation is not visualized. No evidence of pulmonic stenosis. Aorta: The aortic root  is normal in size and structure. Venous: The inferior vena cava is normal in size with greater than 50% respiratory variability, suggesting right atrial pressure of 3 mmHg. IAS/Shunts: No atrial level shunt detected by color flow Doppler.  LEFT VENTRICLE PLAX 2D LVIDd:         4.60 cm   Diastology LVIDs:         2.70 cm   LV e' medial:    7.40 cm/s LV PW:         0.80 cm   LV E/e' medial:  9.8 LV IVS:        0.80 cm   LV e' lateral:   10.20 cm/s LVOT diam:     2.00 cm   LV E/e' lateral: 7.1 LV SV:         50 LV SV Index:   35 LVOT Area:     3.14 cm  RIGHT VENTRICLE             IVC RV S prime:     15.30 cm/s  IVC diam: 0.60 cm TAPSE (M-mode): 1.7 cm LEFT ATRIUM             Index        RIGHT ATRIUM          Index LA diam:        2.90 cm 2.01 cm/m   RA Area:     9.07 cm LA Vol (A2C):   24.6 ml 17.02 ml/m  RA Volume:   17.40 ml 12.04 ml/m LA Vol (A4C):   37.1 ml 25.70 ml/m LA Biplane Vol: 30.5 ml 21.11 ml/m  AORTIC VALVE                 PULMONIC VALVE AV Area (Vmax): 2.29 cm     PR End Diast Vel: 5.66 msec AV Vmax:        131.00 cm/s AV Peak Grad:   6.9 mmHg LVOT Vmax:      95.30 cm/s LVOT Vmean:     61.600 cm/s LVOT VTI:       0.159 m  AORTA Ao Root diam: 3.00 cm Ao Asc diam:  3.10 cm MITRAL VALVE               TRICUSPID VALVE MV Area (PHT): 4.71 cm    TR Peak grad:   29.4 mmHg MV Decel Time: 161 msec    TR Vmax:        271.00 cm/s MV E velocity: 72.60 cm/s MV A velocity: 90.70 cm/s  SHUNTS MV E/A ratio:  0.80        Systemic VTI:  0.16 m                            Systemic Diam: 2.00 cm Julien Nordmann MD Electronically signed by Julien Nordmann MD Signature Date/Time: 06/07/2023/2:15:50 PM    Final    CT HEAD WO CONTRAST ( )  Result Date: 06/07/2023 CLINICAL DATA:  hemorrhage follow-up EXAM: CT HEAD WITHOUT CONTRAST TECHNIQUE: Contiguous axial images  were obtained from the base of the skull through the vertex without intravenous contrast. RADIATION DOSE REDUCTION: This exam was performed according to the  departmental dose-optimization program which includes automated exposure control, adjustment of the mA and/or kV according to patient size and/or use of iterative reconstruction technique. COMPARISON:  06/06/2023 at 7:46 a.m. FINDINGS: Brain: Redemonstration of intraventricular hemorrhage with small amount of blood in the right lateral ventricle, third ventricle, both occipital horns and the fourth ventricle. No new site of hemorrhage. No midline shift or other mass effect. There is periventricular hypoattenuation compatible with chronic microvascular disease. Unchanged size and configuration of the ventricles without hydrocephalus. The intraparenchymal hemorrhage component in the right caudate head is unchanged. Vascular: No hyperdense vessel or unexpected calcification. Skull: Normal. Negative for fracture or focal lesion. Sinuses/Orbits: No acute finding. Other: None IMPRESSION: 1. Unchanged intraventricular hemorrhage without hydrocephalus. 2. Unchanged intraparenchymal hemorrhage component in the right caudate head. Electronically Signed   By: Deatra Chard M.D.   On: 06/07/2023 00:01   CT HEAD WO CONTRAST ( )  Result Date: 06/06/2023 CLINICAL DATA:  Stroke follow-up. Severe headache beginning today with nausea. EXAM: CT HEAD WITHOUT CONTRAST TECHNIQUE: Contiguous axial images were obtained from the base of the skull through the vertex without intravenous contrast. RADIATION DOSE REDUCTION: This exam was performed according to the departmental dose-optimization program which includes automated exposure control, adjustment of the mA and/or kV according to patient size and/or use of iterative reconstruction technique. COMPARISON:  Yesterday FINDINGS: Brain: Unchanged extent of intraventricular blood clot in the right more than left lateral ventricle and in the inferior third ventricular recesses. As before, hemorrhage could have arised from the right caudate nucleus, although precise site difficult to  establish on reformats. No ventriculomegaly, infarct, mass, or shift. Few sulci visible but stable and likely from normal brain volume. Minor periventricular low-density that is stable. Vascular: No hyperdense vessel or unexpected calcification. Skull: Normal. Negative for fracture or focal lesion. Sinuses/Orbits: No acute finding. IMPRESSION: Unchanged intraventricular hemorrhage without hydrocephalus. Electronically Signed   By: Tiburcio Pea M.D.   On: 06/06/2023 09:06    Microbiology: Results for orders placed or performed during the hospital encounter of 06/05/23  MRSA Next Gen by PCR, Nasal     Status: None   Collection Time: 06/05/23  2:34 PM   Specimen: Nasal Mucosa; Nasal Swab  Result Value Ref Range Status   MRSA by PCR Next Gen NOT DETECTED NOT DETECTED Final    Comment: (NOTE) The GeneXpert MRSA Assay (FDA approved for NASAL specimens only), is one component of a comprehensive MRSA colonization surveillance program. It is not intended to diagnose MRSA infection nor to guide or monitor treatment for MRSA infections. Test performance is not FDA approved in patients less than 45 years old. Performed at [facility], [address] [place], [place] [address]     Labs: CBC: No results for input(s): "WBC", "NEUTROABS", "HGB", "HCT", "MCV", "PLT" in the last 168 hours.  Basic Metabolic Panel: No results for input(s): "NA", "K", "CL", "CO2", "GLUCOSE", "BUN", "CREATININE", "CALCIUM", "MG", "PHOS" in the last 168 hours.  Liver Function Tests: No results for input(s): "AST", "ALT", "ALKPHOS", "BILITOT", "PROT", "ALBUMIN" in the last 168 hours.  CBG: No results for input(s): "GLUCAP" in the last 168 hours.   Discharge time spent: less than 30 minutes.  Signed: Pennie Banter, DO [facility] 07/05/2023

## 2023-06-09 NOTE — Inpatient Diabetes Management (Signed)
Inpatient Diabetes Program Recommendations  AACE/ADA: New Consensus Statement on Inpatient Glycemic Control (2015)  Target Ranges:  Prepandial:   less than 140 mg/dL      Peak postprandial:   less than 180 mg/dL (1-2 hours)      Critically ill patients:  140 - 180 mg/dL    Latest Reference Range & Units 06/08/23 07:51 06/08/23 11:36 06/08/23 15:06 06/08/23 21:19  Glucose-Capillary 70 - 99 mg/dL 161 (H) 096 (H) 045 (H) 200 (H)  (H): Data is abnormally high  Latest Reference Range & Units 06/09/23 12:34  Glucose-Capillary 70 - 99 mg/dL 409 (H)  (H): Data is abnormally high  Admit with: Hemorrhagic stroke/ Uncontrolled hypertension    History: DM   Home DM Meds: Lantus 24 units at HS                             Jardiance 25 mg daily                             Metformin 1000 mg BID   Current Orders: Semglee 14 units at bedtime                           Novolog Sensitive Correction Scale/ SSI (0-9 units) TID AC + HS      Novolog 4 units TID with meals    Met w/ pt at bedside this AM.  She was curled up in her bed resting but woke up and answered all my questions.  Pt states she takes the Lantus 24 units at HS + the Jardiance and Metformin.  She is unsure if she has CBG meter at home--I asked pt if she has been checking CBGs at all and pt stated "No".  Sees Dr. Carlynn Purl for PCP needs and Rx refills.  Spoke with patient about her current A1c of 9.8%.  Explained what an A1c is and what it measures.  Reminded patient that her goal A1c is 7% or less per ADA standards to prevent both acute and long-term complications, especially to help prevent further CVAs.  Explained to patient the extreme importance of good glucose control at home.  Encouraged patient to check her CBGs at least TID AC at home and to record all CBGs in a logbook for her PCP to review.   --Will follow patient during hospitalization--  Ambrose Finland RN, MSN, CDCES Diabetes Coordinator Inpatient Glycemic Control  Team Team Pager: [phone] (8a-5p)

## 2023-06-09 NOTE — Plan of Care (Signed)
  Problem: Education: Goal: Knowledge of General Education information will improve Description: Including pain rating scale, medication(s)/side effects and non-pharmacologic comfort measures Outcome: Progressing   Problem: Health Behavior/Discharge Planning: Goal: Ability to manage health-related needs will improve Outcome: Progressing   Problem: Clinical Measurements: Goal: Ability to maintain clinical measurements within normal limits will improve Outcome: Progressing Goal: Will remain free from infection Outcome: Progressing Goal: Diagnostic test results will improve Outcome: Progressing Goal: Respiratory complications will improve Outcome: Progressing Goal: Cardiovascular complication will be avoided Outcome: Progressing   Problem: Activity: Goal: Risk for activity intolerance will decrease Outcome: Progressing   Problem: Nutrition: Goal: Adequate nutrition will be maintained Outcome: Progressing   Problem: Coping: Goal: Level of anxiety will decrease Outcome: Progressing   Problem: Elimination: Goal: Will not experience complications related to bowel motility Outcome: Progressing Goal: Will not experience complications related to urinary retention Outcome: Progressing   Problem: Pain Managment: Goal: General experience of comfort will improve Outcome: Progressing   Problem: Safety: Goal: Ability to remain free from injury will improve Outcome: Progressing   Problem: Skin Integrity: Goal: Risk for impaired skin integrity will decrease Outcome: Progressing   Problem: Education: Goal: Knowledge of disease or condition will improve Outcome: Progressing Goal: Knowledge of secondary prevention will improve (MUST DOCUMENT ALL) Outcome: Progressing Goal: Knowledge of patient specific risk factors will improve Loraine Leriche N/A or DELETE if not current risk factor) Outcome: Progressing   Problem: Intracerebral Hemorrhage Tissue Perfusion: Goal: Complications of  Intracerebral Hemorrhage will be minimized Outcome: Progressing   Problem: Coping: Goal: Will verbalize positive feelings about self Outcome: Progressing Goal: Will identify appropriate support needs Outcome: Progressing   Problem: Health Behavior/Discharge Planning: Goal: Ability to manage health-related needs will improve Outcome: Progressing Goal: Goals will be collaboratively established with patient/family Outcome: Progressing   Problem: Self-Care: Goal: Ability to participate in self-care as condition permits will improve Outcome: Progressing   Problem: Nutrition: Goal: Dietary intake will improve Outcome: Progressing   Problem: Education: Goal: Ability to describe self-care measures that may prevent or decrease complications (Diabetes Survival Skills Education) will improve Outcome: Progressing Goal: Individualized Educational Video(s) Outcome: Progressing   Problem: Coping: Goal: Ability to adjust to condition or change in health will improve Outcome: Progressing   Problem: Fluid Volume: Goal: Ability to maintain a balanced intake and output will improve Outcome: Progressing   Problem: Health Behavior/Discharge Planning: Goal: Ability to identify and utilize available resources and services will improve Outcome: Progressing Goal: Ability to manage health-related needs will improve Outcome: Progressing   Problem: Metabolic: Goal: Ability to maintain appropriate glucose levels will improve Outcome: Progressing   Problem: Nutritional: Goal: Maintenance of adequate nutrition will improve Outcome: Progressing Goal: Progress toward achieving an optimal weight will improve Outcome: Progressing   Problem: Skin Integrity: Goal: Risk for impaired skin integrity will decrease Outcome: Progressing   Problem: Tissue Perfusion: Goal: Adequacy of tissue perfusion will improve Outcome: Progressing

## 2023-06-09 NOTE — TOC Transition Note (Signed)
Transition of Care [facility]) - CM/SW Discharge Note   Patient Details  Name: Ariana Jacobs MRN: [id number] Date of Birth: 05-Dec-1965  Transition of Care [facility]) CM/SW Contact:  Truddie Hidden, RN Phone Number: 06/09/2023, 2:08 PM   Clinical Narrative:    Spoke with patient to advised [facility] had accepted her referral.    Final next level of care: Home w Home Health Services Barriers to Discharge: Barriers Resolved   Patient Goals and CMS Choice      Discharge Placement                         Discharge Plan and Services Additional resources added to the After Visit Summary for                  DME Arranged: Walker rolling DME Agency: [employer] Date DME Agency Contacted: 06/09/23 Time DME Agency Contacted: 1407 Representative spoke with at DME Agency: Cletis Athens HH Arranged: PT, OT [facility] Agency: [facility] Date [facility] Agency Contacted: 06/09/23 Time HH Agency Contacted: 1407 Representative spoke with at [facility] Agency: Elnita Maxwell  Social Determinants of Health (SDOH) Interventions SDOH Screenings   Food Insecurity: No Food Insecurity (06/05/2023)  Housing: Low Risk  (06/05/2023)  Transportation Needs: No Transportation Needs (06/05/2023)  Utilities: Not At Risk (06/05/2023)  Tobacco Use: Low Risk  (06/05/2023)     Readmission Risk Interventions     No data to display

## 2023-06-09 NOTE — Progress Notes (Signed)
Pt complains of constipation. Enema given. No results.  Prn mirilax given.. prune juice given.

## 2023-06-09 NOTE — Progress Notes (Signed)
Reviewed d/c instructions with patient including medications and f/u appointments.

## 2023-06-09 NOTE — TOC Initial Note (Signed)
Transition of Care [facility]) - Initial/Assessment Note    Patient Details  Name: Ariana Jacobs MRN: [id number] Date of Birth: 1965/10/07  Transition of Care [facility]) CM/SW Contact:    Truddie Hidden, RN Phone Number: 06/09/2023, 9:54 AM  Clinical Narrative:                 Spoke with patient regarding therapy's recommendation for HHPT. Patient is agreeable to home health. She has been advised the accepting [facility] agency would contact her directly to schedule an appointment for [facility]. Patient stated she will call her a ride at discharge.   Referral sent and declined by [facility] [place].   Refferal sent to [facility] from [facility].          Patient Goals and CMS Choice            Expected Discharge Plan and Services                                              Prior Living Arrangements/Services                       Activities of Daily Living   ADL Screening (condition at time of admission) Does the patient have a NEW difficulty with bathing/dressing/toileting/self-feeding that is expected to last >3 days?: No Does the patient have a NEW difficulty with getting in/out of bed, walking, or climbing stairs that is expected to last >3 days?: No Does the patient have a NEW difficulty with communication that is expected to last >3 days?: No Is the patient deaf or have difficulty hearing?: No Does the patient have difficulty seeing, even when wearing glasses/contacts?: No Does the patient have difficulty concentrating, remembering, or making decisions?: No  Permission Sought/Granted                  Emotional Assessment              Admission diagnosis:  Intracranial hemorrhage (HCC) [I62.9] Hemorrhagic stroke [facility]) [I61.9] Patient Active Problem List   Diagnosis Date Noted   Type 2 diabetes mellitus with hyperglycemia (HCC) 06/07/2023   Uncontrolled hypertension 06/07/2023   Hemorrhagic stroke (HCC) 06/05/2023   Acute nonintractable headache  06/04/2023   Acute right-sided low back pain with right-sided sciatica 06/04/2023   PCP:  Alba Cory, MD Pharmacy:   [facility] [address], [place] - [address] [place] [address] Phone: [phone] Fax: [phone]     Social Determinants of Health (SDOH) Social History: SDOH Screenings   Food Insecurity: No Food Insecurity (06/05/2023)  Housing: Low Risk  (06/05/2023)  Transportation Needs: No Transportation Needs (06/05/2023)  Utilities: Not At Risk (06/05/2023)  Tobacco Use: Low Risk  (06/05/2023)   SDOH Interventions:     Readmission Risk Interventions     No data to display

## 2023-06-09 NOTE — Plan of Care (Signed)
progressing 

## 2023-06-14 ENCOUNTER — Observation Stay
Admission: EM | Admit: 2023-06-14 | Discharge: 2023-06-17 | Disposition: A | Discharge status: Home Health | Payer: 59 | Attending: Student in an Organized Health Care Education/Training Program | Admitting: Student in an Organized Health Care Education/Training Program

## 2023-06-14 ENCOUNTER — Emergency Department: Payer: 59

## 2023-06-14 ENCOUNTER — Other Ambulatory Visit: Payer: Self-pay

## 2023-06-14 ENCOUNTER — Encounter: Payer: Self-pay | Admitting: *Deleted

## 2023-06-14 DIAGNOSIS — I10 Essential (primary) hypertension: Secondary | ICD-10-CM | POA: Insufficient documentation

## 2023-06-14 DIAGNOSIS — A419 Sepsis, unspecified organism: Secondary | ICD-10-CM | POA: Diagnosis present

## 2023-06-14 DIAGNOSIS — R112 Nausea with vomiting, unspecified: Secondary | ICD-10-CM | POA: Diagnosis not present

## 2023-06-14 DIAGNOSIS — E1165 Type 2 diabetes mellitus with hyperglycemia: Secondary | ICD-10-CM | POA: Diagnosis not present

## 2023-06-14 DIAGNOSIS — R531 Weakness: Principal | ICD-10-CM

## 2023-06-14 DIAGNOSIS — N390 Urinary tract infection, site not specified: Secondary | ICD-10-CM | POA: Insufficient documentation

## 2023-06-14 DIAGNOSIS — R519 Headache, unspecified: Secondary | ICD-10-CM | POA: Diagnosis not present

## 2023-06-14 DIAGNOSIS — Z1152 Encounter for screening for COVID-19: Secondary | ICD-10-CM | POA: Diagnosis not present

## 2023-06-14 DIAGNOSIS — I619 Nontraumatic intracerebral hemorrhage, unspecified: Secondary | ICD-10-CM | POA: Diagnosis present

## 2023-06-14 DIAGNOSIS — R197 Diarrhea, unspecified: Secondary | ICD-10-CM | POA: Insufficient documentation

## 2023-06-14 LAB — BASIC METABOLIC PANEL
Anion gap: 14 (ref 5–15)
BUN: 28 mg/dL — ABNORMAL HIGH (ref 6–20)
CO2: 24 mmol/L (ref 22–32)
Calcium: 9.3 mg/dL (ref 8.9–10.3)
Chloride: 94 mmol/L — ABNORMAL LOW (ref 98–111)
Creatinine, Ser: 0.79 mg/dL (ref 0.44–1.00)
GFR, Estimated: >60 mL/min (ref >60)
Glucose, Bld: 352 mg/dL — ABNORMAL HIGH (ref 70–99)
Potassium: 4.2 mmol/L (ref 3.5–5.1)
Sodium: 132 mmol/L — ABNORMAL LOW (ref 135–145)

## 2023-06-14 LAB — CBC
HCT: 33.8 % — ABNORMAL LOW (ref 36.0–46.0)
Hemoglobin: 10.8 g/dL — ABNORMAL LOW (ref 12.0–15.0)
MCH: 29.7 pg (ref 26.0–34.0)
MCHC: 32 g/dL (ref 30.0–36.0)
MCV: 92.9 fL (ref 80.0–100.0)
Platelets: 295 10*3/uL (ref 150–400)
RBC: 3.64 MIL/uL — ABNORMAL LOW (ref 3.87–5.11)
RDW: 13.3 % (ref 11.5–15.5)
WBC: 11.8 10*3/uL — ABNORMAL HIGH (ref 4.0–10.5)
nRBC: 0 % (ref 0.0–0.2)

## 2023-06-14 LAB — HEPATIC FUNCTION PANEL
ALT: 18 U/L (ref 0–44)
AST: 16 U/L (ref 15–41)
Albumin: 3.9 g/dL (ref 3.5–5.0)
Alkaline Phosphatase: 87 U/L (ref 38–126)
Bilirubin, Direct: 0.1 mg/dL (ref 0.0–0.2)
Indirect Bilirubin: 0.7 mg/dL (ref 0.3–0.9)
Total Bilirubin: 0.8 mg/dL (ref 0.3–1.2)
Total Protein: 8.2 g/dL — ABNORMAL HIGH (ref 6.5–8.1)

## 2023-06-14 LAB — RESP PANEL BY RT-PCR (RSV, FLU A&B, COVID)  RVPGX2
Influenza A by PCR: NEGATIVE
Influenza B by PCR: NEGATIVE
Resp Syncytial Virus by PCR: NEGATIVE
SARS Coronavirus 2 by RT PCR: NEGATIVE

## 2023-06-14 LAB — LACTIC ACID, PLASMA
Lactic Acid, Venous: 2.2 mmol/L (ref 0.5–1.9)
Lactic Acid, Venous: 1 mmol/L (ref 0.5–1.9)

## 2023-06-14 LAB — TROPONIN I (HIGH SENSITIVITY)
Troponin I (High Sensitivity): 7 ng/L (ref <18)
Troponin I (High Sensitivity): 9 ng/L (ref <18)

## 2023-06-14 LAB — LIPASE, BLOOD: Lipase: 48 U/L (ref 11–51)

## 2023-06-14 LAB — CBG MONITORING, ED
Glucose-Capillary: 386 mg/dL — ABNORMAL HIGH (ref 70–99)
Glucose-Capillary: 308 mg/dL — ABNORMAL HIGH (ref 70–99)

## 2023-06-14 MED ORDER — IOHEXOL 300 MG/ML  SOLN
75.0000 mL | Freq: Once | INTRAMUSCULAR | Status: AC | PRN
  Administered 2023-06-14: 75 mL via INTRAVENOUS

## 2023-06-14 MED ORDER — SODIUM CHLORIDE 0.9 % IV BOLUS
1000.0000 mL | Freq: Once | INTRAVENOUS | Status: AC
  Administered 2023-06-14: 1000 mL via INTRAVENOUS

## 2023-06-14 NOTE — ED Triage Notes (Signed)
First nurse note "from clinic [facility]" she has weakness, headache, [facility]" CBG 395"  Pt states that she has been feeling unwell and generally weak since Sunday, no energy, no appetite.

## 2023-06-14 NOTE — ED Provider Notes (Signed)
[facility] Provider Note    Event Date/Time   First MD Initiated Contact with Patient 06/14/23 1955     (approximate)   History   Weakness   HPI  Ariana Jacobs is a 57 y.o. female with a history of uncontrolled hypertension and diabetes as well as recent ICH who presents with headache, generalized weakness, nausea, and diarrhea.  The patient was discharged from [facility] on 10/4 after presenting with a hemorrhagic CVA.  She states that since that time she has had persistent headaches mainly on the right side that have not acutely worsened since the hospitalization but also have not improved.  Over the last 1 to 2 days she has developed nausea but no vomiting.  She also has diarrhea.  She does not have any acute abdominal pain.  She reports generalized weakness and malaise as well.  I reviewed the past medical records.  I confirmed that the patient was admitted to the hospitalist service at [facility] last week and discharged on 10/4 after being treated for uncontrolled hypertension and a hemorrhagic CVA that did not or not require any neurosurgical intervention.   Physical Exam   Triage Vital Signs: ED Triage Vitals  Encounter Vitals Group     BP 06/14/23 1646 99/63     Systolic BP Percentile --      Diastolic BP Percentile --      Pulse Rate 06/14/23 1646 (!) 104     Resp 06/14/23 1646 18     Temp 06/14/23 1646 99.1 F (37.3 C)     Temp Source 06/14/23 1646 Oral     SpO2 06/14/23 1646 100 %     Weight --      Height --      Head Circumference --      Peak Flow --      Pain Score 06/14/23 1649 0     Pain Loc --      Pain Education --      Exclude from Growth Chart --     Most recent vital signs: Vitals:   06/14/23 2030 06/14/23 2048  BP: (!) 152/89   Pulse: (!) 107   Resp: (!) 22   Temp:  99.5 F (37.5 C)  SpO2: 94%      General: Alert and oriented, weak appearing but in no distress.  CV:  Good peripheral perfusion.   Resp:  Normal effort.  Lungs CTAB. Abd:  Soft with mild diffuse discomfort but no focal tenderness.  No distention.  Other:  EOMI.  PERRLA.  Mild photophobia.  No facial droop.  Normal speech.  Motor intact in all extremities.  Normal coordination.  Neck with full ROM, no meningeal signs.   ED Results / Procedures / Treatments   Labs (all labs ordered are listed, but only abnormal results are displayed) Labs Reviewed  CBC - Abnormal; Notable for the following components:      Result Value   WBC 11.8 (*)    RBC 3.64 (*)    Hemoglobin 10.8 (*)    HCT 33.8 (*)    All other components within normal limits  BASIC METABOLIC PANEL - Abnormal; Notable for the following components:   Sodium 132 (*)    Chloride 94 (*)    Glucose, Bld 352 (*)    BUN 28 (*)    All other components within normal limits  HEPATIC FUNCTION PANEL - Abnormal; Notable for the following components:   Total Protein 8.2 (*)  All other components within normal limits  LACTIC ACID, PLASMA - Abnormal; Notable for the following components:   Lactic Acid, Venous 2.2 (*)    All other components within normal limits  CBG MONITORING, ED - Abnormal; Notable for the following components:   Glucose-Capillary 386 (*)    All other components within normal limits  CBG MONITORING, ED - Abnormal; Notable for the following components:   Glucose-Capillary 308 (*)    All other components within normal limits  RESP PANEL BY RT-PCR (RSV, FLU A&B, COVID)  RVPGX2  LIPASE, BLOOD  LACTIC ACID, PLASMA  URINALYSIS, ROUTINE W REFLEX MICROSCOPIC  TROPONIN I (HIGH SENSITIVITY)  TROPONIN I (HIGH SENSITIVITY)     EKG  ED ECG REPORT I, Dionne Bucy, the attending physician, personally viewed and interpreted this ECG.  Date: 06/14/2023 EKG Time: 1656 Rate: 103 Rhythm: normal sinus rhythm QRS Axis: Right axis Intervals: normal ST/T Wave abnormalities: Nonspecific T wave abnormality Narrative Interpretation: no evidence of  acute ischemia    RADIOLOGY  Chest x-ray: I independently viewed and interpreted the images; there is no focal consolidation or edema  CT head:  IMPRESSION:  1. Interval evolution of intraparenchymal hematoma within the right  caudate head with decreasing hyperdensity, decreasing surrounding  cytotoxic edema, and resolution of intraventricular hemorrhage.  Minimal associated mass effect without associated midline shift. No  interval hemorrhage.    CT abdomen/pelvis:  IMPRESSION:  1. No acute intra-abdominal pathology identified. No definite  radiographic explanation for the patient's reported symptoms.    PROCEDURES:  Critical Care performed: No  Procedures   MEDICATIONS ORDERED IN ED: Medications  sodium chloride 0.9 % bolus 1,000 mL (0 mLs Intravenous Stopped 06/14/23 2323)  iohexol (OMNIPAQUE) 300 MG/ML solution 75 mL (75 mLs Intravenous Contrast Given 06/14/23 2100)     IMPRESSION / MDM / ASSESSMENT AND PLAN / ED COURSE  I reviewed the triage vital signs and the nursing notes.  57 year old female with PMH as noted above and status post recent hospitalization for hypertension and intracranial hemorrhage presents with persistent headaches now also with nausea and diarrhea as well as generalized weakness.  On exam she is hypertensive and borderline tachycardic.  Temperature is somewhat elevated.  Other vital signs are normal.  Neurologic exam is nonfocal.  Differential diagnosis includes, but is not limited to, acute infection/sepsis, possible UTI, pneumonia, or viral syndrome, dehydration, electrolyte abnormality, hyperglycemia, other metabolic cause, new or worsening intracranial hemorrhage, hypertension, cardiac etiology.  We will obtain CT head, CT abdomen/pelvis, chest x-ray, lab workup, give fluids, and reassess.  Patient's presentation is most consistent with acute presentation with potential threat to life or bodily function.  The patient is on the cardiac  monitor to evaluate for evidence of arrhythmia and/or significant heart rate changes.  ----------------------------------------- 11:54 PM on 06/14/2023 -----------------------------------------  CT head shows normal evolution of the ICH with no new or worsening bleed or swelling.  CT abdomen/pelvis is negative.  Chest x-ray shows no acute findings.  Lab workup is overall reassuring.  Urinalysis is still pending but lactate was mildly elevated.  Electrolytes are unremarkable.  BMP does show hyperglycemia.  Respiratory panel is negative.  The patient is feeling a bit better after fluids, but is still tachycardic and quite weak.  She will need admission for further workup and management.  I consulted Dr. Allena Katz from the hospitalist service; based on her discussion she agrees to evaluate the patient.   FINAL CLINICAL IMPRESSION(S) / ED DIAGNOSES   Final diagnoses:  Generalized weakness     Rx / DC Orders   ED Discharge Orders     None        Note:  This document was prepared using Dragon voice recognition software and may include unintentional dictation errors.    Dionne Bucy, MD 06/14/23 2355

## 2023-06-14 NOTE — ED Notes (Signed)
Called lab to add on UA from urine sent at 1700.

## 2023-06-14 NOTE — ED Notes (Signed)
FIRST NURSE: Pt to ER via EMS from [facility] where she was for a check up.  Reports weakness, headache and nausea starting yesterday.  Pt was release from [facility] on Friday.  For EMS CBG 395, pt given 250cc NS and 4mg  Zofran.

## 2023-06-15 ENCOUNTER — Inpatient Hospital Stay: Payer: 59

## 2023-06-15 DIAGNOSIS — R519 Headache, unspecified: Secondary | ICD-10-CM

## 2023-06-15 DIAGNOSIS — D497 Neoplasm of unspecified behavior of endocrine glands and other parts of nervous system: Secondary | ICD-10-CM | POA: Diagnosis not present

## 2023-06-15 DIAGNOSIS — N390 Urinary tract infection, site not specified: Secondary | ICD-10-CM | POA: Diagnosis present

## 2023-06-15 DIAGNOSIS — A419 Sepsis, unspecified organism: Secondary | ICD-10-CM | POA: Diagnosis present

## 2023-06-15 DIAGNOSIS — R531 Weakness: Principal | ICD-10-CM

## 2023-06-15 DIAGNOSIS — R112 Nausea with vomiting, unspecified: Secondary | ICD-10-CM | POA: Diagnosis present

## 2023-06-15 LAB — URINALYSIS, ROUTINE W REFLEX MICROSCOPIC
Bacteria, UA: NONE SEEN
Bilirubin Urine: NEGATIVE
Glucose, UA: >=500 mg/dL — AB
Hgb urine dipstick: NEGATIVE
Ketones, ur: 20 mg/dL — AB
Nitrite: POSITIVE — AB
Protein, ur: NEGATIVE mg/dL
Specific Gravity, Urine: 1.023 (ref 1.005–1.030)
WBC, UA: >50 WBC/hpf (ref 0–5)
pH: 5 (ref 5.0–8.0)

## 2023-06-15 LAB — GLUCOSE, CAPILLARY: Glucose-Capillary: 354 mg/dL — ABNORMAL HIGH (ref 70–99)

## 2023-06-15 LAB — CBG MONITORING, ED
Glucose-Capillary: 280 mg/dL — ABNORMAL HIGH (ref 70–99)
Glucose-Capillary: 264 mg/dL — ABNORMAL HIGH (ref 70–99)
Glucose-Capillary: 334 mg/dL — ABNORMAL HIGH (ref 70–99)

## 2023-06-15 MED ORDER — ENSURE ENLIVE PO LIQD
237.0000 mL | Freq: Two times a day (BID) | ORAL | Status: DC
  Administered 2023-06-16 – 2023-06-17 (×4): 237 mL via ORAL

## 2023-06-15 MED ORDER — ACETAMINOPHEN 325 MG PO TABS
650.0000 mg | ORAL_TABLET | ORAL | Status: DC | PRN
  Administered 2023-06-15 (×2): 650 mg via ORAL
  Filled 2023-06-15 (×2): qty 2

## 2023-06-15 MED ORDER — INSULIN ASPART 100 UNIT/ML IJ SOLN
0.0000 [IU] | Freq: Every day | INTRAMUSCULAR | Status: DC
  Administered 2023-06-15: 5 [IU] via SUBCUTANEOUS
  Administered 2023-06-16: 4 [IU] via SUBCUTANEOUS
  Filled 2023-06-15 (×2): qty 1

## 2023-06-15 MED ORDER — GABAPENTIN 300 MG PO CAPS
300.0000 mg | ORAL_CAPSULE | Freq: Two times a day (BID) | ORAL | Status: DC
  Administered 2023-06-15 – 2023-06-17 (×4): 300 mg via ORAL
  Filled 2023-06-15 (×5): qty 1

## 2023-06-15 MED ORDER — INSULIN ASPART 100 UNIT/ML IJ SOLN
0.0000 [IU] | Freq: Three times a day (TID) | INTRAMUSCULAR | Status: DC
  Administered 2023-06-15 (×2): 8 [IU] via SUBCUTANEOUS
  Filled 2023-06-15 (×2): qty 1

## 2023-06-15 MED ORDER — ONDANSETRON HCL 4 MG/2ML IJ SOLN
4.0000 mg | Freq: Four times a day (QID) | INTRAMUSCULAR | Status: DC | PRN
  Administered 2023-06-15: 4 mg via INTRAVENOUS
  Filled 2023-06-15: qty 2

## 2023-06-15 MED ORDER — INSULIN ASPART 100 UNIT/ML IJ SOLN
0.0000 [IU] | Freq: Three times a day (TID) | INTRAMUSCULAR | Status: DC
  Administered 2023-06-16: 8 [IU] via SUBCUTANEOUS
  Administered 2023-06-16: 11 [IU] via SUBCUTANEOUS
  Administered 2023-06-16: 15 [IU] via SUBCUTANEOUS
  Administered 2023-06-17: 8 [IU] via SUBCUTANEOUS
  Administered 2023-06-17: 11 [IU] via SUBCUTANEOUS
  Administered 2023-06-17: 8 [IU] via SUBCUTANEOUS
  Filled 2023-06-15 (×6): qty 1

## 2023-06-15 MED ORDER — METOPROLOL TARTRATE 25 MG PO TABS
12.5000 mg | ORAL_TABLET | Freq: Two times a day (BID) | ORAL | Status: DC
  Administered 2023-06-15 – 2023-06-17 (×4): 12.5 mg via ORAL
  Filled 2023-06-15 (×4): qty 1

## 2023-06-15 MED ORDER — METOPROLOL TARTRATE 5 MG/5ML IV SOLN
5.0000 mg | INTRAVENOUS | Status: DC | PRN
  Administered 2023-06-15: 5 mg via INTRAVENOUS
  Filled 2023-06-15: qty 5

## 2023-06-15 MED ORDER — ACETAMINOPHEN 650 MG RE SUPP: 650.0000 mg | RECTAL | Status: DC | PRN

## 2023-06-15 MED ORDER — PANTOPRAZOLE SODIUM 40 MG IV SOLR
40.0000 mg | Freq: Every day | INTRAVENOUS | Status: DC
  Administered 2023-06-15 – 2023-06-16 (×3): 40 mg via INTRAVENOUS
  Filled 2023-06-15 (×3): qty 10

## 2023-06-15 MED ORDER — AMLODIPINE BESYLATE 5 MG PO TABS
5.0000 mg | ORAL_TABLET | Freq: Every day | ORAL | Status: DC
  Administered 2023-06-16 – 2023-06-17 (×2): 5 mg via ORAL
  Filled 2023-06-15 (×2): qty 1

## 2023-06-15 MED ORDER — ACETAMINOPHEN 160 MG/5ML PO SOLN: 650.0000 mg | ORAL | Status: DC | PRN

## 2023-06-15 MED ORDER — AMLODIPINE BESYLATE 5 MG PO TABS
10.0000 mg | ORAL_TABLET | Freq: Every day | ORAL | Status: DC
  Administered 2023-06-15: 10 mg via ORAL
  Filled 2023-06-15: qty 2

## 2023-06-15 MED ORDER — STROKE: EARLY STAGES OF RECOVERY BOOK: Freq: Once | Status: AC

## 2023-06-15 MED ORDER — ATORVASTATIN CALCIUM 20 MG PO TABS
40.0000 mg | ORAL_TABLET | Freq: Every day | ORAL | Status: DC
  Administered 2023-06-15 – 2023-06-17 (×3): 40 mg via ORAL
  Filled 2023-06-15 (×3): qty 2

## 2023-06-15 MED ORDER — PROMETHAZINE (PHENERGAN) 6.25MG IN NS 50ML IVPB: 6.2500 mg | Freq: Four times a day (QID) | INTRAVENOUS | Status: DC | PRN

## 2023-06-15 MED ORDER — SODIUM CHLORIDE 0.9 % IV SOLN: INTRAVENOUS | Status: DC

## 2023-06-15 MED ORDER — GADOBUTROL 1 MMOL/ML IV SOLN
5.0000 mL | Freq: Once | INTRAVENOUS | Status: AC | PRN
  Administered 2023-06-15: 5 mL via INTRAVENOUS

## 2023-06-15 MED ORDER — GABAPENTIN (ONCE-DAILY) 300 MG PO TABS: 300.0000 mg | ORAL_TABLET | Freq: Every day | ORAL | Status: DC

## 2023-06-15 MED ORDER — SODIUM CHLORIDE 0.9 % IV SOLN
2.0000 g | INTRAVENOUS | Status: DC
  Administered 2023-06-15 – 2023-06-17 (×3): 2 g via INTRAVENOUS
  Filled 2023-06-15 (×4): qty 20

## 2023-06-15 MED ORDER — SODIUM CHLORIDE 0.9 % IV SOLN: Freq: Once | INTRAVENOUS | Status: DC

## 2023-06-15 NOTE — Assessment & Plan Note (Signed)
Due to UTI and sepsis/ dehydration.  Cont with MIVF at LR x 1 day.

## 2023-06-15 NOTE — Assessment & Plan Note (Signed)
Secondary to patient's hemorrhagic stroke. Vitals and blood pressure is otherwise stable today. Will avoid any NSAIDs and anti-inflammatories and antiplatelets and anticoagulation. Pain control with Tylenol.

## 2023-06-15 NOTE — Assessment & Plan Note (Signed)
Start pt on rocephin. Cultures added on.

## 2023-06-15 NOTE — Assessment & Plan Note (Signed)
Swallow study, sliding scale insulin regimen.

## 2023-06-15 NOTE — Assessment & Plan Note (Signed)
Secondary to patient's uncontrolled hypertension. Optimizing blood pressure control will be goal. So far from CT imaging there is mild improvement.  Discussed case with neurology for any AED prophylaxis treatment which they did not recommend, but agreed with MRI of the brain with and without contrast which is ordered and pending.

## 2023-06-15 NOTE — Assessment & Plan Note (Signed)
Patient meets sepsis criteria with UTI secondary to UTI. Will continue patient on Rocephin.

## 2023-06-15 NOTE — Assessment & Plan Note (Signed)
Vitals:   06/14/23 1646 06/14/23 2030 06/15/23 0010 06/15/23 0100  BP: 99/63 (!) 152/89 120/71 119/72   06/15/23 0221  BP: 112/69  Pt continued on amlodipine.  Lisinopril/ hydrochlorothiazide held and hydralazine held.

## 2023-06-15 NOTE — Inpatient Diabetes Management (Addendum)
Inpatient Diabetes Program Recommendations  AACE/ADA: New Consensus Statement on Inpatient Glycemic Control   Target Ranges:  Prepandial:   less than 140 mg/dL      Peak postprandial:   less than 180 mg/dL (1-2 hours)      Critically ill patients:  140 - 180 mg/dL    Latest Reference Range & Units 06/14/23 16:57 06/14/23 20:16 06/15/23 08:10  Glucose-Capillary 70 - 99 mg/dL 161 (H) 096 (H) 045 (H)    Latest Reference Range & Units 06/14/23 16:45  Glucose 70 - 99 mg/dL 409 (H)    Latest Reference Range & Units 06/06/23 03:18  Hemoglobin A1C 4.8 - 5.6 % 9.8 (H)   Review of Glycemic Control  Diabetes history: DM Outpatient Diabetes medications: Lantus 24 units daily, Metformin XR 1000 mg BID, Jardiance 25 mg daily Current orders for Inpatient glycemic control: Novolog 0-15 units TID with meals  Inpatient Diabetes Program Recommendations:    Insulin: Please consider ordering Semglee 14 units daily, adding Novolog 0-5 units at bedtime, and Novolog 3 units TID with meals for meal coverage if patient eats at least 50% of meals.  Diet: Please consider discontinuing Regular diet and ordering Carb Modified diet.  NOTE: Patient admitted 06/15/23 with acute nonintractable headache secondary to hemorrhagic stroke, sepsis due to UTI, N/V/D, generalized weakness. Patient was recently inpatient 06/05/23-06/09/23 and diabetes coordinator spoke with patient on 06/09/23 during that admission regarding poor DM control.   Thanks, Orlando Penner, RN, MSN, CDCES Diabetes Coordinator Inpatient Diabetes Program [phone] (Team Pager from 8am to 5pm)

## 2023-06-15 NOTE — Assessment & Plan Note (Signed)
IV PPI. Supportive care. MIVF.  Clear liquid or soft diet as tolerated and advance after swallow study.

## 2023-06-15 NOTE — Progress Notes (Addendum)
  INTERVAL PROGRESS NOTE  Ariana Jacobs- 57 y.o. female  LOS: 0 ____________________________________________________________ SUBJECTIVE: Admitted 06/14/2023 with cc of headache Since admission, stable  OBJECTIVE: Blood pressure 132/75, pulse (!) 124, temperature (!) 100.4 F (38 C), temperature source Oral, resp. rate 20, SpO2 96%.  ASSESSMENT/PLAN:  I have reviewed the full H&P by Dr. Tanna Furry, and other pertinent information to admission and spoke to daughter.   In addition:  Neurology evaluated and recommended tylenol and increased gabapentin for headache with no further workup at this time.  Her stroke is progressing as expected.   - supportive care PRN - encourage PO intake - trend electrolytes   Febrile today. Continue on CTX for UTI and follow UxCx. Also ordered Blood culture.  - tylenol PRN for comfort   Principal Problem:   Acute nonintractable headache Active Problems:   Hemorrhagic stroke (HCC)   Type 2 diabetes mellitus with hyperglycemia (HCC)   Uncontrolled hypertension   Generalized weakness   Nausea vomiting and diarrhea   Sepsis secondary to UTI (HCC)   Leeroy Bock, DO [facility] 06/15/2023, 4:30 PM    [email or web address] Available by Epic secure chat 7AM-7PM. If 7PM-7AM, please contact night-coverage   No Charge

## 2023-06-15 NOTE — Progress Notes (Addendum)
SLP Cancellation Note  Patient Details Name: Ariana Jacobs MRN: [id number] DOB: 1965/12/23   Cancelled treatment:       Reason Eval/Treat Not Completed: SLP screened, no needs identified, will sign off (chart reviewed; consulted NSG then met w/ pt in room.)   Reviewed pt's PMH noting Multiple ED visits(8 in last 6 months). Pt denied any difficulty swallowing and is currently on a regular diet; tolerates swallowing pills w/ water per NSG. Pt requested a "diet Gingerale" to drink so this was provided -- pt consumed several sips w/out any overt difficulty noted. NSG staff stated same earlier w/ po's. Pt conversed in general conversation w/out overt expressive/receptive deficits noted; pt denied any speech-language deficits. Speech intelligible as she made her wants/needs know("I done w/ the bedpan"; "could I have a diet Gingerale"; "I don't want any lunch right now b/c I had nausea earlier"). Pt continues to endorse "headaches"; Noted Neurology note("Alert, oriented, thought content appropriate. Speech fluent without evidence of aphasia. Able to follow 3 step commands without difficulty"), and the MRI revealing "8 x 8 x 8 mm hematoma centered at the right caudate head/right lateral ventricle, with trace intraventricular extension. No hydrocephalus. No visible underlying mass or abnormal enhancement". Noted OT note of pt's engagement.  No further skilled ST services indicated as pt appears at her communication and swallowing baseline. Pt agreed. NSG to reconsult if any change in status while admitted.       Jerilynn Som, MS, CCC-SLP Speech Language Pathologist Rehab Services; [facility] [phone] (ascom) Ariana Jacobs 06/15/2023, 12:43 PM

## 2023-06-15 NOTE — Consult Note (Signed)
Reason for Consult:Headache Requesting Physician: Dareen Piano  CC: Headache, nausea, vomiting  I have been asked by Dr. Dareen Piano to see this patient in consultation for headache.  HPI: Ariana Jacobs is an 57 y.o. female with history of hypertension and diabetes who began having nausea/vomiting and headache on 9/27.  Admitted and found to have a right caudate ICH felt secondary to poorly  controlled HTN.  Patient discharged on 10/4.  Reports continuing to have headaches daily.  Headaches are relieved with Extra Strength Tylenol but it takes a while for the Tylenol to kick in.  Has associated nausea, vomiting and diarrhea as well.     Past Medical History:  Diagnosis Date   Diabetes mellitus without complication (HCC)    Hypertension     Past Surgical History:  Procedure Laterality Date   CESAREAN SECTION     FOOT SURGERY      Family History  Problem Relation Age of Onset   Healthy Mother    Diabetes Father    Breast cancer Neg Hx     Social History:  reports that she has never smoked. She has never used smokeless tobacco. She reports current alcohol use. She reports that she does not use drugs.  No Known Allergies  Medications:  Prior to Admission medications   Medication Sig Start Date End Date Taking? Authorizing Provider  amLODipine (NORVASC) 10 MG tablet Take 10 mg by mouth daily.   Yes [provider]  atorvastatin (LIPITOR) 40 MG tablet Take 40 mg by mouth at bedtime. 08/16/21  Yes [provider]  Gabapentin, Once-Daily, 300 MG TABS Take 1 tablet (300 mg total) by mouth at bedtime. 03/02/23 06/14/23 Yes Shirlee Latch, PA-C  JARDIANCE 25 MG TABS tablet Take 25 mg by mouth daily. 04/21/23  Yes [provider]  LANTUS SOLOSTAR 100 UNIT/ML Solostar Pen Inject 24 Units into the skin at bedtime. 04/21/23  Yes [provider]  lisinopril-hydrochlorothiazide (ZESTORETIC) 20-25 MG tablet Take 1 tablet by mouth daily. 06/09/23  Yes Esaw Grandchild  A, DO  metFORMIN (GLUCOPHAGE-XR) 500 MG 24 hr tablet Take 1,000 mg by mouth 2 (two) times daily. 04/21/23  Yes [provider]  baclofen (LIORESAL) 10 MG tablet Take 1 tablet (10 mg total) by mouth 3 (three) times daily as needed for muscle spasms. Patient not taking: Reported on 06/14/2023 03/02/23   Shirlee Latch, PA-C  Blood Glucose Monitoring Suppl DEVI 1 each by Does not apply route in the morning, at noon, and at bedtime. May substitute to any manufacturer covered by patient's insurance. 06/09/23   Esaw Grandchild A, DO  Glucose Blood (BLOOD GLUCOSE TEST STRIPS) STRP 1 each by In Vitro route in the morning, at noon, and at bedtime. May substitute to any manufacturer covered by patient's insurance. 06/09/23 07/09/23  Pennie Banter, DO  Lancet Device MISC 1 each by Does not apply route in the morning, at noon, and at bedtime. May substitute to any manufacturer covered by patient's insurance. 06/09/23 07/09/23  Pennie Banter, DO  Lancets Misc. MISC 1 each by Does not apply route in the morning, at noon, and at bedtime. May substitute to any manufacturer covered by patient's insurance. 06/09/23 07/09/23  Pennie Banter, DO  omeprazole (PRILOSEC) 20 MG capsule Take 20 mg by mouth daily. Patient not taking: Reported on 06/15/2023 03/24/23   [provider]  GLIPIZIDE PO Take by mouth daily.  09/20/19  [provider]  HYDRALAZINE-HCTZ PO Take by mouth daily.  04/07/20  [provider]  hydrochlorothiazide (HYDRODIURIL) 25 MG tablet  01/01/19 09/20/19  [provider]  LISINOPRIL PO Take by mouth daily.  09/20/19  [provider]     ROS: History obtained from the patient  General ROS: negative for - chills, fatigue, fever, night sweats, weight gain or weight loss Psychological ROS: negative for - behavioral disorder, hallucinations, memory difficulties, mood swings or suicidal ideation Ophthalmic ROS: negative for - blurry vision, double vision,  eye pain or loss of vision ENT ROS: negative for - epistaxis, nasal discharge, oral lesions, sore throat, tinnitus or vertigo Allergy and Immunology ROS: negative for - hives or itchy/watery eyes Hematological and Lymphatic ROS: negative for - bleeding problems, bruising or swollen lymph nodes Endocrine ROS: negative for - galactorrhea, hair pattern changes, polydipsia/polyuria or temperature intolerance Respiratory ROS: negative for - cough, hemoptysis, shortness of breath or wheezing Cardiovascular ROS: negative for - chest pain, dyspnea on exertion, edema or irregular heartbeat Gastrointestinal ROS: as noted in HPI Genito-Urinary ROS: negative for - dysuria, hematuria, incontinence or urinary frequency/urgency Musculoskeletal ROS: negative for - joint swelling or muscular weakness Neurological ROS: as noted in HPI Dermatological ROS: negative for rash and skin lesion changes   Physical Examination: Blood pressure 113/68, pulse 94, temperature 99.5 F (37.5 C), temperature source Oral, resp. rate (!) 21, SpO2 97%.  Mental Status: Alert, oriented, thought content appropriate.  Speech fluent without evidence of aphasia.  Able to follow 3 step commands without difficulty. Cranial Nerves: II: Visual fields grossly normal III,IV, VI: ptosis not present, extra-ocular motions intact bilaterally V,VII: smile symmetric, facial light touch sensation normal bilaterally VIII: hearing normal bilaterally XI: bilateral shoulder shrug XII: midline tongue extension Motor: 5/5 throughout Sensory: Pinprick and light touch intact throughout, bilaterally Deep Tendon Reflexes: Symmetric throughout Plantars: Right: mute   Left: mute Cerebellar: normal finger-to-nose and normal heel-to-shin testing bilaterally   Laboratory Studies:   Basic Metabolic Panel: Recent Labs  Lab 06/09/23 0626 06/14/23 1645  NA 134* 132*  K 3.8 4.2  CL 95* 94*  CO2 30 24  GLUCOSE 229* 352*  BUN 22* 28*  CREATININE  0.63 0.79  CALCIUM 9.7 9.3    Liver Function Tests: Recent Labs  Lab 06/14/23 2049  AST 16  ALT 18  ALKPHOS 87  BILITOT 0.8  PROT 8.2*  ALBUMIN 3.9   Recent Labs  Lab 06/14/23 2049  LIPASE 48   No results for input(s): "AMMONIA" in the last 168 hours.  CBC: Recent Labs  Lab 06/14/23 1645  WBC 11.8*  HGB 10.8*  HCT 33.8*  MCV 92.9  PLT 295    Cardiac Enzymes: No results for input(s): "CKTOTAL", "CKMB", "CKMBINDEX", "TROPONINI" in the last 168 hours.  BNP: Invalid input(s): "POCBNP"  CBG: Recent Labs  Lab 06/08/23 2119 06/09/23 1234 06/14/23 1657 06/14/23 2016 06/15/23 0810  GLUCAP 200* 220* 386* 308* 280*    Microbiology: Results for orders placed or performed during the hospital encounter of 06/14/23  Resp panel by RT-PCR (RSV, Flu A&B, Covid) Anterior Nasal Swab     Status: None   Collection Time: 06/14/23  8:49 PM   Specimen: Anterior Nasal Swab  Result Value Ref Range Status   SARS Coronavirus 2 by RT PCR NEGATIVE NEGATIVE Final    Comment: (NOTE) SARS-CoV-2 target nucleic acids are NOT DETECTED.  The SARS-CoV-2 RNA is generally detectable in upper respiratory specimens during the acute phase of infection. The lowest concentration of SARS-CoV-2 viral copies this assay can  detect is 138 copies/mL. A negative result does not preclude SARS-Cov-2 infection and should not be used as the sole basis for treatment or other patient management decisions. A negative result may occur with  improper specimen collection/handling, submission of specimen other than nasopharyngeal swab, presence of viral mutation(s) within the areas targeted by this assay, and inadequate number of viral copies(<138 copies/mL). A negative result must be combined with clinical observations, patient history, and epidemiological information. The expected result is Negative.  Fact Sheet for Patients:  BloggerCourse.com  Fact Sheet for Healthcare  Providers:  SeriousBroker.it  This test is no t yet approved or cleared by the [place] FDA and  has been authorized for detection and/or diagnosis of SARS-CoV-2 by FDA under an Emergency Use Authorization (EUA). This EUA will remain  in effect (meaning this test can be used) for the duration of the COVID-19 declaration under Section 564(b)(1) of the Act, 21 U.S.C.section 360bbb-3(b)(1), unless the authorization is terminated  or revoked sooner.       Influenza A by PCR NEGATIVE NEGATIVE Final   Influenza B by PCR NEGATIVE NEGATIVE Final    Comment: (NOTE) The Xpert Xpress SARS-CoV-2/FLU/RSV plus assay is intended as an aid in the diagnosis of influenza from Nasopharyngeal swab specimens and should not be used as a sole basis for treatment. Nasal washings and aspirates are unacceptable for Xpert Xpress SARS-CoV-2/FLU/RSV testing.  Fact Sheet for Patients: BloggerCourse.com  Fact Sheet for Healthcare Providers: SeriousBroker.it  This test is not yet approved or cleared by the [place] FDA and has been authorized for detection and/or diagnosis of SARS-CoV-2 by FDA under an Emergency Use Authorization (EUA). This EUA will remain in effect (meaning this test can be used) for the duration of the COVID-19 declaration under Section 564(b)(1) of the Act, 21 U.S.C. section 360bbb-3(b)(1), unless the authorization is terminated or revoked.     Resp Syncytial Virus by PCR NEGATIVE NEGATIVE Final    Comment: (NOTE) Fact Sheet for Patients: BloggerCourse.com  Fact Sheet for Healthcare Providers: SeriousBroker.it  This test is not yet approved or cleared by the [place] FDA and has been authorized for detection and/or diagnosis of SARS-CoV-2 by FDA under an Emergency Use Authorization (EUA). This EUA will remain in effect (meaning this test can be  used) for the duration of the COVID-19 declaration under Section 564(b)(1) of the Act, 21 U.S.C. section 360bbb-3(b)(1), unless the authorization is terminated or revoked.  Performed at [facility], [address] [place], [place] [address]     Coagulation Studies: No results for input(s): "LABPROT", "INR" in the last 72 hours.  Urinalysis:  Recent Labs  Lab 06/14/23 1630  COLORURINE YELLOW*  LABSPEC 1.023  PHURINE 5.0  GLUCOSEU >=500*  HGBUR NEGATIVE  BILIRUBINUR NEGATIVE  KETONESUR 20*  PROTEINUR NEGATIVE  NITRITE POSITIVE*  LEUKOCYTESUR SMALL*    Lipid Panel:  No results found for: "CHOL", "TRIG", "HDL", "CHOLHDL", "VLDL", "LDLCALC"  HgbA1C:  Lab Results  Component Value Date   HGBA1C 9.8 (H) 06/06/2023    Urine Drug Screen:  No results found for: "LABOPIA", "COCAINSCRNUR", "LABBENZ", "AMPHETMU", "THCU", "LABBARB"  Alcohol Level: No results for input(s): "ETH" in the last 168 hours.  Imaging: MR BRAIN W WO CONTRAST  Result Date: 06/15/2023 CLINICAL DATA:  Initial evaluation for acute headache. EXAM: MRI HEAD WITHOUT AND WITH CONTRAST TECHNIQUE: Multiplanar, multiecho pulse sequences of the brain and surrounding structures were obtained without and with intravenous contrast. CONTRAST:  5mL GADAVIST GADOBUTROL 1 MMOL/ML IV  SOLN COMPARISON:  Prior CT from 06/14/2023 as well as earlier studies. FINDINGS: Brain: Cerebral volume within normal limits. Scattered subcentimeter foci of T2/FLAIR hyperintensity involving the supratentorial cerebral white matter, nonspecific, but most commonly related to chronic microvascular ischemic disease. Changes are mild for age. Previously identified hematoma positioned at the right caudate head/right lateral ventricle again seen, now measuring 8 x 8 x 8 mm (series 11, image 29). Minimal surrounding edema without significant regional mass effect or significant midline shift. Trace intraventricular extension with small amount of  blood seen layering within the occipital horns of the lateral ventricles. No hydrocephalus. No visible underlying mass or abnormal enhancement. No other acute intracranial hemorrhage few additional punctate chronic micro hemorrhages noted elsewhere, nonspecific. No acute or subacute infarct. No areas of chronic cortical infarction. No other mass lesion or mass effect. No extra-axial fluid collection. Pituitary gland is somewhat irregular and lobulated with inferior extension into the clivus (series 9, image 14). Suprasellar region within normal limits. No other abnormal enhancement. Vascular: Major intracranial vascular flow voids are maintained. Skull and upper cervical spine: Craniocervical junction within normal limits. Bone marrow signal intensity normal. No scalp soft tissue abnormality. Sinuses/Orbits: Globes orbital soft tissues within normal limits. Paranasal sinuses are largely clear. Trace bilateral mastoid effusions, of doubtful significance. Other: Sequelae of prior right parotidectomy, partially visualized. IMPRESSION: 1. 8 x 8 x 8 mm hematoma centered at the right caudate head/right lateral ventricle, with trace intraventricular extension. No hydrocephalus. No visible underlying mass or abnormal enhancement. Continued CT follow-up to resolution recommended. 2. Irregular and lobulated pituitary gland with inferior extension into the clivus, indeterminate. Correlation with pituitary function tests suggested. Additionally, follow-up examination with nonemergent pituitary protocol mass MRI could be performed for further evaluation as warranted. 3. No other acute intracranial abnormality. 4. Underlying mild cerebral white matter disease, nonspecific, but most commonly related to chronic microvascular ischemic disease. Electronically Signed   By: Rise Mu M.D.   On: 06/15/2023 03:26   CT ABDOMEN PELVIS W CONTRAST  Result Date: 06/14/2023 CLINICAL DATA:  Acute nonlocalized abdominal pain EXAM:  CT ABDOMEN AND PELVIS WITH CONTRAST TECHNIQUE: Multidetector CT imaging of the abdomen and pelvis was performed using the standard protocol following bolus administration of intravenous contrast. RADIATION DOSE REDUCTION: This exam was performed according to the departmental dose-optimization program which includes automated exposure control, adjustment of the mA and/or kV according to patient size and/or use of iterative reconstruction technique. CONTRAST:  75mL OMNIPAQUE IOHEXOL 300 MG/ML  SOLN COMPARISON:  None Available. FINDINGS: Lower chest: No acute abnormality. Hepatobiliary: No focal liver abnormality is seen. No gallstones, gallbladder wall thickening, or biliary dilatation. Pancreas: Unremarkable Spleen: Unremarkable Adrenals/Urinary Tract: The adrenal glands are unremarkable. The kidneys are normal in size and position. Cortical hypodensities within the kidneys bilaterally are not well characterized due to motion artifact, however, these appear grossly stable since prior examination and were compatible with simple cortical cysts at that time. No follow-up imaging is recommended for these lesions. The kidneys are otherwise unremarkable. Bladder is unremarkable. Stomach/Bowel: Stomach is within normal limits. The appendix is not clearly identified, however, no secondary signs of appendicitis are identified within the right quadrant. No evidence of bowel wall thickening, distention, or inflammatory changes. Vascular/Lymphatic: No significant vascular findings are present. No enlarged abdominal or pelvic lymph nodes. Reproductive: Uterus and bilateral adnexa are unremarkable. Other: No abdominal wall hernia or abnormality. No abdominopelvic ascites. Musculoskeletal: No acute or significant osseous findings. IMPRESSION: 1. No acute intra-abdominal pathology identified. No  definite radiographic explanation for the patient's reported symptoms. Electronically Signed   By: Helyn Numbers M.D.   On: 06/14/2023  22:48   CT Head Wo Contrast  Result Date: 06/14/2023 CLINICAL DATA:  Recent intracranial hemorrhage, progressive headache EXAM: CT HEAD WITHOUT CONTRAST TECHNIQUE: Contiguous axial images were obtained from the base of the skull through the vertex without intravenous contrast. RADIATION DOSE REDUCTION: This exam was performed according to the departmental dose-optimization program which includes automated exposure control, adjustment of the mA and/or kV according to patient size and/or use of iterative reconstruction technique. COMPARISON:  None Available. FINDINGS: Brain: There is been interval evolution of the intraparenchymal hematoma within the right caudate head which demonstrates decreasing hyperdensity, decreasing surrounding cytotoxic edema, and now measures roughly 6 x 18 mm in size. Previously noted intraventricular hemorrhage has resolved. Minimal associated mass effect related to the intraparenchymal hematoma without associated midline shift. No interval hemorrhage. Ventricular size is normal. Cerebellum is unremarkable. Vascular: No hyperdense vessel or unexpected calcification. Skull: Normal. Negative for fracture or focal lesion. Sinuses/Orbits: No acute finding. Other: Mastoid air cells and middle ear cavities are clear. IMPRESSION: 1. Interval evolution of intraparenchymal hematoma within the right caudate head with decreasing hyperdensity, decreasing surrounding cytotoxic edema, and resolution of intraventricular hemorrhage. Minimal associated mass effect without associated midline shift. No interval hemorrhage. Electronically Signed   By: Helyn Numbers M.D.   On: 06/14/2023 22:41   DG Chest Port 1 View  Result Date: 06/14/2023 CLINICAL DATA:  Weakness. EXAM: PORTABLE CHEST 1 VIEW COMPARISON:  Radiograph 06/27/2019 FINDINGS: The cardiomediastinal contours are normal. The lungs are clear. Pulmonary vasculature is normal. No consolidation, pleural effusion, or pneumothorax. No acute osseous  abnormalities are seen. IMPRESSION: No acute chest findings. Electronically Signed   By: Narda Rutherford M.D.   On: 06/14/2023 22:34     Assessment/Plan: 57 y.o. female with history of hypertension and diabetes who began having nausea/vomiting and headache on 9/27.  Admitted and found to have a right caudate ICH felt secondary to poorly  controlled HTN.  Patient discharged on 10/4.  Reports continuing to have headaches daily.  Headaches are relieved with Extra Strength Tylenol but it takes a while for the Tylenol to kick in.  Has associated nausea, vomiting and diarrhea as well.  Reports no headache today.  MRI of the brain personally reviewed and shows evolution of hemorrhage with improvement and a pituitary gland mass.   BP controlled therefore daily headaches likely a consequence of previous hemorrhage.  Should improve over time.    Recommendations: Patient already on Neurontin.  May use this for headache prophylaxis as well and increase to 300mg  BID.  May continue to use Tylenol prn ACTH, TSH, growth hormone, prolactin, cortisol, FSH/LH for follow up of pituitary mass seen on imaging.  May be followed up on an outpatient basis with future follow up imaging required as well.    No further neurologic intervention is recommended at this time.  If further questions arise, please call or page at that time.  Thank you for allowing neurology to participate in the care of this patient.  Thana Farr, MD Neurology   Thana Farr, MD Neurology  06/15/2023, 9:28 AM

## 2023-06-15 NOTE — ED Notes (Signed)
ED TO INPATIENT HANDOFF REPORT  ED Nurse Name and Phone #: Victorino Dike #[phone]  S Name/Age/Gender Ariana Jacobs 57 y.o. female Room/Bed: ED38A/ED38A  Code Status   Code Status: Prior  Home/SNF/Other Home Patient oriented to: self, place, time, and situation Is this baseline? Yes   Triage Complete: Triage complete  Chief Complaint Nausea vomiting and diarrhea [R11.2, R19.7] UTI (urinary tract infection) [N39.0]  Triage Note First nurse note "from clinic [facility] she has weakness, headache, [facility]" CBG 395"  Pt states that she has been feeling unwell and generally weak since Sunday, no energy, no appetite.    Allergies No Known Allergies  Level of Care/Admitting Diagnosis ED Disposition     ED Disposition  Admit   Condition  --   Comment  Hospital Area: [facility] [[id number]]  Level of Care: Telemetry Medical [104]  Covid Evaluation: Asymptomatic - no recent exposure (last 10 days) testing not required  Diagnosis: UTI (urinary tract infection) [109323]  Admitting Physician: Leeroy Bock [[id number]]  Attending Physician: Leeroy Bock [[id number]]  Certification:: I certify this patient will need inpatient services for at least 2 midnights          B Medical/Surgery History Past Medical History:  Diagnosis Date   Diabetes mellitus without complication (HCC)    Hypertension    Past Surgical History:  Procedure Laterality Date   CESAREAN SECTION     FOOT SURGERY       A IV Location/Drains/Wounds Patient Lines/Drains/Airways Status     Active Line/Drains/Airways     Name Placement date Placement time Site Days   Peripheral IV 06/14/23 20 G Anterior;Right Forearm 06/14/23  1604  Forearm  1            Intake/Output Last 24 hours  Intake/Output Summary (Last 24 hours) at 06/15/2023 2148 Last data filed at 06/15/2023 0540 Gross per 24 hour  Intake 100 ml  Output --  Net 100 ml     Labs/Imaging Results for orders placed or performed during the hospital encounter of 06/14/23 (from the past 48 hour(s))  Urinalysis, Routine w reflex microscopic -Urine, Clean Catch     Status: Abnormal   Collection Time: 06/14/23  4:30 PM  Result Value Ref Range   Color, Urine YELLOW (A) YELLOW   APPearance HAZY (A) CLEAR   Specific Gravity, Urine 1.023 1.005 - 1.030   pH 5.0 5.0 - 8.0   Glucose, UA >=500 (A) NEGATIVE mg/dL   Hgb urine dipstick NEGATIVE NEGATIVE   Bilirubin Urine NEGATIVE NEGATIVE   Ketones, ur 20 (A) NEGATIVE mg/dL   Protein, ur NEGATIVE NEGATIVE mg/dL   Nitrite POSITIVE (A) NEGATIVE   Leukocytes,Ua SMALL (A) NEGATIVE   RBC / HPF 0-5 0 - 5 RBC/hpf   WBC, UA >50 0 - 5 WBC/hpf   Bacteria, UA NONE SEEN NONE SEEN   Squamous Epithelial / HPF 0-5 0 - 5 /HPF   WBC Clumps PRESENT    Mucus PRESENT    Amorphous Crystal PRESENT     Comment: Performed at [facility], [address] [place], [place] [address]  CBC     Status: Abnormal   Collection Time: 06/14/23  4:45 PM  Result Value Ref Range   WBC 11.8 (H) 4.0 - 10.5 K/uL   RBC 3.64 (L) 3.87 - 5.11 MIL/uL   Hemoglobin 10.8 (L) 12.0 - 15.0 g/dL   HCT 83.1 (L) 51.7 - 61.6 %   MCV  92.9 80.0 - 100.0 fL   MCH 29.7 26.0 - 34.0 pg   MCHC 32.0 30.0 - 36.0 g/dL   RDW 40.9 81.1 - 91.4 %   Platelets 295 150 - 400 K/uL   nRBC 0.0 0.0 - 0.2 %    Comment: Performed at [facility], [address] [place], [place] [address]  Basic metabolic panel     Status: Abnormal   Collection Time: 06/14/23  4:45 PM  Result Value Ref Range   Sodium 132 (L) 135 - 145 mmol/L   Potassium 4.2 3.5 - 5.1 mmol/L   Chloride 94 (L) 98 - 111 mmol/L   CO2 24 22 - 32 mmol/L   Glucose, Bld 352 (H) 70 - 99 mg/dL    Comment: Glucose reference range applies only to samples taken after fasting for at least 8 hours.   BUN 28 (H) 6 - 20 mg/dL   Creatinine, Ser 6.21 0.44 - 1.00 mg/dL   Calcium 9.3 8.9 - 30.8 mg/dL   GFR,  Estimated >65 >78 mL/min    Comment: (NOTE) Calculated using the CKD-EPI Creatinine Equation (2021)    Anion gap 14 5 - 15    Comment: Performed at [facility], [address] [place], [place] [address]  CBG monitoring, ED     Status: Abnormal   Collection Time: 06/14/23  4:57 PM  Result Value Ref Range   Glucose-Capillary 386 (H) 70 - 99 mg/dL    Comment: Glucose reference range applies only to samples taken after fasting for at least 8 hours.  CBG monitoring, ED     Status: Abnormal   Collection Time: 06/14/23  8:16 PM  Result Value Ref Range   Glucose-Capillary 308 (H) 70 - 99 mg/dL    Comment: Glucose reference range applies only to samples taken after fasting for at least 8 hours.  Troponin I (High Sensitivity)     Status: None   Collection Time: 06/14/23  8:49 PM  Result Value Ref Range   Troponin I (High Sensitivity) 7 <18 ng/L    Comment: (NOTE) Elevated high sensitivity troponin I (hsTnI) values and significant  changes across serial measurements may suggest ACS but many other  chronic and acute conditions are known to elevate hsTnI results.  Refer to the "Links" section for chest pain algorithms and additional  guidance. Performed at [facility], [address] [place], [place] [address]   Resp panel by RT-PCR (RSV, Flu A&B, Covid) Anterior Nasal Swab     Status: None   Collection Time: 06/14/23  8:49 PM   Specimen: Anterior Nasal Swab  Result Value Ref Range   SARS Coronavirus 2 by RT PCR NEGATIVE NEGATIVE    Comment: (NOTE) SARS-CoV-2 target nucleic acids are NOT DETECTED.  The SARS-CoV-2 RNA is generally detectable in upper respiratory specimens during the acute phase of infection. The lowest concentration of SARS-CoV-2 viral copies this assay can detect is 138 copies/mL. A negative result does not preclude SARS-Cov-2 infection and should not be used as the sole basis for treatment or other patient management decisions. A negative result  may occur with  improper specimen collection/handling, submission of specimen other than nasopharyngeal swab, presence of viral mutation(s) within the areas targeted by this assay, and inadequate number of viral copies(<138 copies/mL). A negative result must be combined with clinical observations, patient history, and epidemiological information. The expected result is Negative.  Fact Sheet for Patients:  BloggerCourse.com  Fact Sheet for Healthcare Providers:  SeriousBroker.it  This test is no t yet approved or cleared by the [place] and  has been authorized for detection and/or diagnosis of SARS-CoV-2 by FDA under an Emergency Use Authorization (EUA). This EUA will remain  in effect (meaning this test can be used) for the duration of the COVID-19 declaration under Section 564(b)(1) of the Act, 21 U.S.C.section 360bbb-3(b)(1), unless the authorization is terminated  or revoked sooner.       Influenza A by PCR NEGATIVE NEGATIVE   Influenza B by PCR NEGATIVE NEGATIVE    Comment: (NOTE) The Xpert Xpress SARS-CoV-2/FLU/RSV plus assay is intended as an aid in the diagnosis of influenza from Nasopharyngeal swab specimens and should not be used as a sole basis for treatment. Nasal washings and aspirates are unacceptable for Xpert Xpress SARS-CoV-2/FLU/RSV testing.  Fact Sheet for Patients: BloggerCourse.com  Fact Sheet for Healthcare Providers: SeriousBroker.it  This test is not yet approved or cleared by the [place] FDA and has been authorized for detection and/or diagnosis of SARS-CoV-2 by FDA under an Emergency Use Authorization (EUA). This EUA will remain in effect (meaning this test can be used) for the duration of the COVID-19 declaration under Section 564(b)(1) of the Act, 21 U.S.C. section 360bbb-3(b)(1), unless the authorization is terminated  or revoked.     Resp Syncytial Virus by PCR NEGATIVE NEGATIVE    Comment: (NOTE) Fact Sheet for Patients: BloggerCourse.com  Fact Sheet for Healthcare Providers: SeriousBroker.it  This test is not yet approved or cleared by the [place] FDA and has been authorized for detection and/or diagnosis of SARS-CoV-2 by FDA under an Emergency Use Authorization (EUA). This EUA will remain in effect (meaning this test can be used) for the duration of the COVID-19 declaration under Section 564(b)(1) of the Act, 21 U.S.C. section 360bbb-3(b)(1), unless the authorization is terminated or revoked.  Performed at [facility], [address] [place], [place] [address]   Hepatic function panel     Status: Abnormal   Collection Time: 06/14/23  8:49 PM  Result Value Ref Range   Total Protein 8.2 (H) 6.5 - 8.1 g/dL   Albumin 3.9 3.5 - 5.0 g/dL   AST 16 15 - 41 U/L   ALT 18 0 - 44 U/L   Alkaline Phosphatase 87 38 - 126 U/L   Total Bilirubin 0.8 0.3 - 1.2 mg/dL   Bilirubin, Direct 0.1 0.0 - 0.2 mg/dL   Indirect Bilirubin 0.7 0.3 - 0.9 mg/dL    Comment: Performed at [facility], [address] [place], [place] [address]  Lipase, blood     Status: None   Collection Time: 06/14/23  8:49 PM  Result Value Ref Range   Lipase 48 11 - 51 U/L    Comment: Performed at [facility], [address] [place], [place] [address]  Lactic acid, plasma     Status: Abnormal   Collection Time: 06/14/23  8:49 PM  Result Value Ref Range   Lactic Acid, Venous 2.2 (HH) 0.5 - 1.9 mmol/L    Comment: CRITICAL RESULT CALLED TO, READ BACK BY AND VERIFIED WITH Soeun, Hailey @2156  on 06/14/23 skl Performed at [facility], [address] [place], [place] [address]   Lactic acid, plasma     Status: None   Collection Time: 06/14/23 11:21 PM  Result Value Ref Range   Lactic Acid, Venous 1.0 0.5 - 1.9 mmol/L    Comment:  Performed at [facility], [address], [place], [place]  [address]  Troponin I (High Sensitivity)     Status: None   Collection Time: 06/14/23 11:21 PM  Result Value Ref Range   Troponin I (High Sensitivity) 9 <18 ng/L    Comment: (NOTE) Elevated high sensitivity troponin I (hsTnI) values and significant  changes across serial measurements may suggest ACS but many other  chronic and acute conditions are known to elevate hsTnI results.  Refer to the "Links" section for chest pain algorithms and additional  guidance. Performed at [facility], [address] [place], [place] [address]   CBG monitoring, ED     Status: Abnormal   Collection Time: 06/15/23  8:10 AM  Result Value Ref Range   Glucose-Capillary 280 (H) 70 - 99 mg/dL    Comment: Glucose reference range applies only to samples taken after fasting for at least 8 hours.  CBG monitoring, ED     Status: Abnormal   Collection Time: 06/15/23 12:23 PM  Result Value Ref Range   Glucose-Capillary 264 (H) 70 - 99 mg/dL    Comment: Glucose reference range applies only to samples taken after fasting for at least 8 hours.   MR BRAIN W WO CONTRAST  Result Date: 06/15/2023 CLINICAL DATA:  Initial evaluation for acute headache. EXAM: MRI HEAD WITHOUT AND WITH CONTRAST TECHNIQUE: Multiplanar, multiecho pulse sequences of the brain and surrounding structures were obtained without and with intravenous contrast. CONTRAST:  5mL GADAVIST GADOBUTROL 1 MMOL/ML IV SOLN COMPARISON:  Prior CT from 06/14/2023 as well as earlier studies. FINDINGS: Brain: Cerebral volume within normal limits. Scattered subcentimeter foci of T2/FLAIR hyperintensity involving the supratentorial cerebral white matter, nonspecific, but most commonly related to chronic microvascular ischemic disease. Changes are mild for age. Previously identified hematoma positioned at the right caudate head/right lateral ventricle again seen, now measuring 8 x 8 x 8 mm  (series 11, image 29). Minimal surrounding edema without significant regional mass effect or significant midline shift. Trace intraventricular extension with small amount of blood seen layering within the occipital horns of the lateral ventricles. No hydrocephalus. No visible underlying mass or abnormal enhancement. No other acute intracranial hemorrhage few additional punctate chronic micro hemorrhages noted elsewhere, nonspecific. No acute or subacute infarct. No areas of chronic cortical infarction. No other mass lesion or mass effect. No extra-axial fluid collection. Pituitary gland is somewhat irregular and lobulated with inferior extension into the clivus (series 9, image 14). Suprasellar region within normal limits. No other abnormal enhancement. Vascular: Major intracranial vascular flow voids are maintained. Skull and upper cervical spine: Craniocervical junction within normal limits. Bone marrow signal intensity normal. No scalp soft tissue abnormality. Sinuses/Orbits: Globes orbital soft tissues within normal limits. Paranasal sinuses are largely clear. Trace bilateral mastoid effusions, of doubtful significance. Other: Sequelae of prior right parotidectomy, partially visualized. IMPRESSION: 1. 8 x 8 x 8 mm hematoma centered at the right caudate head/right lateral ventricle, with trace intraventricular extension. No hydrocephalus. No visible underlying mass or abnormal enhancement. Continued CT follow-up to resolution recommended. 2. Irregular and lobulated pituitary gland with inferior extension into the clivus, indeterminate. Correlation with pituitary function tests suggested. Additionally, follow-up examination with nonemergent pituitary protocol mass MRI could be performed for further evaluation as warranted. 3. No other acute intracranial abnormality. 4. Underlying mild cerebral white matter disease, nonspecific, but most commonly related to chronic microvascular ischemic disease. Electronically  Signed   By: Rise Mu M.D.   On: 06/15/2023 03:26   CT ABDOMEN PELVIS W CONTRAST  Result Date: 06/14/2023 CLINICAL  DATA:  Acute nonlocalized abdominal pain EXAM: CT ABDOMEN AND PELVIS WITH CONTRAST TECHNIQUE: Multidetector CT imaging of the abdomen and pelvis was performed using the standard protocol following bolus administration of intravenous contrast. RADIATION DOSE REDUCTION: This exam was performed according to the departmental dose-optimization program which includes automated exposure control, adjustment of the mA and/or kV according to patient size and/or use of iterative reconstruction technique. CONTRAST:  75mL OMNIPAQUE IOHEXOL 300 MG/ML  SOLN COMPARISON:  None Available. FINDINGS: Lower chest: No acute abnormality. Hepatobiliary: No focal liver abnormality is seen. No gallstones, gallbladder wall thickening, or biliary dilatation. Pancreas: Unremarkable Spleen: Unremarkable Adrenals/Urinary Tract: The adrenal glands are unremarkable. The kidneys are normal in size and position. Cortical hypodensities within the kidneys bilaterally are not well characterized due to motion artifact, however, these appear grossly stable since prior examination and were compatible with simple cortical cysts at that time. No follow-up imaging is recommended for these lesions. The kidneys are otherwise unremarkable. Bladder is unremarkable. Stomach/Bowel: Stomach is within normal limits. The appendix is not clearly identified, however, no secondary signs of appendicitis are identified within the right quadrant. No evidence of bowel wall thickening, distention, or inflammatory changes. Vascular/Lymphatic: No significant vascular findings are present. No enlarged abdominal or pelvic lymph nodes. Reproductive: Uterus and bilateral adnexa are unremarkable. Other: No abdominal wall hernia or abnormality. No abdominopelvic ascites. Musculoskeletal: No acute or significant osseous findings. IMPRESSION: 1. No acute  intra-abdominal pathology identified. No definite radiographic explanation for the patient's reported symptoms. Electronically Signed   By: Helyn Numbers M.D.   On: 06/14/2023 22:48   CT Head Wo Contrast  Result Date: 06/14/2023 CLINICAL DATA:  Recent intracranial hemorrhage, progressive headache EXAM: CT HEAD WITHOUT CONTRAST TECHNIQUE: Contiguous axial images were obtained from the base of the skull through the vertex without intravenous contrast. RADIATION DOSE REDUCTION: This exam was performed according to the departmental dose-optimization program which includes automated exposure control, adjustment of the mA and/or kV according to patient size and/or use of iterative reconstruction technique. COMPARISON:  None Available. FINDINGS: Brain: There is been interval evolution of the intraparenchymal hematoma within the right caudate head which demonstrates decreasing hyperdensity, decreasing surrounding cytotoxic edema, and now measures roughly 6 x 18 mm in size. Previously noted intraventricular hemorrhage has resolved. Minimal associated mass effect related to the intraparenchymal hematoma without associated midline shift. No interval hemorrhage. Ventricular size is normal. Cerebellum is unremarkable. Vascular: No hyperdense vessel or unexpected calcification. Skull: Normal. Negative for fracture or focal lesion. Sinuses/Orbits: No acute finding. Other: Mastoid air cells and middle ear cavities are clear. IMPRESSION: 1. Interval evolution of intraparenchymal hematoma within the right caudate head with decreasing hyperdensity, decreasing surrounding cytotoxic edema, and resolution of intraventricular hemorrhage. Minimal associated mass effect without associated midline shift. No interval hemorrhage. Electronically Signed   By: Helyn Numbers M.D.   On: 06/14/2023 22:41   DG Chest Port 1 View  Result Date: 06/14/2023 CLINICAL DATA:  Weakness. EXAM: PORTABLE CHEST 1 VIEW COMPARISON:  Radiograph 06/27/2019  FINDINGS: The cardiomediastinal contours are normal. The lungs are clear. Pulmonary vasculature is normal. No consolidation, pleural effusion, or pneumothorax. No acute osseous abnormalities are seen. IMPRESSION: No acute chest findings. Electronically Signed   By: Narda Rutherford M.D.   On: 06/14/2023 22:34    Pending Labs Unresulted Labs (From admission, onward)     Start     Ordered   06/16/23 0500  CBC  Tomorrow morning,   R  06/15/23 1639   06/16/23 0500  Comprehensive metabolic panel  Tomorrow morning,   R        06/15/23 1639   06/15/23 1136  Culture, blood (Routine X 2) w Reflex to ID Panel  BLOOD CULTURE X 2,   TIMED      06/15/23 1135   06/15/23 0102  Urine Culture (for pregnant, neutropenic or urologic patients or patients with an indwelling urinary catheter)  (Urine Labs)  Add-on,   AD       Question:  Indication  Answer:  Urgency/frequency   06/15/23 0102   Pending  Urine Culture  Add-on,   R       Question:  Indication  Answer:  Urgency/frequency   Pending            Vitals/Pain Today's Vitals   06/15/23 1900 06/15/23 1930 06/15/23 2030 06/15/23 2100  BP: 107/60 (!) 106/59 (!) 94/57 (!) 100/59  Pulse: 91 88 94 95  Resp: 20 15 (!) 26 (!) 24  Temp:      TempSrc:      SpO2: 96% 98% 96% 97%  PainSc:        Isolation Precautions No active isolations  Medications Medications   stroke: early stages of recovery book (has no administration in time range)  acetaminophen (TYLENOL) tablet 650 mg (650 mg Oral Given 06/15/23 1206)    Or  acetaminophen (TYLENOL) 160 MG/5ML solution 650 mg ( Per Tube See Alternative 06/15/23 1206)    Or  acetaminophen (TYLENOL) suppository 650 mg ( Rectal See Alternative 06/15/23 1206)  pantoprazole (PROTONIX) injection 40 mg (40 mg Intravenous Given 06/15/23 0101)  atorvastatin (LIPITOR) tablet 40 mg (40 mg Oral Given 06/15/23 1009)  insulin aspart (novoLOG) injection 0-15 Units ( Subcutaneous Patient Refused/Not Given 06/15/23  1708)  cefTRIAXone (ROCEPHIN) 2 g in sodium chloride 0.9 % 100 mL IVPB (0 g Intravenous Stopped 06/15/23 0540)  metoprolol tartrate (LOPRESSOR) injection 5 mg (5 mg Intravenous Given 06/15/23 1206)  gabapentin (NEURONTIN) capsule 300 mg (300 mg Oral Patient Refused/Not Given 06/15/23 1430)  ondansetron (ZOFRAN) injection 4 mg (4 mg Intravenous Given 06/15/23 1813)  promethazine (PHENERGAN) 6.25 mg/NS 50 mL IVPB (has no administration in time range)  metoprolol tartrate (LOPRESSOR) tablet 12.5 mg (12.5 mg Oral Given 06/15/23 1813)  amLODipine (NORVASC) tablet 5 mg (has no administration in time range)  sodium chloride 0.9 % bolus 1,000 mL (0 mLs Intravenous Stopped 06/14/23 2323)  iohexol (OMNIPAQUE) 300 MG/ML solution 75 mL (75 mLs Intravenous Contrast Given 06/14/23 2100)  gadobutrol (GADAVIST) 1 MMOL/ML injection 5 mL (5 mLs Intravenous Contrast Given 06/15/23 0118)    Mobility walks with person assist     Focused Assessments GI - pt having complaints of nausea and upset stomach, poor appetite and not tolerating foods very well. Pt did have some episodes of vomiting. Pt having some loose stools. Using bedpan. Pt very weak but able to roll independently in bed.   R Recommendations: See Admitting Provider Note  Report given to:   Additional Notes: Pt may need encouragement to do ADLs for self.

## 2023-06-15 NOTE — ED Notes (Signed)
Lab called for blood culture collection

## 2023-06-15 NOTE — H&P (Signed)
History and Physical    Patient: Ariana Jacobs ZOX:[id number] DOB: 24-Apr-1966 DOA: 06/14/2023 DOS: the patient was seen and examined on 06/15/2023 PCP: Alba Cory, MD  Patient coming from: [facility] health clinic Chief Complaint:  Chief Complaint  Patient presents with   Weakness   HPI: CHARDAI GANGEMI is a 57 y.o. female with medical history significant for diabetes mellitus 2, hypertension, intracranial hemorrhage admission and discharged on October 4 presents today from [facility] health clinic for complaints of headache nausea vomiting diarrhea and generalized weakness patient was released on Friday after her hemorrhagic stroke. Per report patient has been having persistent headaches since her discharge they have not improved.  And over the last 1 to 2 days patient's had nausea and vomiting and diarrhea that she reports to me.  No abdominal pain no fevers or chills no dysuria back pain chest pain palpitations or any other complaints.  In ED alert awake oriented keeps her eyes closed for her headache but otherwise is nonfocal follows commands.  EKG shows sinus tach at 113 right axis deviation and no ST-T wave changes.  Initial chest x-ray is negative for any focal consolidation.   Neuroimaging head CT noncontrast shows Interval evolution of intraparenchymal hematoma within the right  caudate head with decreasing hyperdensity, decreasing surrounding  cytotoxic edema, and resolution of intraventricular hemorrhage.  Minimal associated mass effect without associated midline shift. No  interval hemorrhage.  Patient also had CT of the abdomen and pelvis which was negative for any acute intra abdominal pathology.  Labs show metabolic panel shows mild hyponatremia of 132 glucose 352 normal kidney function normal LFTs.  Initial lactic of 2.2 which has resolved to 1 after IV fluids.  CBC shows leukocytosis of 11.8 10.8 hemoglobin and normal platelets.  Respiratory panel is negative for flu  RSV and COVID. Urinalysis is abnormal and consistent with UTI.  Culture collected in the emergency room.  Review of Systems: Review of Systems  Constitutional:  Positive for malaise/fatigue.  Gastrointestinal:  Positive for diarrhea, nausea and vomiting.  Neurological:  Positive for headaches.  All other systems reviewed and are negative.   Past Medical History:  Diagnosis Date   Diabetes mellitus without complication (HCC)    Hypertension    Past Surgical History:  Procedure Laterality Date   CESAREAN SECTION     FOOT SURGERY     Social History:   reports that she has never smoked. She has never used smokeless tobacco. She reports current alcohol use. She reports that she does not use drugs.  No Known Allergies  Family History  Problem Relation Age of Onset   Healthy Mother    Diabetes Father    Breast cancer Neg Hx     Prior to Admission medications   Medication Sig Start Date End Date Taking? Authorizing Provider  amLODipine (NORVASC) 10 MG tablet Take 10 mg by mouth daily.   Yes [provider]  atorvastatin (LIPITOR) 40 MG tablet Take 40 mg by mouth at bedtime. 08/16/21  Yes [provider]  Gabapentin, Once-Daily, 300 MG TABS Take 1 tablet (300 mg total) by mouth at bedtime. 03/02/23 06/14/23 Yes Shirlee Latch, PA-C  JARDIANCE 25 MG TABS tablet Take 25 mg by mouth daily. 04/21/23  Yes [provider]  LANTUS SOLOSTAR 100 UNIT/ML Solostar Pen Inject 24 Units into the skin at bedtime. 04/21/23  Yes [provider]  lisinopril-hydrochlorothiazide (ZESTORETIC) 20-25 MG tablet Take 1 tablet by mouth daily. 06/09/23  Yes Esaw Grandchild  A, DO  metFORMIN (GLUCOPHAGE-XR) 500 MG 24 hr tablet Take 1,000 mg by mouth 2 (two) times daily. 04/21/23  Yes [provider]  naproxen (NAPROSYN) 375 MG tablet Take 1 tablet (375 mg total) by mouth 2 (two) times daily as needed for moderate pain. 03/02/23  Yes Shirlee Latch, PA-C  omeprazole  (PRILOSEC) 20 MG capsule Take 20 mg by mouth daily. 03/24/23  Yes [provider]  baclofen (LIORESAL) 10 MG tablet Take 1 tablet (10 mg total) by mouth 3 (three) times daily as needed for muscle spasms. Patient not taking: Reported on 06/14/2023 03/02/23   Shirlee Latch, PA-C  Blood Glucose Monitoring Suppl DEVI 1 each by Does not apply route in the morning, at noon, and at bedtime. May substitute to any manufacturer covered by patient's insurance. 06/09/23   Esaw Grandchild A, DO  Glucose Blood (BLOOD GLUCOSE TEST STRIPS) STRP 1 each by In Vitro route in the morning, at noon, and at bedtime. May substitute to any manufacturer covered by patient's insurance. 06/09/23 07/09/23  Pennie Banter, DO  Lancet Device MISC 1 each by Does not apply route in the morning, at noon, and at bedtime. May substitute to any manufacturer covered by patient's insurance. 06/09/23 07/09/23  Pennie Banter, DO  Lancets Misc. MISC 1 each by Does not apply route in the morning, at noon, and at bedtime. May substitute to any manufacturer covered by patient's insurance. 06/09/23 07/09/23  Esaw Grandchild A, DO  GLIPIZIDE PO Take by mouth daily.  09/20/19  [provider]  HYDRALAZINE-HCTZ PO Take by mouth daily.  04/07/20  [provider]  hydrochlorothiazide (HYDRODIURIL) 25 MG tablet  01/01/19 09/20/19  [provider]  LISINOPRIL PO Take by mouth daily.  09/20/19  [provider]     Vitals:   06/15/23 0010 06/15/23 0025 06/15/23 0100 06/15/23 0221  BP: 120/71  119/72 112/69  Pulse: (!) 112  (!) 109 (!) 104  Resp: (!) 21  (!) 23 (!) 23  Temp:  (!) 100.8 F (38.2 C)  99.5 F (37.5 C)  TempSrc:  Oral  Oral  SpO2: 96%  98% 97%   Physical Exam Vitals and nursing note reviewed.  Constitutional:      General: She is not in acute distress. HENT:     Head: Normocephalic and atraumatic.     Right Ear: Hearing and external ear normal.     Left Ear: Hearing and external ear  normal.     Nose: Nose normal. No nasal deformity.     Mouth/Throat:     Lips: Pink.     Tongue: No lesions.     Pharynx: Oropharynx is clear.  Eyes:     General: Lids are normal.     Extraocular Movements: Extraocular movements intact.     Pupils: Pupils are equal, round, and reactive to light.  Cardiovascular:     Rate and Rhythm: Normal rate and regular rhythm.     Heart sounds: Normal heart sounds.  Pulmonary:     Effort: Pulmonary effort is normal.     Breath sounds: Normal breath sounds.  Abdominal:     General: Bowel sounds are normal. There is no distension.     Palpations: Abdomen is soft. There is no mass.     Tenderness: There is no abdominal tenderness.  Musculoskeletal:     Right lower leg: No edema.     Left lower leg: No edema.  Skin:    General:  Skin is warm.  Neurological:     General: No focal deficit present.     Mental Status: She is alert and oriented to person, place, and time.     Cranial Nerves: Cranial nerves 2-12 are intact. No cranial nerve deficit.     Motor: No weakness.     Coordination: Coordination normal.  Psychiatric:        Attention and Perception: Attention normal.        Mood and Affect: Mood normal.        Speech: Speech normal.        Behavior: Behavior normal. Behavior is cooperative.      Labs on Admission: I have personally reviewed following labs and imaging studies  CBC: Recent Labs  Lab 06/14/23 1645  WBC 11.8*  HGB 10.8*  HCT 33.8*  MCV 92.9  PLT 295   Basic Metabolic Panel: Recent Labs  Lab 06/08/23 0446 06/09/23 0626 06/14/23 1645  NA 135 134* 132*  K 3.5 3.8 4.2  CL 97* 95* 94*  CO2 26 30 24   GLUCOSE 200* 229* 352*  BUN 20 22* 28*  CREATININE 0.61 0.63 0.79  CALCIUM 9.5 9.7 9.3   GFR: Estimated Creatinine Clearance: 52.3 mL/min (by C-G formula based on SCr of 0.79 mg/dL). Liver Function Tests: Recent Labs  Lab 06/14/23 2049  AST 16  ALT 18  ALKPHOS 87  BILITOT 0.8  PROT 8.2*  ALBUMIN 3.9    Recent Labs  Lab 06/14/23 2049  LIPASE 48   No results for input(s): "AMMONIA" in the last 168 hours. Coagulation Profile: No results for input(s): "INR", "PROTIME" in the last 168 hours. Cardiac Enzymes: No results for input(s): "CKTOTAL", "CKMB", "CKMBINDEX", "TROPONINI" in the last 168 hours. BNP (last 3 results) No results for input(s): "PROBNP" in the last 8760 hours. HbA1C: No results for input(s): "HGBA1C" in the last 72 hours. CBG: Recent Labs  Lab 06/08/23 1506 06/08/23 2119 06/09/23 1234 06/14/23 1657 06/14/23 2016  GLUCAP 219* 200* 220* 386* 308*   Lipid Profile: No results for input(s): "CHOL", "HDL", "LDLCALC", "TRIG", "CHOLHDL", "LDLDIRECT" in the last 72 hours. Thyroid Function Tests: No results for input(s): "TSH", "T4TOTAL", "FREET4", "T3FREE", "THYROIDAB" in the last 72 hours. Anemia Panel: No results for input(s): "VITAMINB12", "FOLATE", "FERRITIN", "TIBC", "IRON", "RETICCTPCT" in the last 72 hours. Urinalysis    Component Value Date/Time   COLORURINE YELLOW (A) 06/14/2023 1630   APPEARANCEUR HAZY (A) 06/14/2023 1630   APPEARANCEUR Clear 02/26/2013 1051   LABSPEC 1.023 06/14/2023 1630   LABSPEC 1.020 02/26/2013 1051   PHURINE 5.0 06/14/2023 1630   GLUCOSEU >=500 (A) 06/14/2023 1630   GLUCOSEU >=500 02/26/2013 1051   HGBUR NEGATIVE 06/14/2023 1630   BILIRUBINUR NEGATIVE 06/14/2023 1630   BILIRUBINUR Negative 02/26/2013 1051   KETONESUR 20 (A) 06/14/2023 1630   PROTEINUR NEGATIVE 06/14/2023 1630   NITRITE POSITIVE (A) 06/14/2023 1630   LEUKOCYTESUR SMALL (A) 06/14/2023 1630   LEUKOCYTESUR Negative 02/26/2013 1051   Unresulted Labs (From admission, onward)     Start     Ordered   06/15/23 0102  Urine Culture (for pregnant, neutropenic or urologic patients or patients with an indwelling urinary catheter)  (Urine Labs)  Add-on,   AD       Question:  Indication  Answer:  Urgency/frequency   06/15/23 0102   Pending  Urine Culture  Add-on,   R        Question:  Indication  Answer:  Urgency/frequency   Pending  Medications   stroke: early stages of recovery book (has no administration in time range)  acetaminophen (TYLENOL) tablet 650 mg (650 mg Oral Given 06/15/23 0059)    Or  acetaminophen (TYLENOL) 160 MG/5ML solution 650 mg ( Per Tube See Alternative 06/15/23 0059)    Or  acetaminophen (TYLENOL) suppository 650 mg ( Rectal See Alternative 06/15/23 0059)  pantoprazole (PROTONIX) injection 40 mg (40 mg Intravenous Given 06/15/23 0101)  0.9 %  sodium chloride infusion ( Intravenous New Bag/Given 06/15/23 0105)  amLODipine (NORVASC) tablet 10 mg (has no administration in time range)  atorvastatin (LIPITOR) tablet 40 mg (has no administration in time range)  Gabapentin (Once-Daily) TABS 300 mg (has no administration in time range)  insulin aspart (novoLOG) injection 0-15 Units (has no administration in time range)  0.9 %  sodium chloride infusion (has no administration in time range)  sodium chloride 0.9 % bolus 1,000 mL (0 mLs Intravenous Stopped 06/14/23 2323)  iohexol (OMNIPAQUE) 300 MG/ML solution 75 mL (75 mLs Intravenous Contrast Given 06/14/23 2100)  gadobutrol (GADAVIST) 1 MMOL/ML injection 5 mL (5 mLs Intravenous Contrast Given 06/15/23 0118)   Radiological Exams on Admission: MR BRAIN W WO CONTRAST  Result Date: 06/15/2023 CLINICAL DATA:  Initial evaluation for acute headache. EXAM: MRI HEAD WITHOUT AND WITH CONTRAST TECHNIQUE: Multiplanar, multiecho pulse sequences of the brain and surrounding structures were obtained without and with intravenous contrast. CONTRAST:  5mL GADAVIST GADOBUTROL 1 MMOL/ML IV SOLN COMPARISON:  Prior CT from 06/14/2023 as well as earlier studies. FINDINGS: Brain: Cerebral volume within normal limits. Scattered subcentimeter foci of T2/FLAIR hyperintensity involving the supratentorial cerebral white matter, nonspecific, but most commonly related to chronic microvascular ischemic disease.  Changes are mild for age. Previously identified hematoma positioned at the right caudate head/right lateral ventricle again seen, now measuring 8 x 8 x 8 mm (series 11, image 29). Minimal surrounding edema without significant regional mass effect or significant midline shift. Trace intraventricular extension with small amount of blood seen layering within the occipital horns of the lateral ventricles. No hydrocephalus. No visible underlying mass or abnormal enhancement. No other acute intracranial hemorrhage few additional punctate chronic micro hemorrhages noted elsewhere, nonspecific. No acute or subacute infarct. No areas of chronic cortical infarction. No other mass lesion or mass effect. No extra-axial fluid collection. Pituitary gland is somewhat irregular and lobulated with inferior extension into the clivus (series 9, image 14). Suprasellar region within normal limits. No other abnormal enhancement. Vascular: Major intracranial vascular flow voids are maintained. Skull and upper cervical spine: Craniocervical junction within normal limits. Bone marrow signal intensity normal. No scalp soft tissue abnormality. Sinuses/Orbits: Globes orbital soft tissues within normal limits. Paranasal sinuses are largely clear. Trace bilateral mastoid effusions, of doubtful significance. Other: Sequelae of prior right parotidectomy, partially visualized. IMPRESSION: 1. 8 x 8 x 8 mm hematoma centered at the right caudate head/right lateral ventricle, with trace intraventricular extension. No hydrocephalus. No visible underlying mass or abnormal enhancement. Continued CT follow-up to resolution recommended. 2. Irregular and lobulated pituitary gland with inferior extension into the clivus, indeterminate. Correlation with pituitary function tests suggested. Additionally, follow-up examination with nonemergent pituitary protocol mass MRI could be performed for further evaluation as warranted. 3. No other acute intracranial  abnormality. 4. Underlying mild cerebral white matter disease, nonspecific, but most commonly related to chronic microvascular ischemic disease. Electronically Signed   By: Rise Mu M.D.   On: 06/15/2023 03:26   CT ABDOMEN PELVIS W CONTRAST  Result Date: 06/14/2023 CLINICAL  DATA:  Acute nonlocalized abdominal pain EXAM: CT ABDOMEN AND PELVIS WITH CONTRAST TECHNIQUE: Multidetector CT imaging of the abdomen and pelvis was performed using the standard protocol following bolus administration of intravenous contrast. RADIATION DOSE REDUCTION: This exam was performed according to the departmental dose-optimization program which includes automated exposure control, adjustment of the mA and/or kV according to patient size and/or use of iterative reconstruction technique. CONTRAST:  75mL OMNIPAQUE IOHEXOL 300 MG/ML  SOLN COMPARISON:  None Available. FINDINGS: Lower chest: No acute abnormality. Hepatobiliary: No focal liver abnormality is seen. No gallstones, gallbladder wall thickening, or biliary dilatation. Pancreas: Unremarkable Spleen: Unremarkable Adrenals/Urinary Tract: The adrenal glands are unremarkable. The kidneys are normal in size and position. Cortical hypodensities within the kidneys bilaterally are not well characterized due to motion artifact, however, these appear grossly stable since prior examination and were compatible with simple cortical cysts at that time. No follow-up imaging is recommended for these lesions. The kidneys are otherwise unremarkable. Bladder is unremarkable. Stomach/Bowel: Stomach is within normal limits. The appendix is not clearly identified, however, no secondary signs of appendicitis are identified within the right quadrant. No evidence of bowel wall thickening, distention, or inflammatory changes. Vascular/Lymphatic: No significant vascular findings are present. No enlarged abdominal or pelvic lymph nodes. Reproductive: Uterus and bilateral adnexa are unremarkable.  Other: No abdominal wall hernia or abnormality. No abdominopelvic ascites. Musculoskeletal: No acute or significant osseous findings. IMPRESSION: 1. No acute intra-abdominal pathology identified. No definite radiographic explanation for the patient's reported symptoms. Electronically Signed   By: Helyn Numbers M.D.   On: 06/14/2023 22:48   CT Head Wo Contrast  Result Date: 06/14/2023 CLINICAL DATA:  Recent intracranial hemorrhage, progressive headache EXAM: CT HEAD WITHOUT CONTRAST TECHNIQUE: Contiguous axial images were obtained from the base of the skull through the vertex without intravenous contrast. RADIATION DOSE REDUCTION: This exam was performed according to the departmental dose-optimization program which includes automated exposure control, adjustment of the mA and/or kV according to patient size and/or use of iterative reconstruction technique. COMPARISON:  None Available. FINDINGS: Brain: There is been interval evolution of the intraparenchymal hematoma within the right caudate head which demonstrates decreasing hyperdensity, decreasing surrounding cytotoxic edema, and now measures roughly 6 x 18 mm in size. Previously noted intraventricular hemorrhage has resolved. Minimal associated mass effect related to the intraparenchymal hematoma without associated midline shift. No interval hemorrhage. Ventricular size is normal. Cerebellum is unremarkable. Vascular: No hyperdense vessel or unexpected calcification. Skull: Normal. Negative for fracture or focal lesion. Sinuses/Orbits: No acute finding. Other: Mastoid air cells and middle ear cavities are clear. IMPRESSION: 1. Interval evolution of intraparenchymal hematoma within the right caudate head with decreasing hyperdensity, decreasing surrounding cytotoxic edema, and resolution of intraventricular hemorrhage. Minimal associated mass effect without associated midline shift. No interval hemorrhage. Electronically Signed   By: Helyn Numbers M.D.   On:  06/14/2023 22:41   DG Chest Port 1 View  Result Date: 06/14/2023 CLINICAL DATA:  Weakness. EXAM: PORTABLE CHEST 1 VIEW COMPARISON:  Radiograph 06/27/2019 FINDINGS: The cardiomediastinal contours are normal. The lungs are clear. Pulmonary vasculature is normal. No consolidation, pleural effusion, or pneumothorax. No acute osseous abnormalities are seen. IMPRESSION: No acute chest findings. Electronically Signed   By: Narda Rutherford M.D.   On: 06/14/2023 22:34     Data Reviewed: Relevant notes from primary care and specialist visits, past discharge summaries as available in EHR, including Care Everywhere. Prior diagnostic testing as pertinent to current admission diagnoses Updated medications and problem lists  for reconciliation ED course, including vitals, labs, imaging, treatment and response to treatment Triage notes, nursing and pharmacy notes and ED provider's notes Notable results as noted in HPI  Assessment and Plan: * Acute nonintractable headache Secondary to patient's hemorrhagic stroke. Vitals and blood pressure is otherwise stable today. Will avoid any NSAIDs and anti-inflammatories and antiplatelets and anticoagulation. Pain control with Tylenol.  Sepsis secondary to UTI [facility]) Start pt on rocephin. Cultures added on.   Nausea vomiting and diarrhea IV PPI. Supportive care. MIVF.  Clear liquid or soft diet as tolerated and advance after swallow study.  Generalized weakness Due to UTI and sepsis/ dehydration.  Cont with MIVF at LR x 1 day.   Uncontrolled hypertension Vitals:   06/14/23 1646 06/14/23 2030 06/15/23 0010 06/15/23 0100  BP: 99/63 (!) 152/89 120/71 119/72   06/15/23 0221  BP: 112/69  Pt continued on amlodipine.  Lisinopril/ hydrochlorothiazide held and hydralazine held.    Type 2 diabetes mellitus with hyperglycemia (HCC) Swallow study, sliding scale insulin regimen.  Hemorrhagic stroke (HCC) Secondary to patient's uncontrolled  hypertension. Optimizing blood pressure control will be goal. So far from CT imaging there is mild improvement.  Discussed case with neurology for any AED prophylaxis treatment which they did not recommend, but agreed with MRI of the brain with and without contrast which is ordered and pending.   DVT prophylaxis:  SCD's  Consults:  Neurology: Dr. Thad Ranger.   Advance Care Planning:    Code Status: Prior   Family Communication:  None   Disposition Plan:  Home   Severity of Illness: The appropriate patient status for this patient is INPATIENT. Inpatient status is judged to be reasonable and necessary in order to provide the required intensity of service to ensure the patient's safety. The patient's presenting symptoms, physical exam findings, and initial radiographic and laboratory data in the context of their chronic comorbidities is felt to place them at high risk for further clinical deterioration. Furthermore, it is not anticipated that the patient will be medically stable for discharge from the hospital within 2 midnights of admission.   * I certify that at the point of admission it is my clinical judgment that the patient will require inpatient hospital care spanning beyond 2 midnights from the point of admission due to high intensity of service, high risk for further deterioration and high frequency of surveillance required.*  Author: Gertha Calkin, MD 06/15/2023 3:29 AM  For on call review [email or web address].

## 2023-06-16 DIAGNOSIS — I619 Nontraumatic intracerebral hemorrhage, unspecified: Secondary | ICD-10-CM

## 2023-06-16 DIAGNOSIS — N390 Urinary tract infection, site not specified: Secondary | ICD-10-CM

## 2023-06-16 DIAGNOSIS — R531 Weakness: Secondary | ICD-10-CM | POA: Diagnosis not present

## 2023-06-16 DIAGNOSIS — A419 Sepsis, unspecified organism: Secondary | ICD-10-CM | POA: Diagnosis not present

## 2023-06-16 DIAGNOSIS — R519 Headache, unspecified: Secondary | ICD-10-CM | POA: Diagnosis not present

## 2023-06-16 LAB — COMPREHENSIVE METABOLIC PANEL
ALT: 13 U/L (ref 0–44)
AST: 13 U/L — ABNORMAL LOW (ref 15–41)
Albumin: 3.1 g/dL — ABNORMAL LOW (ref 3.5–5.0)
Alkaline Phosphatase: 98 U/L (ref 38–126)
Anion gap: 13 (ref 5–15)
BUN: 25 mg/dL — ABNORMAL HIGH (ref 6–20)
CO2: 27 mmol/L (ref 22–32)
Calcium: 9.3 mg/dL (ref 8.9–10.3)
Chloride: 96 mmol/L — ABNORMAL LOW (ref 98–111)
Creatinine, Ser: 0.77 mg/dL (ref 0.44–1.00)
GFR, Estimated: >60 mL/min (ref >60)
Glucose, Bld: 340 mg/dL — ABNORMAL HIGH (ref 70–99)
Potassium: 3.2 mmol/L — ABNORMAL LOW (ref 3.5–5.1)
Sodium: 136 mmol/L (ref 135–145)
Total Bilirubin: 0.7 mg/dL (ref 0.3–1.2)
Total Protein: 7 g/dL (ref 6.5–8.1)

## 2023-06-16 LAB — CBC
HCT: 31.1 % — ABNORMAL LOW (ref 36.0–46.0)
Hemoglobin: 10.3 g/dL — ABNORMAL LOW (ref 12.0–15.0)
MCH: 30.1 pg (ref 26.0–34.0)
MCHC: 33.1 g/dL (ref 30.0–36.0)
MCV: 90.9 fL (ref 80.0–100.0)
Platelets: 251 10*3/uL (ref 150–400)
RBC: 3.42 MIL/uL — ABNORMAL LOW (ref 3.87–5.11)
RDW: 13.2 % (ref 11.5–15.5)
WBC: 23.6 10*3/uL — ABNORMAL HIGH (ref 4.0–10.5)
nRBC: 0 % (ref 0.0–0.2)

## 2023-06-16 LAB — GLUCOSE, CAPILLARY
Glucose-Capillary: 319 mg/dL — ABNORMAL HIGH (ref 70–99)
Glucose-Capillary: 354 mg/dL — ABNORMAL HIGH (ref 70–99)
Glucose-Capillary: 286 mg/dL — ABNORMAL HIGH (ref 70–99)
Glucose-Capillary: 341 mg/dL — ABNORMAL HIGH (ref 70–99)

## 2023-06-16 LAB — HEMOGLOBIN A1C
Hgb A1c MFr Bld: 9.9 % — ABNORMAL HIGH (ref 4.8–5.6)
Mean Plasma Glucose: 237.43 mg/dL

## 2023-06-16 MED ORDER — ORAL CARE MOUTH RINSE: 15.0000 mL | OROMUCOSAL | Status: DC | PRN

## 2023-06-16 MED ORDER — INSULIN GLARGINE-YFGN 100 UNIT/ML ~~LOC~~ SOLN
14.0000 [IU] | Freq: Every day | SUBCUTANEOUS | Status: DC
  Administered 2023-06-16 – 2023-06-17 (×2): 14 [IU] via SUBCUTANEOUS
  Filled 2023-06-16 (×2): qty 0.14

## 2023-06-16 MED ORDER — LORATADINE 10 MG PO TABS
10.0000 mg | ORAL_TABLET | Freq: Every day | ORAL | Status: DC
  Administered 2023-06-16 – 2023-06-17 (×2): 10 mg via ORAL
  Filled 2023-06-16 (×2): qty 1

## 2023-06-16 MED ORDER — PSEUDOEPHEDRINE HCL ER 120 MG PO TB12
120.0000 mg | ORAL_TABLET | Freq: Once | ORAL | Status: AC
  Administered 2023-06-16: 120 mg via ORAL
  Filled 2023-06-16: qty 1

## 2023-06-16 NOTE — Progress Notes (Signed)
PROGRESS NOTE  Ariana Jacobs    DOB: [birth year], 57 y.o.  ZOX:[id number]    Code Status: Prior   DOA: 06/14/2023   LOS: 1   Brief hospital course  Ariana Jacobs is a 57 y.o. female with medical history significant for diabetes mellitus 2, hypertension, recent hemorrhagic stroke managed medically.  They presented to the hospital with headache, nausea, vomiting.    In ED alert awake oriented keeps her eyes closed for her headache but otherwise is nonfocal follows commands.   Na+ 132 glucose 352 normal kidney function normal LFTs.  Initial lactic of 2.2 which has resolved to 1 after IV fluids.  CBC shows leukocytosis of 11.8 10.8 hemoglobin and normal platelets.  Respiratory panel is negative for flu RSV and COVID. Urinalysis is abnormal and consistent with UTI.  Culture collected EKG: sinus tach at 113 right axis deviation and no ST-T wave changes.  Initial chest x-ray is negative for any focal consolidation.   Neuroimaging head CT noncontrast shows Interval evolution of intraparenchymal hematoma within the right  caudate head with decreasing hyperdensity, decreasing surrounding  cytotoxic edema, and resolution of intraventricular hemorrhage.  Minimal associated mass effect without associated midline shift. No  interval hemorrhage.  Patient also had CT of the abdomen and pelvis which was negative for any acute intra abdominal pathology.  They were initially treated with analgesia and anti-emetics. Neurology was consulted.   Patient was admitted to medicine service for further workup and management of post-stroke headache as outlined in detail below.  06/16/23 -stable  Assessment & Plan  Principal Problem:   Acute nonintractable headache Active Problems:   Hemorrhagic stroke (HCC)   Type 2 diabetes mellitus with hyperglycemia (HCC)   Uncontrolled hypertension   Generalized weakness   Nausea vomiting and diarrhea   Sepsis secondary to UTI [facility])   UTI (urinary tract  infection)  Hemorrhagic stroke [facility])- neurology consulted and did not recommend further workup after MRI showed stable post-stroke evolution.  - will need CT follow-up per neurology recs   Acute nonintractable headache- resolved. Multifactorial in setting of recent hemorrhagic stroke, dehydration from illness.  - analgesia PRN   Sepsis secondary to UTI [facility])- afebrile about 24 hours now.  - continue rocephin - follow cultures and de-escalate Abx as able   Nausea vomiting and diarrhea- resolved.    Hypertension-well controlled - continue home amlodipine, metoprolol Lisinopril/ hydrochlorothiazide held and hydralazine held.    Type 2 diabetes mellitus with hyperglycemia (HCC) - semglee - sSSI  There is no height or weight on file to calculate BMI.  VTE ppx: Place and maintain sequential compression device Start: 06/15/23 1605   Diet:     Diet   Diet regular Fluid consistency: Thin   Consultants: Neurology   Subjective 06/16/23    Pt reports feeling much improved. She has no nausea, vomiting, abdominal pain, or headache currently.    Objective   Vitals:   06/15/23 2100 06/15/23 2200 06/15/23 2236 06/16/23 0449  BP: (!) 100/59 (!) 101/58 107/65 107/62  Pulse: 95 92 87 95  Resp: (!) 24 (!) 28 20 18   Temp:   99 F (37.2 C) 98.6 F (37 C)  TempSrc:      SpO2: 97% 97% 99% 99%   No intake or output data in the 24 hours ending 06/16/23 0729 There were no vitals filed for this visit.   Physical Exam:  General: awake, alert, NAD HEENT: atraumatic, clear conjunctiva, anicteric sclera, MMM, hearing grossly normal Respiratory: normal  respiratory effort. Cardiovascular: quick capillary refill, normal S1/S2, RRR, no JVD, murmurs Gastrointestinal: soft, NT, ND Nervous: A&O x3. no gross focal neurologic deficits, normal speech Extremities: moves all equally, no edema, normal tone Skin: dry, intact, normal temperature, normal color. No rashes, lesions or ulcers on exposed  skin Psychiatry: normal mood, congruent affect  Labs   I have personally reviewed the following labs and imaging studies CBC    Component Value Date/Time   WBC 23.6 (H) 06/16/2023 0433   RBC 3.42 (L) 06/16/2023 0433   HGB 10.3 (L) 06/16/2023 0433   HGB 11.9 (L) 02/26/2013 1028   HCT 31.1 (L) 06/16/2023 0433   HCT 35.0 02/26/2013 1028   PLT 251 06/16/2023 0433   PLT 252 02/26/2013 1028   MCV 90.9 06/16/2023 0433   MCV 91 02/26/2013 1028   MCH 30.1 06/16/2023 0433   MCHC 33.1 06/16/2023 0433   RDW 13.2 06/16/2023 0433   RDW 12.6 02/26/2013 1028   LYMPHSABS 2.3 10/30/2018 1854   MONOABS 0.5 10/30/2018 1854   EOSABS 0.1 10/30/2018 1854   BASOSABS 0.0 10/30/2018 1854      Latest Ref Rng & Units 06/16/2023    4:33 AM 06/14/2023    4:45 PM 06/09/2023    6:26 AM  BMP  Glucose 70 - 99 mg/dL 409  811  914   BUN 6 - 20 mg/dL 25  28  22    Creatinine 0.44 - 1.00 mg/dL 7.82  9.56  2.13   Sodium 135 - 145 mmol/L 136  132  134   Potassium 3.5 - 5.1 mmol/L 3.2  4.2  3.8   Chloride 98 - 111 mmol/L 96  94  95   CO2 22 - 32 mmol/L 27  24  30    Calcium 8.9 - 10.3 mg/dL 9.3  9.3  9.7     MR BRAIN W WO CONTRAST  Result Date: 06/15/2023 CLINICAL DATA:  Initial evaluation for acute headache. EXAM: MRI HEAD WITHOUT AND WITH CONTRAST TECHNIQUE: Multiplanar, multiecho pulse sequences of the brain and surrounding structures were obtained without and with intravenous contrast. CONTRAST:  5mL GADAVIST GADOBUTROL 1 MMOL/ML IV SOLN COMPARISON:  Prior CT from 06/14/2023 as well as earlier studies. FINDINGS: Brain: Cerebral volume within normal limits. Scattered subcentimeter foci of T2/FLAIR hyperintensity involving the supratentorial cerebral white matter, nonspecific, but most commonly related to chronic microvascular ischemic disease. Changes are mild for age. Previously identified hematoma positioned at the right caudate head/right lateral ventricle again seen, now measuring 8 x 8 x 8 mm (series 11,  image 29). Minimal surrounding edema without significant regional mass effect or significant midline shift. Trace intraventricular extension with small amount of blood seen layering within the occipital horns of the lateral ventricles. No hydrocephalus. No visible underlying mass or abnormal enhancement. No other acute intracranial hemorrhage few additional punctate chronic micro hemorrhages noted elsewhere, nonspecific. No acute or subacute infarct. No areas of chronic cortical infarction. No other mass lesion or mass effect. No extra-axial fluid collection. Pituitary gland is somewhat irregular and lobulated with inferior extension into the clivus (series 9, image 14). Suprasellar region within normal limits. No other abnormal enhancement. Vascular: Major intracranial vascular flow voids are maintained. Skull and upper cervical spine: Craniocervical junction within normal limits. Bone marrow signal intensity normal. No scalp soft tissue abnormality. Sinuses/Orbits: Globes orbital soft tissues within normal limits. Paranasal sinuses are largely clear. Trace bilateral mastoid effusions, of doubtful significance. Other: Sequelae of prior right parotidectomy, partially visualized. IMPRESSION: 1. 8  x 8 x 8 mm hematoma centered at the right caudate head/right lateral ventricle, with trace intraventricular extension. No hydrocephalus. No visible underlying mass or abnormal enhancement. Continued CT follow-up to resolution recommended. 2. Irregular and lobulated pituitary gland with inferior extension into the clivus, indeterminate. Correlation with pituitary function tests suggested. Additionally, follow-up examination with nonemergent pituitary protocol mass MRI could be performed for further evaluation as warranted. 3. No other acute intracranial abnormality. 4. Underlying mild cerebral white matter disease, nonspecific, but most commonly related to chronic microvascular ischemic disease. Electronically Signed   By:  Rise Mu M.D.   On: 06/15/2023 03:26   CT ABDOMEN PELVIS W CONTRAST  Result Date: 06/14/2023 CLINICAL DATA:  Acute nonlocalized abdominal pain EXAM: CT ABDOMEN AND PELVIS WITH CONTRAST TECHNIQUE: Multidetector CT imaging of the abdomen and pelvis was performed using the standard protocol following bolus administration of intravenous contrast. RADIATION DOSE REDUCTION: This exam was performed according to the departmental dose-optimization program which includes automated exposure control, adjustment of the mA and/or kV according to patient size and/or use of iterative reconstruction technique. CONTRAST:  75mL OMNIPAQUE IOHEXOL 300 MG/ML  SOLN COMPARISON:  None Available. FINDINGS: Lower chest: No acute abnormality. Hepatobiliary: No focal liver abnormality is seen. No gallstones, gallbladder wall thickening, or biliary dilatation. Pancreas: Unremarkable Spleen: Unremarkable Adrenals/Urinary Tract: The adrenal glands are unremarkable. The kidneys are normal in size and position. Cortical hypodensities within the kidneys bilaterally are not well characterized due to motion artifact, however, these appear grossly stable since prior examination and were compatible with simple cortical cysts at that time. No follow-up imaging is recommended for these lesions. The kidneys are otherwise unremarkable. Bladder is unremarkable. Stomach/Bowel: Stomach is within normal limits. The appendix is not clearly identified, however, no secondary signs of appendicitis are identified within the right quadrant. No evidence of bowel wall thickening, distention, or inflammatory changes. Vascular/Lymphatic: No significant vascular findings are present. No enlarged abdominal or pelvic lymph nodes. Reproductive: Uterus and bilateral adnexa are unremarkable. Other: No abdominal wall hernia or abnormality. No abdominopelvic ascites. Musculoskeletal: No acute or significant osseous findings. IMPRESSION: 1. No acute intra-abdominal  pathology identified. No definite radiographic explanation for the patient's reported symptoms. Electronically Signed   By: Helyn Numbers M.D.   On: 06/14/2023 22:48   CT Head Wo Contrast  Result Date: 06/14/2023 CLINICAL DATA:  Recent intracranial hemorrhage, progressive headache EXAM: CT HEAD WITHOUT CONTRAST TECHNIQUE: Contiguous axial images were obtained from the base of the skull through the vertex without intravenous contrast. RADIATION DOSE REDUCTION: This exam was performed according to the departmental dose-optimization program which includes automated exposure control, adjustment of the mA and/or kV according to patient size and/or use of iterative reconstruction technique. COMPARISON:  None Available. FINDINGS: Brain: There is been interval evolution of the intraparenchymal hematoma within the right caudate head which demonstrates decreasing hyperdensity, decreasing surrounding cytotoxic edema, and now measures roughly 6 x 18 mm in size. Previously noted intraventricular hemorrhage has resolved. Minimal associated mass effect related to the intraparenchymal hematoma without associated midline shift. No interval hemorrhage. Ventricular size is normal. Cerebellum is unremarkable. Vascular: No hyperdense vessel or unexpected calcification. Skull: Normal. Negative for fracture or focal lesion. Sinuses/Orbits: No acute finding. Other: Mastoid air cells and middle ear cavities are clear. IMPRESSION: 1. Interval evolution of intraparenchymal hematoma within the right caudate head with decreasing hyperdensity, decreasing surrounding cytotoxic edema, and resolution of intraventricular hemorrhage. Minimal associated mass effect without associated midline shift. No interval hemorrhage. Electronically Signed  By: Helyn Numbers M.D.   On: 06/14/2023 22:41   DG Chest Port 1 View  Result Date: 06/14/2023 CLINICAL DATA:  Weakness. EXAM: PORTABLE CHEST 1 VIEW COMPARISON:  Radiograph 06/27/2019 FINDINGS: The  cardiomediastinal contours are normal. The lungs are clear. Pulmonary vasculature is normal. No consolidation, pleural effusion, or pneumothorax. No acute osseous abnormalities are seen. IMPRESSION: No acute chest findings. Electronically Signed   By: Narda Rutherford M.D.   On: 06/14/2023 22:34    Disposition Plan & Communication  Patient status: Inpatient  Admitted From: Home Planned disposition location: Home Anticipated discharge date: 10/12 pending culture results  Family Communication: daughter on phone    Author: Leeroy Bock, DO [facility] 06/16/2023, 7:29 AM   Available by Epic secure chat 7AM-7PM. If 7PM-7AM, please contact night-coverage.  [facility] contact information found on [email or web address].

## 2023-06-16 NOTE — Plan of Care (Signed)
  Problem: Intracerebral Hemorrhage Tissue Perfusion: Goal: Complications of Intracerebral Hemorrhage will be minimized Outcome: Progressing   Problem: Health Behavior/Discharge Planning: Goal: Ability to manage health-related needs will improve Outcome: Progressing Goal: Goals will be collaboratively established with patient/family Outcome: Progressing   Problem: Nutrition: Goal: Risk of aspiration will decrease Outcome: Progressing   Problem: Metabolic: Goal: Ability to maintain appropriate glucose levels will improve Outcome: Progressing   Problem: Nutritional: Goal: Maintenance of adequate nutrition will improve Outcome: Progressing

## 2023-06-16 NOTE — TOC Progression Note (Signed)
Transition of Care [facility]) - Progression Note    Patient Details  Name: Ariana Jacobs MRN: [id number] Date of Birth: Jan 31, 1966  Transition of Care [facility]) CM/SW Contact  Garret Reddish, RN Phone Number: 06/16/2023, 6:27 PM  Clinical Narrative:     Informed by Jonny Ruiz with [employer] that [facility] and Shower chair have been delivered to bedside. John reports insurance did cover both items.    Expected Discharge Plan: Home w Home Health Services Barriers to Discharge: No Barriers Identified  Expected Discharge Plan and Services   Discharge Planning Services: CM Consult Post Acute Care Choice: Home Health Living arrangements for the past 2 months: Single Family Home                 DME Arranged: Bedside commode Forensic scientist chair) DME Agency: [facility] Date DME Agency Contacted: 06/16/23 Time DME Agency Contacted: 1500 Representative spoke with at DME Agency: Jonny Ruiz HH Arranged: PT, OT HH Agency: [facility] Date [facility] Agency Contacted: 06/16/23 Time HH Agency Contacted: 1500 Representative spoke with at [facility] Agency: Elnita Maxwell   Social Determinants of Health (SDOH) Interventions SDOH Screenings   Food Insecurity: No Food Insecurity (06/15/2023)  Housing: Low Risk  (06/15/2023)  Transportation Needs: Unmet Transportation Needs (06/15/2023)  Utilities: Not At Risk (06/15/2023)  Tobacco Use: Low Risk  (06/14/2023)    Readmission Risk Interventions     No data to display

## 2023-06-16 NOTE — TOC Initial Note (Signed)
Transition of Care [facility]) - Initial/Assessment Note    Patient Details  Name: Ariana Jacobs MRN: [id number] Date of Birth: 13-Jan-1966  Transition of Care [facility]) CM/SW Contact:    Garret Reddish, RN Phone Number: 06/16/2023, 3:38 PM  Clinical Narrative:                  Chart reviewed. Noted that patient was admitted with Acute nonintractable headache.  Noted MRI showed post-stroke evolution. Noted that patient was previously admitted for a  recent hemorrhagic stroke.  I have spoken with patient.  She reported that prior to admission she lived a long by herself.  She reports that she has a supportive daughter.  She also have supportive brothers and sisters that assist when they are able too.  Patient reports that prior to admission she was able to drive.  She states that she was walking without any assistive devices.  I have informed patient of the PT/OT recommendations for home health.  Patient was agreeable.  Patient informed me that Home Health services were arranged with [facility] on her last admission but she was readmitted prior to the Home Health agency being able to start.  She would like to continue their services on discharge.   I have Elnita Maxwell with [facility] to provide home health services on discharge.  Elnita Maxwell informs me that patient has a 20% co-pay for home health services that is billed until her deductible of 9000 dollars is meet.  Elnita Maxwell reports that with the recent hospital stays patient may have meet the deductible.  Elnita Maxwell will follow up with patient if she will be required to pay 20% co-pay which is billed.  I have made patient aware of this information.    I have made patient aware that OT has recommended BSC.  Patient was agreeable to [facility] and would also like a shower chair.  I have made patient aware that insurance may not cover both items.  I have informed patient that [employer] will follow up with pricing if shower chair is not covered.    I have asked John with [employer] to  process orders for [facility] and shower chair.    Mrs. Austria reports medications are affordable.  She reports that her family will transport her home on discharge.    TOC will follow for discharge planning.     Expected Discharge Plan: Home w Home Health Services Barriers to Discharge: No Barriers Identified   Patient Goals and CMS Choice   CMS Medicare.gov Compare Post Acute Care list provided to:: Patient Choice offered to / list presented to : Patient      Expected Discharge Plan and Services   Discharge Planning Services: CM Consult Post Acute Care Choice: Home Health Living arrangements for the past 2 months: Single Family Home                 DME Arranged: Bedside commode Forensic scientist chair) DME Agency: [facility] Date DME Agency Contacted: 06/16/23 Time DME Agency Contacted: 1500 Representative spoke with at DME Agency: Jonny Ruiz HH Arranged: PT, OT HH Agency: [facility] Date [facility] Agency Contacted: 06/16/23 Time HH Agency Contacted: 1500 Representative spoke with at [facility] Agency: Elnita Maxwell  Prior Living Arrangements/Services Living arrangements for the past 2 months: Single Family Home Lives with:: Self Patient language and need for interpreter reviewed:: Yes Do you feel safe going back to the place where you live?: Yes      Need for Family Participation in Patient Care: Yes (Comment) (  Patient reports she has supportive brothers, sisters and a supportive daughter)   Current home services:  (Was arranged with HH on last admission but was readmitted prior to [facility] being able to see patient.)    Activities of Daily Living   ADL Screening (condition at time of admission) Independently performs ADLs?: No Does the patient have a NEW difficulty with bathing/dressing/toileting/self-feeding that is expected to last >3 days?: No Does the patient have a NEW difficulty with getting in/out of bed, walking, or climbing stairs that is expected to last >3 days?: No Does the  patient have a NEW difficulty with communication that is expected to last >3 days?: No Is the patient deaf or have difficulty hearing?: No Does the patient have difficulty seeing, even when wearing glasses/contacts?: No Does the patient have difficulty concentrating, remembering, or making decisions?: No  Permission Sought/Granted                  Emotional Assessment   Attitude/Demeanor/Rapport: Engaged Affect (typically observed): Appropriate Orientation: : Oriented to Self, Oriented to Place, Oriented to  Time, Oriented to Situation      Admission diagnosis:  UTI (urinary tract infection) [N39.0] Generalized weakness [R53.1] Nausea vomiting and diarrhea [R11.2, R19.7] Patient Active Problem List   Diagnosis Date Noted   Generalized weakness 06/15/2023   Nausea vomiting and diarrhea 06/15/2023   Sepsis secondary to UTI (HCC) 06/15/2023   UTI (urinary tract infection) 06/15/2023   Type 2 diabetes mellitus with hyperglycemia (HCC) 06/07/2023   Uncontrolled hypertension 06/07/2023   Hemorrhagic stroke (HCC) 06/05/2023   Acute nonintractable headache 06/04/2023   Acute right-sided low back pain with right-sided sciatica 06/04/2023   PCP:  No primary care provider on file. Pharmacy:   [facility] [address] [place] - [address] [place] [address] Phone: [phone] Fax: [phone]     Social Determinants of Health (SDOH) Social History: SDOH Screenings   Food Insecurity: No Food Insecurity (06/15/2023)  Housing: Low Risk  (06/15/2023)  Transportation Needs: Unmet Transportation Needs (06/15/2023)  Utilities: Not At Risk (06/15/2023)  Tobacco Use: Low Risk  (06/14/2023)   SDOH Interventions:     Readmission Risk Interventions     No data to display

## 2023-06-16 NOTE — Progress Notes (Signed)
Patient is not able to walk the distance required to go the bathroom, or he/she is unable to safely negotiate stairs required to access the bathroom.  A 3in1 BSC will alleviate this problem  

## 2023-06-16 NOTE — Evaluation (Signed)
Physical Therapy Evaluation Patient Details Name: Ariana Jacobs MRN: [id number] DOB: [birth year] Today's Date: 06/16/2023  History of Present Illness  56 y/o female presented to ED on 06/14/23 for weakness, headache, N/V, and diarrhea. Recent admission 9/29-10/4 for R hemorrhagic stroke. PMH: CVA, uncontrolled HTN, T2DM  Clinical Impression  Patient admitted with the above. PTA, patient lives alone and reports she was ambulating in the home with no AD after previous discharge. Patient presents with weakness, impaired cognition, impaired balance, and decreased activity tolerance. Ambulated 200' with supervision and no AD. Able to negotiate 4 stairs with B handrails, step to pattern, and supervision. Patient will benefit from skilled PT services during acute stay to address listed deficits. Patient will benefit from ongoing therapy at discharge to maximize functional independence and safety.         If plan is discharge home, recommend the following: A little help with walking and/or transfers;A little help with bathing/dressing/bathroom;Assist for transportation;Help with stairs or ramp for entrance   Can travel by private vehicle        Equipment Recommendations None recommended by PT  Recommendations for Other Services       Functional Status Assessment Patient has had a recent decline in their functional status and demonstrates the ability to make significant improvements in function in a reasonable and predictable amount of time.     Precautions / Restrictions Precautions Precautions: Fall Restrictions Weight Bearing Restrictions: No      Mobility  Bed Mobility Overal bed mobility: Modified Independent                  Transfers Overall transfer level: Needs assistance Equipment used: None Transfers: Sit to/from Stand Sit to Stand: Supervision                Ambulation/Gait Ambulation/Gait assistance: Supervision Gait Distance (Feet): 200  Feet Assistive device: None Gait Pattern/deviations: Step-through pattern, Drifts right/left Gait velocity: decreased     General Gait Details: drifting L/R throughout but no overt LOB  Stairs Stairs: Yes Stairs assistance: Supervision Stair Management: Two rails, Step to pattern, Forwards Number of Stairs: 4    Wheelchair Mobility     Tilt Bed    Modified Rankin (Stroke Patients Only)       Balance Overall balance assessment: Needs assistance Sitting-balance support: No upper extremity supported, Feet supported Sitting balance-Leahy Scale: Normal     Standing balance support: No upper extremity supported, During functional activity Standing balance-Leahy Scale: Good                               Pertinent Vitals/Pain Pain Assessment Pain Assessment: No/denies pain    Home Living Family/patient expects to be discharged to:: Private residence Living Arrangements: Alone Available Help at Discharge: Family;Available PRN/intermittently (daughter, sister) Type of Home: House Home Access: Level entry       Home Layout: One level Home Equipment: Agricultural consultant (2 wheels)      Prior Function Prior Level of Function : Independent/Modified Independent             Mobility Comments: pt reports amb with no AD after discharge ADLs Comments: pt reports genereally MOD I with ADL after discharge, assist with IADL while feeling generally unwell     Extremity/Trunk Assessment   Upper Extremity Assessment Upper Extremity Assessment: Defer to OT evaluation    Lower Extremity Assessment Lower Extremity Assessment: Generalized weakness    Cervical /  Trunk Assessment Cervical / Trunk Assessment: Normal  Communication   Communication Communication: No apparent difficulties Cueing Techniques: Verbal cues;Visual cues  Cognition Arousal: Alert Behavior During Therapy: Impulsive, WFL for tasks assessed/performed Overall Cognitive Status: No  family/caregiver present to determine baseline cognitive functioning Area of Impairment: Safety/judgement, Problem solving                         Safety/Judgement: Decreased awareness of safety Awareness: Emergent Problem Solving: Requires verbal cues          General Comments General comments (skin integrity, edema, etc.): HR up to 119 bpm with mobility    Exercises     Assessment/Plan    PT Assessment Patient needs continued PT services  PT Problem List Decreased strength;Decreased mobility;Decreased activity tolerance;Decreased balance;Pain       PT Treatment Interventions DME instruction;Gait training;Functional mobility training;Stair training;Therapeutic activities;Therapeutic exercise;Balance training    PT Goals (Current goals can be found in the Care Plan section)  Acute Rehab PT Goals Patient Stated Goal: to go home PT Goal Formulation: With patient Time For Goal Achievement: 06/30/23 Potential to Achieve Goals: Good    Frequency Min 1X/week     Co-evaluation               AM-PAC PT "6 Clicks" Mobility  Outcome Measure Help needed turning from your back to your side while in a flat bed without using bedrails?: None Help needed moving from lying on your back to sitting on the side of a flat bed without using bedrails?: None Help needed moving to and from a bed to a chair (including a wheelchair)?: A Little Help needed standing up from a chair using your arms (e.g., wheelchair or bedside chair)?: A Little Help needed to walk in hospital room?: A Little Help needed climbing 3-5 steps with a railing? : A Little 6 Click Score: 20    End of Session   Activity Tolerance: Patient tolerated treatment well Patient left: in bed;with call bell/phone within reach;with bed alarm set Nurse Communication: Mobility status PT Visit Diagnosis: Unsteadiness on feet (R26.81);Muscle weakness (generalized) (M62.81)    Time: 0911-0920 PT Time Calculation  (min) (ACUTE ONLY): 9 min   Charges:   PT Evaluation $PT Eval Low Complexity: 1 Low   PT General Charges $$ ACUTE PT VISIT: 1 Visit         Maylon Peppers, PT, DPT Physical Therapist - [facility]  [facility]   Jonathin Heinicke A Shanele Nissan 06/16/2023, 12:55 PM

## 2023-06-16 NOTE — Evaluation (Signed)
Occupational Therapy Evaluation Patient Details Name: Ariana Jacobs MRN: [id number] DOB: Nov 25, 1965 Today's Date: 06/16/2023   History of Present Illness Pt is a 57 year old female complaints of headache, nausea, vomiting, diarrhea, and generalized weakness       PMH significant for diabetes mellitus 2, hypertension, intracranial hemorrhage admission and discharged on October 4 after hemorrhagic stroke   Clinical Impression   Chart reviewed, pt greeted standing at sink with NT, alert and oriented x4, agreeable to OT evaluation. Pt presents with ?safety awareness deficits however nurse reports following evaluation pt waited for staff to amb to bathroom. PTA pt reports she has been requiring assist for IADLs since previous admission due to not feeling well. pt amb with and without RW with supervision, toilet transfer with supervision (and use of grab bar), LB dressing with supervision, standing grooming tasks with supervision. Pt presents with deficits in cognition, balance, activity tolerance affecting safe and optimal ADL completion.  Pt will benefit from ongoing OT to address deficits and to facilitate optimal ADL completion.       If plan is discharge home, recommend the following: A little help with walking and/or transfers;A little help with bathing/dressing/bathroom;Direct supervision/assist for medications management    Functional Status Assessment  Patient has had a recent decline in their functional status and demonstrates the ability to make significant improvements in function in a reasonable and predictable amount of time.  Equipment Recommendations  BSC/3in1    Recommendations for Other Services       Precautions / Restrictions Precautions Precautions: Fall Restrictions Weight Bearing Restrictions: No      Mobility Bed Mobility               General bed mobility comments: NT standing at sink with NT pre session/with PT post session    Transfers Overall  transfer level: Needs assistance Equipment used: Rolling walker (2 wheels), None Transfers: Sit to/from Stand Sit to Stand: Supervision                  Balance Overall balance assessment: Needs assistance Sitting-balance support: No upper extremity supported, Feet supported Sitting balance-Leahy Scale: Normal     Standing balance support: No upper extremity supported, During functional activity Standing balance-Leahy Scale: Good                             ADL either performed or assessed with clinical judgement   ADL Overall ADL's : Needs assistance/impaired     Grooming: Wash/dry hands;Supervision/safety;Standing Grooming Details (indicate cue type and reason): at sink level, no AD             Lower Body Dressing: Supervision/safety;Sit to/from stand Lower Body Dressing Details (indicate cue type and reason): underewar Toilet Transfer: Supervision/safety;Ambulation;Regular Toilet;Grab bars   Toileting- Clothing Manipulation and Hygiene: Supervision/safety;Sit to/from stand       Functional mobility during ADLs: Supervision/safety       Vision Patient Visual Report: No change from baseline Additional Comments: no reported HA     Perception         Praxis         Pertinent Vitals/Pain Pain Assessment Pain Assessment: No/denies pain     Extremity/Trunk Assessment Upper Extremity Assessment Upper Extremity Assessment: Overall WFL for tasks assessed   Lower Extremity Assessment Lower Extremity Assessment: Generalized weakness   Cervical / Trunk Assessment Cervical / Trunk Assessment: Normal   Communication Communication Communication: No apparent difficulties Cueing  Techniques: Verbal cues;Visual cues   Cognition   Behavior During Therapy: Impulsive, WFL for tasks assessed/performed Overall Cognitive Status: No family/caregiver present to determine baseline cognitive functioning Area of Impairment: Safety/judgement, Problem  solving                         Safety/Judgement: Decreased awareness of safety Awareness: Emergent Problem Solving: Requires verbal cues       General Comments  HR up to 119 bpm with mobility    Exercises Other Exercises Other Exercises: edu re: role of OT, role of rehab, discharge recommendations   Shoulder Instructions      Home Living Family/patient expects to be discharged to:: Private residence Living Arrangements: Alone Available Help at Discharge: Family;Available PRN/intermittently (daughter, sister) Type of Home: House Home Access: Level entry     Home Layout: One level     Bathroom Shower/Tub: Producer, television/film/video: Standard     Home Equipment: Agricultural consultant (2 wheels)          Prior Functioning/Environment Prior Level of Function : Independent/Modified Independent             Mobility Comments: pt reports amb with no AD after discharge ADLs Comments: pt reports genereally MOD I with ADL after discharge, assist with IADL while feeling generally unwell        OT Problem List: Decreased activity tolerance;Impaired balance (sitting and/or standing);Decreased safety awareness      OT Treatment/Interventions: Self-care/ADL training;Therapeutic exercise;Energy conservation;DME and/or AE instruction;Therapeutic activities;Patient/family education;Balance training;Visual/perceptual remediation/compensation    OT Goals(Current goals can be found in the care plan section) Acute Rehab OT Goals Patient Stated Goal: go home OT Goal Formulation: With patient Time For Goal Achievement: 06/30/23 Potential to Achieve Goals: Good ADL Goals Pt Will Perform Grooming: with modified independence;standing Pt Will Perform Lower Body Dressing: with modified independence;sit to/from stand Pt Will Transfer to Toilet: with modified independence;ambulating;regular height toilet Pt Will Perform Toileting - Clothing Manipulation and hygiene: with  modified independence;sit to/from stand  OT Frequency: Min 1X/week    Co-evaluation              AM-PAC OT "6 Clicks" Daily Activity     Outcome Measure Help from another person eating meals?: None Help from another person taking care of personal grooming?: None Help from another person toileting, which includes using toliet, bedpan, or urinal?: None Help from another person bathing (including washing, rinsing, drying)?: A Little Help from another person to put on and taking off regular upper body clothing?: None Help from another person to put on and taking off regular lower body clothing?: None 6 Click Score: 23   End of Session Equipment Utilized During Treatment: Rolling walker (2 wheels) Nurse Communication: Mobility status  Activity Tolerance: Patient tolerated treatment well Patient left: Other (comment) (in care of PT amb in hallway, all needs met)  OT Visit Diagnosis: Other abnormalities of gait and mobility (R26.89);Muscle weakness (generalized) (M62.81)                Time: 1610-9604 OT Time Calculation (min): 8 min Charges:  OT General Charges $OT Visit: 1 Visit OT Evaluation $OT Eval Low Complexity: 1 Low  Oleta Mouse, OTD OTR/L  06/16/23, 10:44 AM

## 2023-06-17 DIAGNOSIS — N3000 Acute cystitis without hematuria: Secondary | ICD-10-CM

## 2023-06-17 DIAGNOSIS — I619 Nontraumatic intracerebral hemorrhage, unspecified: Secondary | ICD-10-CM | POA: Diagnosis not present

## 2023-06-17 DIAGNOSIS — R519 Headache, unspecified: Secondary | ICD-10-CM | POA: Diagnosis not present

## 2023-06-17 DIAGNOSIS — R531 Weakness: Secondary | ICD-10-CM | POA: Diagnosis not present

## 2023-06-17 LAB — BASIC METABOLIC PANEL
Anion gap: 9 (ref 5–15)
BUN: 20 mg/dL (ref 6–20)
CO2: 28 mmol/L (ref 22–32)
Calcium: 9 mg/dL (ref 8.9–10.3)
Chloride: 99 mmol/L (ref 98–111)
Creatinine, Ser: 0.58 mg/dL (ref 0.44–1.00)
GFR, Estimated: >60 mL/min (ref >60)
Glucose, Bld: 250 mg/dL — ABNORMAL HIGH (ref 70–99)
Potassium: 3.3 mmol/L — ABNORMAL LOW (ref 3.5–5.1)
Sodium: 136 mmol/L (ref 135–145)

## 2023-06-17 LAB — CBC
HCT: 29 % — ABNORMAL LOW (ref 36.0–46.0)
Hemoglobin: 9.9 g/dL — ABNORMAL LOW (ref 12.0–15.0)
MCH: 30.2 pg (ref 26.0–34.0)
MCHC: 34.1 g/dL (ref 30.0–36.0)
MCV: 88.4 fL (ref 80.0–100.0)
Platelets: 253 10*3/uL (ref 150–400)
RBC: 3.28 MIL/uL — ABNORMAL LOW (ref 3.87–5.11)
RDW: 13 % (ref 11.5–15.5)
WBC: 14.1 10*3/uL — ABNORMAL HIGH (ref 4.0–10.5)
nRBC: 0 % (ref 0.0–0.2)

## 2023-06-17 LAB — URINE CULTURE: Culture: >=100000 — AB

## 2023-06-17 LAB — GLUCOSE, CAPILLARY
Glucose-Capillary: 261 mg/dL — ABNORMAL HIGH (ref 70–99)
Glucose-Capillary: 319 mg/dL — ABNORMAL HIGH (ref 70–99)
Glucose-Capillary: 294 mg/dL — ABNORMAL HIGH (ref 70–99)

## 2023-06-17 MED ORDER — INFLUENZA VIRUS VACC SPLIT PF (FLUZONE) 0.5 ML IM SUSY: 0.5000 mL | PREFILLED_SYRINGE | Freq: Once | INTRAMUSCULAR | Status: DC

## 2023-06-17 MED ORDER — METOPROLOL TARTRATE 25 MG PO TABS
12.5000 mg | ORAL_TABLET | Freq: Two times a day (BID) | ORAL | 0 refills | Status: AC
Start: 2023-06-17 — End: 2023-07-17

## 2023-06-17 MED ORDER — AMLODIPINE BESYLATE 5 MG PO TABS: 5.0000 mg | ORAL_TABLET | Freq: Every day | ORAL | 0 refills | Status: AC

## 2023-06-17 MED ORDER — CEPHALEXIN 500 MG PO CAPS: 500.0000 mg | ORAL_CAPSULE | Freq: Two times a day (BID) | ORAL | 0 refills | Status: AC

## 2023-06-17 MED ORDER — ACETAMINOPHEN 325 MG PO TABS: 650.0000 mg | ORAL_TABLET | Freq: Four times a day (QID) | ORAL | 0 refills | Status: AC | PRN

## 2023-06-17 MED ORDER — INFLUENZA VIRUS VACC SPLIT PF (FLUZONE) 0.5 ML IM SUSY: 0.5000 mL | PREFILLED_SYRINGE | INTRAMUSCULAR | Status: DC

## 2023-06-17 MED ORDER — LANTUS SOLOSTAR 100 UNIT/ML ~~LOC~~ SOPN: 25.0000 [IU] | PEN_INJECTOR | Freq: Every day | SUBCUTANEOUS | 0 refills | Status: AC

## 2023-06-17 MED ORDER — PROSIGHT PO TABS
1.0000 | ORAL_TABLET | Freq: Every day | ORAL | Status: DC
  Administered 2023-06-17: 1 via ORAL
  Filled 2023-06-17: qty 1

## 2023-06-17 MED ORDER — GABAPENTIN (ONCE-DAILY) 300 MG PO TABS: 300.0000 mg | ORAL_TABLET | Freq: Every day | ORAL | 0 refills | Status: DC

## 2023-06-17 MED ORDER — FLEET ENEMA RE ENEM
1.0000 | ENEMA | Freq: Once | RECTAL | Status: AC
  Administered 2023-06-17: 1 via RECTAL

## 2023-06-17 NOTE — Progress Notes (Signed)
   06/17/23 1906  AVS Discharge Documentation  AVS Discharge Instructions Including Medications Provided to patient/caregiver  Name of Person Receiving AVS Discharge Instructions Including Medications Ariana Jacobs  Name of Clinician That Reviewed AVS Discharge Instructions Including Medications Erskine Squibb

## 2023-06-17 NOTE — Plan of Care (Signed)
  Problem: Education: Goal: Knowledge of disease or condition will improve Outcome: Progressing Goal: Knowledge of secondary prevention will improve (MUST DOCUMENT ALL) Outcome: Progressing Goal: Knowledge of patient specific risk factors will improve Loraine Leriche N/A or DELETE if not current risk factor) Outcome: Progressing   Problem: Intracerebral Hemorrhage Tissue Perfusion: Goal: Complications of Intracerebral Hemorrhage will be minimized Outcome: Progressing   Problem: Coping: Goal: Will verbalize positive feelings about self Outcome: Progressing Goal: Will identify appropriate support needs Outcome: Progressing   Problem: Health Behavior/Discharge Planning: Goal: Ability to manage health-related needs will improve Outcome: Progressing   Problem: Self-Care: Goal: Ability to communicate needs accurately will improve Outcome: Progressing   Problem: Nutrition: Goal: Risk of aspiration will decrease Outcome: Progressing Goal: Dietary intake will improve Outcome: Progressing   Problem: Coping: Goal: Ability to adjust to condition or change in health will improve Outcome: Progressing   Problem: Fluid Volume: Goal: Ability to maintain a balanced intake and output will improve Outcome: Progressing   Problem: Health Behavior/Discharge Planning: Goal: Ability to identify and utilize available resources and services will improve Outcome: Progressing Goal: Ability to manage health-related needs will improve Outcome: Progressing   Problem: Metabolic: Goal: Ability to maintain appropriate glucose levels will improve Outcome: Progressing   Problem: Nutritional: Goal: Maintenance of adequate nutrition will improve Outcome: Progressing

## 2023-06-17 NOTE — Plan of Care (Signed)
  Problem: Education: Goal: Knowledge of disease or condition will improve Outcome: Progressing Goal: Knowledge of secondary prevention will improve (MUST DOCUMENT ALL) Outcome: Progressing Goal: Knowledge of patient specific risk factors will improve Ariana Jacobs N/A or DELETE if not current risk factor) Outcome: Progressing   Problem: Intracerebral Hemorrhage Tissue Perfusion: Goal: Complications of Intracerebral Hemorrhage will be minimized Outcome: Progressing   Problem: Coping: Goal: Will verbalize positive feelings about self Outcome: Progressing Goal: Will identify appropriate support needs Outcome: Progressing   Problem: Health Behavior/Discharge Planning: Goal: Ability to manage health-related needs will improve Outcome: Progressing Goal: Goals will be collaboratively established with patient/family Outcome: Progressing   Problem: Self-Care: Goal: Ability to participate in self-care as condition permits will improve Outcome: Progressing Goal: Verbalization of feelings and concerns over difficulty with self-care will improve Outcome: Progressing Goal: Ability to communicate needs accurately will improve Outcome: Progressing   Problem: Nutrition: Goal: Risk of aspiration will decrease Outcome: Progressing Goal: Dietary intake will improve Outcome: Progressing   Problem: Education: Goal: Ability to describe self-care measures that may prevent or decrease complications (Diabetes Survival Skills Education) will improve Outcome: Progressing Goal: Individualized Educational Video(s) Outcome: Progressing   Problem: Coping: Goal: Ability to adjust to condition or change in health will improve Outcome: Progressing   Problem: Fluid Volume: Goal: Ability to maintain a balanced intake and output will improve Outcome: Progressing   Problem: Health Behavior/Discharge Planning: Goal: Ability to identify and utilize available resources and services will improve Outcome:  Progressing Goal: Ability to manage health-related needs will improve Outcome: Progressing   Problem: Metabolic: Goal: Ability to maintain appropriate glucose levels will improve Outcome: Progressing   Problem: Nutritional: Goal: Maintenance of adequate nutrition will improve Outcome: Progressing Goal: Progress toward achieving an optimal weight will improve Outcome: Progressing   Problem: Education: Goal: Knowledge of General Education information will improve Description: Including pain rating scale, medication(s)/side effects and non-pharmacologic comfort measures Outcome: Progressing   Problem: Health Behavior/Discharge Planning: Goal: Ability to manage health-related needs will improve Outcome: Progressing   Problem: Clinical Measurements: Goal: Ability to maintain clinical measurements within normal limits will improve Outcome: Progressing Goal: Will remain free from infection Outcome: Progressing Goal: Diagnostic test results will improve Outcome: Progressing Goal: Respiratory complications will improve Outcome: Progressing Goal: Cardiovascular complication will be avoided Outcome: Progressing   Problem: Safety: Goal: Ability to remain free from injury will improve Outcome: Progressing

## 2023-06-17 NOTE — Progress Notes (Incomplete)
PROGRESS NOTE  Ariana Jacobs    DOB: [birth year], 57 y.o.  VHQ:[id number]    Code Status: Prior   DOA: 06/14/2023   LOS: 2  Brief hospital course  Ariana Jacobs is a 57 y.o. female with medical history significant for diabetes mellitus 2, hypertension, recent hemorrhagic stroke managed medically.  They presented to the hospital with headache, nausea, vomiting.   In ED alert awake oriented keeps her eyes closed for her headache but otherwise is nonfocal follows commands.   Na+ 132 glucose 352 normal kidney function normal LFTs.  Initial lactic of 2.2 which has resolved to 1 after IV fluids.  CBC shows leukocytosis of 11.8 10.8 hemoglobin and normal platelets.  Respiratory panel is negative for flu RSV and COVID. Urinalysis is abnormal and consistent with UTI.  Culture collected EKG: sinus tach at 113 right axis deviation and no ST-T wave changes.  Initial chest x-ray is negative for any focal consolidation.   Neuroimaging head CT noncontrast shows Interval evolution of intraparenchymal hematoma within the right  caudate head with decreasing hyperdensity, decreasing surrounding  cytotoxic edema, and resolution of intraventricular hemorrhage.  Minimal associated mass effect without associated midline shift. No  interval hemorrhage.  Patient also had CT of the abdomen and pelvis which was negative for any acute intra abdominal pathology.  They were initially treated with analgesia and anti-emetics. Neurology was consulted.   Patient was admitted to medicine service for further workup and management of post-stroke headache as outlined in detail below.  06/17/23 -stable  Assessment & Plan  Principal Problem:   Acute nonintractable headache Active Problems:   Hemorrhagic stroke (HCC)   Type 2 diabetes mellitus with hyperglycemia (HCC)   Uncontrolled hypertension   Generalized weakness   Nausea vomiting and diarrhea   Sepsis secondary to UTI [facility])   UTI (urinary tract  infection)  Hemorrhagic stroke [facility])- neurology consulted and did not recommend further workup after MRI showed stable post-stroke evolution.  - will need CT follow-up per neurology recs   Acute nonintractable headache- resolved. Multifactorial in setting of recent hemorrhagic stroke, dehydration from illness.  - analgesia PRN   Sepsis secondary to UTI [facility])- afebrile about 24 hours now.  - continue rocephin - follow cultures and de-escalate Abx as able   Nausea vomiting and diarrhea- resolved.    Hypertension-well controlled - continue home amlodipine, metoprolol Lisinopril/ hydrochlorothiazide held and hydralazine held.    Type 2 diabetes mellitus with hyperglycemia (HCC) - semglee - sSSI  There is no height or weight on file to calculate BMI.  VTE ppx: Place and maintain sequential compression device Start: 06/15/23 1605  Diet:     Diet   Diet Carb Modified Fluid consistency: Thin; Room service appropriate? Yes   Consultants: Neurology   Subjective 06/17/23    ***   Objective   Vitals:   06/16/23 0752 06/16/23 1702 06/16/23 2104 06/17/23 0533  BP: 133/76 131/85 136/72 117/70  Pulse: 95 97 99 90  Resp: 19 17 16 18   Temp: 98.2 F (36.8 C) 98.6 F (37 C) 98.1 F (36.7 C) 98.4 F (36.9 C)  TempSrc:   Oral Oral  SpO2: 98% 100% 100% 98%   No intake or output data in the 24 hours ending 06/17/23 0740 There were no vitals filed for this visit.   Physical Exam:  General: awake, alert, NAD HEENT: atraumatic, clear conjunctiva, anicteric sclera, MMM, hearing grossly normal Respiratory: normal respiratory effort. Cardiovascular: quick capillary refill, normal S1/S2, RRR, no JVD,  murmurs Gastrointestinal: soft, NT, ND Nervous: A&O x3. no gross focal neurologic deficits, normal speech Extremities: moves all equally, no edema, normal tone Skin: dry, intact, normal temperature, normal color. No rashes, lesions or ulcers on exposed skin Psychiatry: normal mood,  congruent affect  Labs   I have personally reviewed the following labs and imaging studies CBC    Component Value Date/Time   WBC 14.1 (H) 06/17/2023 0456   RBC 3.28 (L) 06/17/2023 0456   HGB 9.9 (L) 06/17/2023 0456   HGB 11.9 (L) 02/26/2013 1028   HCT 29.0 (L) 06/17/2023 0456   HCT 35.0 02/26/2013 1028   PLT 253 06/17/2023 0456   PLT 252 02/26/2013 1028   MCV 88.4 06/17/2023 0456   MCV 91 02/26/2013 1028   MCH 30.2 06/17/2023 0456   MCHC 34.1 06/17/2023 0456   RDW 13.0 06/17/2023 0456   RDW 12.6 02/26/2013 1028   LYMPHSABS 2.3 10/30/2018 1854   MONOABS 0.5 10/30/2018 1854   EOSABS 0.1 10/30/2018 1854   BASOSABS 0.0 10/30/2018 1854      Latest Ref Rng & Units 06/17/2023    4:56 AM 06/16/2023    4:33 AM 06/14/2023    4:45 PM  BMP  Glucose 70 - 99 mg/dL 284  132  440   BUN 6 - 20 mg/dL 20  25  28    Creatinine 0.44 - 1.00 mg/dL 1.02  7.25  3.66   Sodium 135 - 145 mmol/L 136  136  132   Potassium 3.5 - 5.1 mmol/L 3.3  3.2  4.2   Chloride 98 - 111 mmol/L 99  96  94   CO2 22 - 32 mmol/L 28  27  24    Calcium 8.9 - 10.3 mg/dL 9.0  9.3  9.3     No results found.  Disposition Plan & Communication  Patient status: Inpatient  Admitted From: Home Planned disposition location: Home Anticipated discharge date: 10/12 pending culture results  Family Communication: daughter on phone    Author: Leeroy Bock, DO [facility] 06/17/2023, 7:40 AM   Available by Epic secure chat 7AM-7PM. If 7PM-7AM, please contact night-coverage.  [facility] contact information found on [email or web address].

## 2023-06-17 NOTE — Discharge Instructions (Signed)
Please make a new PCP appointment at earliest available to discuss hospital follow up.  A neurology referral has been placed and you should be contacted to set up an appointment for follow up with them.

## 2023-06-17 NOTE — TOC Transition Note (Signed)
Transition of Care [facility]) - CM/SW Discharge Note   Patient Details  Name: Ariana Jacobs MRN: [id number] Date of Birth: 1965-11-10  Transition [facility]) CM/SW Contact:  Liliana Cline, LCSW Phone Number: 06/17/2023, 12:50 PM   Clinical Narrative:    Patient has orders to DC home today. CSW notified Elnita Maxwell with [facility].   Final next level of care: Home w Home Health Services Barriers to Discharge: Barriers Resolved   Patient Goals and CMS Choice CMS Medicare.gov Compare Post Acute Care list provided to:: Patient Choice offered to / list presented to : Patient  Discharge Placement                         Discharge Plan and Services Additional resources added to the After Visit Summary for     Discharge Planning Services: CM Consult Post Acute Care Choice: Home Health          DME Arranged: Bedside commode Forensic scientist chair) DME Agency: [employer] Date DME Agency Contacted: 06/16/23 Time DME Agency Contacted: 1500 Representative spoke with at DME Agency: Jonny Ruiz HH Arranged: PT, OT HH Agency: [facility] Date [facility] Agency Contacted: 06/17/23 Time HH Agency Contacted: 1500 Representative spoke with at [facility] Agency: Elnita Maxwell  Social Determinants of Health (SDOH) Interventions SDOH Screenings   Food Insecurity: No Food Insecurity (06/15/2023)  Housing: Low Risk  (06/15/2023)  Transportation Needs: Unmet Transportation Needs (06/15/2023)  Utilities: Not At Risk (06/15/2023)  Tobacco Use: Low Risk  (06/14/2023)     Readmission Risk Interventions     No data to display

## 2023-06-19 NOTE — Discharge Summary (Signed)
Physician Discharge Summary  Patient: Ariana Jacobs WUJ:[id number] DOB: 1966/08/15   Code Status: Prior Admit date: 06/14/2023 Discharge date: 06/17/2023 Disposition: Home, PT and OT PCP: No primary care provider on file.  Recommendations for Outpatient Follow-up:  Follow up with PCP within 1-2 weeks Regarding general hospital follow up and preventative care Recommend    Discharge Diagnoses:  Principal Problem:   Acute nonintractable headache Active Problems:   Hemorrhagic stroke (HCC)   Type 2 diabetes mellitus with hyperglycemia (HCC)   Uncontrolled hypertension   Generalized weakness   Nausea vomiting and diarrhea   Sepsis secondary to UTI [facility])   UTI (urinary tract infection)  Brief Hospital Course Summary: Ariana Jacobs is a 57 y.o. female with medical history significant for diabetes mellitus 2, hypertension, recent hemorrhagic stroke managed medically.   They presented to the hospital with headache, nausea, vomiting.    In ED alert awake oriented keeps her eyes closed for her headache but otherwise is nonfocal follows commands.   Na+ 132 glucose 352 normal kidney function normal LFTs.  Initial lactic of 2.2 which has resolved to 1 after IV fluids.  CBC shows leukocytosis of 11.8 10.8 hemoglobin and normal platelets.  Respiratory panel is negative for flu RSV and COVID. Urinalysis: consistent with UTI.  Culture collected EKG: sinus tach at 113 right axis deviation and no ST-T wave changes.  Initial chest x-ray is negative for any focal consolidation.   head CT was consistent with appropriate healing stage and improvement of her previously seen hemorrhagic stroke.   CT of the abdomen and pelvis: negative acute intra abdominal pathology.   They were initially treated with analgesia and anti-emetics.   Neurology was consulted and did not recommend any additional neurological intervention or workup in addition to what was completed at her recent admission.   Her  headache was treated symptomatically and resolved. This was likely in relation to the recent stroke. Careful attention was paid to blood pressure control while she was inpatient and remained well controlled on the below regimen.   She was treated for UTI with IV Abx until her urine cultures resulted and then transitioned to PO keflex to complete her treatment at discharge.  She felt well and close to baseline at time of discharge. PT/OT evaluated and recommended HH therapy.   All other chronic conditions were treated with home medications.   Discharge Condition: Good, improved Recommended discharge diet: Regular healthy diet  Consultations: Neurology   Procedures/Studies: None   Discharge Instructions     Ambulatory referral to Neurology   Complete by: As directed    An appointment is requested in approximately: 1 week      Allergies as of 06/17/2023   No Known Allergies      Medication List     STOP taking these medications    baclofen 10 MG tablet Commonly known as: LIORESAL   omeprazole 20 MG capsule Commonly known as: PRILOSEC       TAKE these medications    acetaminophen 325 MG tablet Commonly known as: TYLENOL Take 2 tablets (650 mg total) by mouth every 6 (six) hours as needed for mild pain or headache (or temp > 37.5 C (99.5 F)).   amLODipine 5 MG tablet Commonly known as: NORVASC Take 1 tablet (5 mg total) by mouth daily. What changed:  medication strength how much to take   atorvastatin 40 MG tablet Commonly known as: LIPITOR Take 40 mg by mouth at bedtime.  Blood Glucose Monitoring Suppl Devi 1 each by Does not apply route in the morning, at noon, and at bedtime. May substitute to any manufacturer covered by patient's insurance.   BLOOD GLUCOSE TEST STRIPS Strp 1 each by In Vitro route in the morning, at noon, and at bedtime. May substitute to any manufacturer covered by patient's insurance.   cephALEXin 500 MG capsule Commonly known as:  KEFLEX Take 1 capsule (500 mg total) by mouth 2 (two) times daily for 3 days.   Gabapentin (Once-Daily) 300 MG Tabs Take 1 tablet (300 mg total) by mouth at bedtime.   Jardiance 25 MG Tabs tablet Generic drug: empagliflozin Take 25 mg by mouth daily.   Lancet Device Misc 1 each by Does not apply route in the morning, at noon, and at bedtime. May substitute to any manufacturer covered by patient's insurance.   Lancets Misc. Misc 1 each by Does not apply route in the morning, at noon, and at bedtime. May substitute to any manufacturer covered by patient's insurance.   Lantus SoloStar 100 UNIT/ML Solostar Pen Generic drug: insulin glargine Inject 25 Units into the skin daily. What changed:  how much to take when to take this   metFORMIN 500 MG 24 hr tablet Commonly known as: GLUCOPHAGE-XR Take 1,000 mg by mouth 2 (two) times daily.   metoprolol tartrate 25 MG tablet Commonly known as: LOPRESSOR Take 0.5 tablets (12.5 mg total) by mouth 2 (two) times daily.        Follow-up Information     PCP. Schedule an appointment as soon as possible for a visit.   Why: ASAP        neurology Follow up in 1 week(s).   Why: to follow up from stroke. They should contact you for setting up an appointment               Subjective   Pt reports feeling back to her baseline. She is tolerating a normal diet and ambulating independently. She denies headache.   All questions and concerns were addressed at time of discharge.  Objective  Blood pressure 120/76, pulse 90, temperature 98.4 F (36.9 C), resp. rate 16, SpO2 99%.   General: Pt is alert, awake, not in acute distress Cardiovascular: RRR, S1/S2 +, no rubs, no gallops Respiratory: CTA bilaterally, no wheezing, no rhonchi Abdominal: Soft, NT, ND, bowel sounds + Extremities: no edema, no cyanosis  The results of significant diagnostics from this hospitalization (including imaging, microbiology, ancillary and laboratory) are  listed below for reference.   Imaging studies: MR BRAIN W WO CONTRAST  Result Date: 06/15/2023 CLINICAL DATA:  Initial evaluation for acute headache. EXAM: MRI HEAD WITHOUT AND WITH CONTRAST TECHNIQUE: Multiplanar, multiecho pulse sequences of the brain and surrounding structures were obtained without and with intravenous contrast. CONTRAST:  5mL GADAVIST GADOBUTROL 1 MMOL/ML IV SOLN COMPARISON:  Prior CT from 06/14/2023 as well as earlier studies. FINDINGS: Brain: Cerebral volume within normal limits. Scattered subcentimeter foci of T2/FLAIR hyperintensity involving the supratentorial cerebral white matter, nonspecific, but most commonly related to chronic microvascular ischemic disease. Changes are mild for age. Previously identified hematoma positioned at the right caudate head/right lateral ventricle again seen, now measuring 8 x 8 x 8 mm (series 11, image 29). Minimal surrounding edema without significant regional mass effect or significant midline shift. Trace intraventricular extension with small amount of blood seen layering within the occipital horns of the lateral ventricles. No hydrocephalus. No visible underlying mass or abnormal enhancement. No other acute  intracranial hemorrhage few additional punctate chronic micro hemorrhages noted elsewhere, nonspecific. No acute or subacute infarct. No areas of chronic cortical infarction. No other mass lesion or mass effect. No extra-axial fluid collection. Pituitary gland is somewhat irregular and lobulated with inferior extension into the clivus (series 9, image 14). Suprasellar region within normal limits. No other abnormal enhancement. Vascular: Major intracranial vascular flow voids are maintained. Skull and upper cervical spine: Craniocervical junction within normal limits. Bone marrow signal intensity normal. No scalp soft tissue abnormality. Sinuses/Orbits: Globes orbital soft tissues within normal limits. Paranasal sinuses are largely clear. Trace  bilateral mastoid effusions, of doubtful significance. Other: Sequelae of prior right parotidectomy, partially visualized. IMPRESSION: 1. 8 x 8 x 8 mm hematoma centered at the right caudate head/right lateral ventricle, with trace intraventricular extension. No hydrocephalus. No visible underlying mass or abnormal enhancement. Continued CT follow-up to resolution recommended. 2. Irregular and lobulated pituitary gland with inferior extension into the clivus, indeterminate. Correlation with pituitary function tests suggested. Additionally, follow-up examination with nonemergent pituitary protocol mass MRI could be performed for further evaluation as warranted. 3. No other acute intracranial abnormality. 4. Underlying mild cerebral white matter disease, nonspecific, but most commonly related to chronic microvascular ischemic disease. Electronically Signed   By: Rise Mu M.D.   On: 06/15/2023 03:26   CT ABDOMEN PELVIS W CONTRAST  Result Date: 06/14/2023 CLINICAL DATA:  Acute nonlocalized abdominal pain EXAM: CT ABDOMEN AND PELVIS WITH CONTRAST TECHNIQUE: Multidetector CT imaging of the abdomen and pelvis was performed using the standard protocol following bolus administration of intravenous contrast. RADIATION DOSE REDUCTION: This exam was performed according to the departmental dose-optimization program which includes automated exposure control, adjustment of the mA and/or kV according to patient size and/or use of iterative reconstruction technique. CONTRAST:  75mL OMNIPAQUE IOHEXOL 300 MG/ML  SOLN COMPARISON:  None Available. FINDINGS: Lower chest: No acute abnormality. Hepatobiliary: No focal liver abnormality is seen. No gallstones, gallbladder wall thickening, or biliary dilatation. Pancreas: Unremarkable Spleen: Unremarkable Adrenals/Urinary Tract: The adrenal glands are unremarkable. The kidneys are normal in size and position. Cortical hypodensities within the kidneys bilaterally are not well  characterized due to motion artifact, however, these appear grossly stable since prior examination and were compatible with simple cortical cysts at that time. No follow-up imaging is recommended for these lesions. The kidneys are otherwise unremarkable. Bladder is unremarkable. Stomach/Bowel: Stomach is within normal limits. The appendix is not clearly identified, however, no secondary signs of appendicitis are identified within the right quadrant. No evidence of bowel wall thickening, distention, or inflammatory changes. Vascular/Lymphatic: No significant vascular findings are present. No enlarged abdominal or pelvic lymph nodes. Reproductive: Uterus and bilateral adnexa are unremarkable. Other: No abdominal wall hernia or abnormality. No abdominopelvic ascites. Musculoskeletal: No acute or significant osseous findings. IMPRESSION: 1. No acute intra-abdominal pathology identified. No definite radiographic explanation for the patient's reported symptoms. Electronically Signed   By: Helyn Numbers M.D.   On: 06/14/2023 22:48   CT Head Wo Contrast  Result Date: 06/14/2023 CLINICAL DATA:  Recent intracranial hemorrhage, progressive headache EXAM: CT HEAD WITHOUT CONTRAST TECHNIQUE: Contiguous axial images were obtained from the base of the skull through the vertex without intravenous contrast. RADIATION DOSE REDUCTION: This exam was performed according to the departmental dose-optimization program which includes automated exposure control, adjustment of the mA and/or kV according to patient size and/or use of iterative reconstruction technique. COMPARISON:  None Available. FINDINGS: Brain: There is been interval evolution of the intraparenchymal hematoma within  the right caudate head which demonstrates decreasing hyperdensity, decreasing surrounding cytotoxic edema, and now measures roughly 6 x 18 mm in size. Previously noted intraventricular hemorrhage has resolved. Minimal associated mass effect related to the  intraparenchymal hematoma without associated midline shift. No interval hemorrhage. Ventricular size is normal. Cerebellum is unremarkable. Vascular: No hyperdense vessel or unexpected calcification. Skull: Normal. Negative for fracture or focal lesion. Sinuses/Orbits: No acute finding. Other: Mastoid air cells and middle ear cavities are clear. IMPRESSION: 1. Interval evolution of intraparenchymal hematoma within the right caudate head with decreasing hyperdensity, decreasing surrounding cytotoxic edema, and resolution of intraventricular hemorrhage. Minimal associated mass effect without associated midline shift. No interval hemorrhage. Electronically Signed   By: Helyn Numbers M.D.   On: 06/14/2023 22:41   DG Chest Port 1 View  Result Date: 06/14/2023 CLINICAL DATA:  Weakness. EXAM: PORTABLE CHEST 1 VIEW COMPARISON:  Radiograph 06/27/2019 FINDINGS: The cardiomediastinal contours are normal. The lungs are clear. Pulmonary vasculature is normal. No consolidation, pleural effusion, or pneumothorax. No acute osseous abnormalities are seen. IMPRESSION: No acute chest findings. Electronically Signed   By: Narda Rutherford M.D.   On: 06/14/2023 22:34   ECHOCARDIOGRAM COMPLETE  Result Date: 06/07/2023    ECHOCARDIOGRAM REPORT   Patient Name:   LIANI CARIS Date of Exam: 06/07/2023 Medical Rec #:  [id number]        Height:       59.0 in Accession #:    [id number]       Weight:       112.7 lb Date of Birth:  02/06/1966       BSA:          1.445 m Patient Age:    56 years         BP:           154/80 mmHg Patient Gender: F                HR:           92 bpm. Exam Location:  [facility] Procedure: 2D Echo, Cardiac Doppler and Color Doppler Indications:     Stroke I63.9  History:         Patient has no prior history of Echocardiogram examinations.                  Risk Factors:Diabetes and Hypertension.  Sonographer:     Lucendia Herrlich RCS Referring Phys:  [phone] MCNEILL P KIRKPATRICK Diagnosing Phys: Julien Nordmann MD  IMPRESSIONS  1. Left ventricular ejection fraction, by estimation, is 60 to 65%. The left ventricle has normal function. The left ventricle has no regional wall motion abnormalities. There is moderate left ventricular hypertrophy. Left ventricular diastolic parameters are consistent with Grade I diastolic dysfunction (impaired relaxation).  2. Right ventricular systolic function is normal. The right ventricular size is normal. There is normal pulmonary artery systolic pressure. The estimated right ventricular systolic pressure is 34.4 mmHg.  3. The mitral valve is normal in structure. No evidence of mitral valve regurgitation. No evidence of mitral stenosis.  4. Tricuspid valve regurgitation is mild to moderate.  5. The aortic valve is tricuspid. Aortic valve regurgitation is not visualized. No aortic stenosis is present.  6. The inferior vena cava is normal in size with greater than 50% respiratory variability, suggesting right atrial pressure of 3 mmHg. FINDINGS  Left Ventricle: Left ventricular ejection fraction, by estimation, is 60 to 65%. The left ventricle has normal function. The left ventricle has  no regional wall motion abnormalities. The left ventricular internal cavity size was normal in size. There is  moderate left ventricular hypertrophy. Left ventricular diastolic parameters are consistent with Grade I diastolic dysfunction (impaired relaxation). Right Ventricle: The right ventricular size is normal. No increase in right ventricular wall thickness. Right ventricular systolic function is normal. There is normal pulmonary artery systolic pressure. The tricuspid regurgitant velocity is 2.71 m/s, and  with an assumed right atrial pressure of 5 mmHg, the estimated right ventricular systolic pressure is 34.4 mmHg. Left Atrium: Left atrial size was normal in size. Right Atrium: Right atrial size was normal in size. Pericardium: There is no evidence of pericardial effusion. Mitral Valve: The mitral valve is  normal in structure. No evidence of mitral valve regurgitation. No evidence of mitral valve stenosis. Tricuspid Valve: The tricuspid valve is normal in structure. Tricuspid valve regurgitation is mild to moderate. No evidence of tricuspid stenosis. Aortic Valve: The aortic valve is tricuspid. Aortic valve regurgitation is not visualized. No aortic stenosis is present. Aortic valve peak gradient measures 6.9 mmHg. Pulmonic Valve: The pulmonic valve was normal in structure. Pulmonic valve regurgitation is not visualized. No evidence of pulmonic stenosis. Aorta: The aortic root is normal in size and structure. Venous: The inferior vena cava is normal in size with greater than 50% respiratory variability, suggesting right atrial pressure of 3 mmHg. IAS/Shunts: No atrial level shunt detected by color flow Doppler.  LEFT VENTRICLE PLAX 2D LVIDd:         4.60 cm   Diastology LVIDs:         2.70 cm   LV e' medial:    7.40 cm/s LV PW:         0.80 cm   LV E/e' medial:  9.8 LV IVS:        0.80 cm   LV e' lateral:   10.20 cm/s LVOT diam:     2.00 cm   LV E/e' lateral: 7.1 LV SV:         50 LV SV Index:   35 LVOT Area:     3.14 cm  RIGHT VENTRICLE             IVC RV S prime:     15.30 cm/s  IVC diam: 0.60 cm TAPSE (M-mode): 1.7 cm LEFT ATRIUM             Index        RIGHT ATRIUM          Index LA diam:        2.90 cm 2.01 cm/m   RA Area:     9.07 cm LA Vol (A2C):   24.6 ml 17.02 ml/m  RA Volume:   17.40 ml 12.04 ml/m LA Vol (A4C):   37.1 ml 25.70 ml/m LA Biplane Vol: 30.5 ml 21.11 ml/m  AORTIC VALVE                 PULMONIC VALVE AV Area (Vmax): 2.29 cm     PR End Diast Vel: 5.66 msec AV Vmax:        131.00 cm/s AV Peak Grad:   6.9 mmHg LVOT Vmax:      95.30 cm/s LVOT Vmean:     61.600 cm/s LVOT VTI:       0.159 m  AORTA Ao Root diam: 3.00 cm Ao Asc diam:  3.10 cm MITRAL VALVE               TRICUSPID  VALVE MV Area (PHT): 4.71 cm    TR Peak grad:   29.4 mmHg MV Decel Time: 161 msec    TR Vmax:        271.00 cm/s MV E  velocity: 72.60 cm/s MV A velocity: 90.70 cm/s  SHUNTS MV E/A ratio:  0.80        Systemic VTI:  0.16 m                            Systemic Diam: 2.00 cm Julien Nordmann MD Electronically signed by Julien Nordmann MD Signature Date/Time: 06/07/2023/2:15:50 PM    Final    CT HEAD WO CONTRAST ( )  Result Date: 06/07/2023 CLINICAL DATA:  hemorrhage follow-up EXAM: CT HEAD WITHOUT CONTRAST TECHNIQUE: Contiguous axial images were obtained from the base of the skull through the vertex without intravenous contrast. RADIATION DOSE REDUCTION: This exam was performed according to the departmental dose-optimization program which includes automated exposure control, adjustment of the mA and/or kV according to patient size and/or use of iterative reconstruction technique. COMPARISON:  06/06/2023 at 7:46 a.m. FINDINGS: Brain: Redemonstration of intraventricular hemorrhage with small amount of blood in the right lateral ventricle, third ventricle, both occipital horns and the fourth ventricle. No new site of hemorrhage. No midline shift or other mass effect. There is periventricular hypoattenuation compatible with chronic microvascular disease. Unchanged size and configuration of the ventricles without hydrocephalus. The intraparenchymal hemorrhage component in the right caudate head is unchanged. Vascular: No hyperdense vessel or unexpected calcification. Skull: Normal. Negative for fracture or focal lesion. Sinuses/Orbits: No acute finding. Other: None IMPRESSION: 1. Unchanged intraventricular hemorrhage without hydrocephalus. 2. Unchanged intraparenchymal hemorrhage component in the right caudate head. Electronically Signed   By: Deatra Criscuolo M.D.   On: 06/07/2023 00:01   CT HEAD WO CONTRAST ( )  Result Date: 06/06/2023 CLINICAL DATA:  Stroke follow-up. Severe headache beginning today with nausea. EXAM: CT HEAD WITHOUT CONTRAST TECHNIQUE: Contiguous axial images were obtained from the base of the skull through the  vertex without intravenous contrast. RADIATION DOSE REDUCTION: This exam was performed according to the departmental dose-optimization program which includes automated exposure control, adjustment of the mA and/or kV according to patient size and/or use of iterative reconstruction technique. COMPARISON:  Yesterday FINDINGS: Brain: Unchanged extent of intraventricular blood clot in the right more than left lateral ventricle and in the inferior third ventricular recesses. As before, hemorrhage could have arised from the right caudate nucleus, although precise site difficult to establish on reformats. No ventriculomegaly, infarct, mass, or shift. Few sulci visible but stable and likely from normal brain volume. Minor periventricular low-density that is stable. Vascular: No hyperdense vessel or unexpected calcification. Skull: Normal. Negative for fracture or focal lesion. Sinuses/Orbits: No acute finding. IMPRESSION: Unchanged intraventricular hemorrhage without hydrocephalus. Electronically Signed   By: Tiburcio Pea M.D.   On: 06/06/2023 09:06   CT Head Wo Contrast  Result Date: 06/05/2023 CLINICAL DATA:  Headache, vomiting EXAM: CT HEAD WITHOUT CONTRAST CT CERVICAL SPINE WITHOUT CONTRAST TECHNIQUE: Multidetector CT imaging of the head and cervical spine was performed following the standard protocol without intravenous contrast. Multiplanar CT image reconstructions of the cervical spine were also generated. RADIATION DOSE REDUCTION: This exam was performed according to the departmental dose-optimization program which includes automated exposure control, adjustment of the mA and/or kV according to patient size and/or use of iterative reconstruction technique. COMPARISON:  None Available. FINDINGS: CT HEAD FINDINGS Brain: There is acute intraventricular  hemorrhage in the lateral, third, and fourth ventricles there is no hydrocephalus. The intraventricular hemorrhage is likely extending from parenchymal hemorrhage  in the right caudate head measuring up to approximately 9 mm (4-27). There is mild surrounding edema but no significant mass effect. No subarachnoid hemorrhage is seen. There is no acute territorial infarct Parenchymal volume is normal. Gray-white differentiation is preserved The pituitary and suprasellar region are normal. There is no mass lesion. There is no midline shift. Vascular: No hyperdense vessel or unexpected calcification. Skull: Normal. Negative for fracture or focal lesion. Sinuses/Orbits: The imaged paranasal sinuses are clear. The imaged globes and orbits are unremarkable. Other: The mastoid air cells and middle ear cavities are clear. CT CERVICAL SPINE FINDINGS Alignment: There is slight reversal of the normal cervical curvature. There is no antero or retrolisthesis. Skull base and vertebrae: Skull base alignment is maintained. Vertebral body heights are preserved. There is no evidence of acute fracture. There is no suspicious osseous lesion. Soft tissues and spinal canal: No prevertebral fluid or swelling. No visible canal hematoma. Disc levels: There is mild disc space narrowing with associated degenerative endplate change C4-C5 through C6-C7. There are bulky anterior osteophytes at C4-C5 as well as a disc protrusion resulting in moderate spinal canal stenosis. Upper chest: The imaged lung apices are clear. Other: None. IMPRESSION: 1. 9 mm intraparenchymal hematoma in the right caudate head with intraventricular extension into the lateral, third, and fourth ventricles but no hydrocephalus. 2. No acute fracture or traumatic malalignment of the cervical spine. 3. Degenerative changes at C4-C5 through C6-C7 with up to moderate spinal canal stenosis at C4-C5. Critical Value/emergent results were called by telephone at the time of interpretation on 06/05/2023 at 12:59 pm to provider Dr Lenard Lance, who verbally acknowledged these results. Electronically Signed   By: Lesia Hausen M.D.   On: 06/05/2023  13:01   CT CERVICAL SPINE WO CONTRAST  Result Date: 06/05/2023 CLINICAL DATA:  Headache, vomiting EXAM: CT HEAD WITHOUT CONTRAST CT CERVICAL SPINE WITHOUT CONTRAST TECHNIQUE: Multidetector CT imaging of the head and cervical spine was performed following the standard protocol without intravenous contrast. Multiplanar CT image reconstructions of the cervical spine were also generated. RADIATION DOSE REDUCTION: This exam was performed according to the departmental dose-optimization program which includes automated exposure control, adjustment of the mA and/or kV according to patient size and/or use of iterative reconstruction technique. COMPARISON:  None Available. FINDINGS: CT HEAD FINDINGS Brain: There is acute intraventricular hemorrhage in the lateral, third, and fourth ventricles there is no hydrocephalus. The intraventricular hemorrhage is likely extending from parenchymal hemorrhage in the right caudate head measuring up to approximately 9 mm (4-27). There is mild surrounding edema but no significant mass effect. No subarachnoid hemorrhage is seen. There is no acute territorial infarct Parenchymal volume is normal. Gray-white differentiation is preserved The pituitary and suprasellar region are normal. There is no mass lesion. There is no midline shift. Vascular: No hyperdense vessel or unexpected calcification. Skull: Normal. Negative for fracture or focal lesion. Sinuses/Orbits: The imaged paranasal sinuses are clear. The imaged globes and orbits are unremarkable. Other: The mastoid air cells and middle ear cavities are clear. CT CERVICAL SPINE FINDINGS Alignment: There is slight reversal of the normal cervical curvature. There is no antero or retrolisthesis. Skull base and vertebrae: Skull base alignment is maintained. Vertebral body heights are preserved. There is no evidence of acute fracture. There is no suspicious osseous lesion. Soft tissues and spinal canal: No prevertebral fluid or swelling. No  visible  canal hematoma. Disc levels: There is mild disc space narrowing with associated degenerative endplate change C4-C5 through C6-C7. There are bulky anterior osteophytes at C4-C5 as well as a disc protrusion resulting in moderate spinal canal stenosis. Upper chest: The imaged lung apices are clear. Other: None. IMPRESSION: 1. 9 mm intraparenchymal hematoma in the right caudate head with intraventricular extension into the lateral, third, and fourth ventricles but no hydrocephalus. 2. No acute fracture or traumatic malalignment of the cervical spine. 3. Degenerative changes at C4-C5 through C6-C7 with up to moderate spinal canal stenosis at C4-C5. Critical Value/emergent results were called by telephone at the time of interpretation on 06/05/2023 at 12:59 pm to provider Dr Lenard Lance, who verbally acknowledged these results. Electronically Signed   By: Lesia Hausen M.D.   On: 06/05/2023 13:01    Labs: Basic Metabolic Panel: Recent Labs  Lab 06/14/23 1645 06/16/23 0433 06/17/23 0456  NA 132* 136 136  K 4.2 3.2* 3.3*  CL 94* 96* 99  CO2 24 27 28   GLUCOSE 352* 340* 250*  BUN 28* 25* 20  CREATININE 0.79 0.77 0.58  CALCIUM 9.3 9.3 9.0   CBC: Recent Labs  Lab 06/14/23 1645 06/16/23 0433 06/17/23 0456  WBC 11.8* 23.6* 14.1*  HGB 10.8* 10.3* 9.9*  HCT 33.8* 31.1* 29.0*  MCV 92.9 90.9 88.4  PLT 295 251 253   Microbiology: Results for orders placed or performed during the hospital encounter of 06/14/23  Urine Culture (for pregnant, neutropenic or urologic patients or patients with an indwelling urinary catheter)     Status: Abnormal   Collection Time: 06/14/23  4:30 PM   Specimen: Urine, Random  Result Value Ref Range Status   Specimen Description   Final    URINE, RANDOM Performed at [facility], [address], [place], [place] [address]    Special Requests   Final    NONE Performed at [facility], [address] [place], [place] [address]    Culture  >=100,000 COLONIES/mL ESCHERICHIA COLI (A)  Final   Report Status 06/17/2023 FINAL  Final   Organism ID, Bacteria ESCHERICHIA COLI (A)  Final      Susceptibility   Escherichia coli - MIC*    AMPICILLIN >=32 RESISTANT Resistant     CEFAZOLIN <=4 SENSITIVE Sensitive     CEFEPIME <=0.12 SENSITIVE Sensitive     CEFTRIAXONE <=0.25 SENSITIVE Sensitive     CIPROFLOXACIN <=0.25 SENSITIVE Sensitive     GENTAMICIN <=1 SENSITIVE Sensitive     IMIPENEM <=0.25 SENSITIVE Sensitive     NITROFURANTOIN <=16 SENSITIVE Sensitive     TRIMETH/SULFA <=20 SENSITIVE Sensitive     AMPICILLIN/SULBACTAM 16 INTERMEDIATE Intermediate     PIP/TAZO <=4 SENSITIVE Sensitive ug/mL    * >=100,000 COLONIES/mL ESCHERICHIA COLI  Resp panel by RT-PCR (RSV, Flu A&B, Covid) Anterior Nasal Swab     Status: None   Collection Time: 06/14/23  8:49 PM   Specimen: Anterior Nasal Swab  Result Value Ref Range Status   SARS Coronavirus 2 by RT PCR NEGATIVE NEGATIVE Final    Comment: (NOTE) SARS-CoV-2 target nucleic acids are NOT DETECTED.  The SARS-CoV-2 RNA is generally detectable in upper respiratory specimens during the acute phase of infection. The lowest concentration of SARS-CoV-2 viral copies this assay can detect is 138 copies/mL. A negative result does not preclude SARS-Cov-2 infection and should not be used as the sole basis for treatment or other patient management decisions. A negative result may occur with  improper specimen  collection/handling, submission of specimen other than nasopharyngeal swab, presence of viral mutation(s) within the areas targeted by this assay, and inadequate number of viral copies(<138 copies/mL). A negative result must be combined with clinical observations, patient history, and epidemiological information. The expected result is Negative.  Fact Sheet for Patients:  BloggerCourse.com  Fact Sheet for Healthcare Providers:   SeriousBroker.it  This test is no t yet approved or cleared by the [place] FDA and  has been authorized for detection and/or diagnosis of SARS-CoV-2 by FDA under an Emergency Use Authorization (EUA). This EUA will remain  in effect (meaning this test can be used) for the duration of the COVID-19 declaration under Section 564(b)(1) of the Act, 21 U.S.C.section 360bbb-3(b)(1), unless the authorization is terminated  or revoked sooner.       Influenza A by PCR NEGATIVE NEGATIVE Final   Influenza B by PCR NEGATIVE NEGATIVE Final    Comment: (NOTE) The Xpert Xpress SARS-CoV-2/FLU/RSV plus assay is intended as an aid in the diagnosis of influenza from Nasopharyngeal swab specimens and should not be used as a sole basis for treatment. Nasal washings and aspirates are unacceptable for Xpert Xpress SARS-CoV-2/FLU/RSV testing.  Fact Sheet for Patients: BloggerCourse.com  Fact Sheet for Healthcare Providers: SeriousBroker.it  This test is not yet approved or cleared by the [place] FDA and has been authorized for detection and/or diagnosis of SARS-CoV-2 by FDA under an Emergency Use Authorization (EUA). This EUA will remain in effect (meaning this test can be used) for the duration of the COVID-19 declaration under Section 564(b)(1) of the Act, 21 U.S.C. section 360bbb-3(b)(1), unless the authorization is terminated or revoked.     Resp Syncytial Virus by PCR NEGATIVE NEGATIVE Final    Comment: (NOTE) Fact Sheet for Patients: BloggerCourse.com  Fact Sheet for Healthcare Providers: SeriousBroker.it  This test is not yet approved or cleared by the [place] FDA and has been authorized for detection and/or diagnosis of SARS-CoV-2 by FDA under an Emergency Use Authorization (EUA). This EUA will remain in effect (meaning this test can be used) for  the duration of the COVID-19 declaration under Section 564(b)(1) of the Act, 21 U.S.C. section 360bbb-3(b)(1), unless the authorization is terminated or revoked.  Performed at [facility], [address] [place], [place] [address]   Culture, blood (Routine X 2) w Reflex to ID Panel     Status: None (Preliminary result)   Collection Time: 06/15/23 11:33 PM   Specimen: BLOOD  Result Value Ref Range Status   Specimen Description BLOOD BLOOD LEFT ARM  Final   Special Requests   Final    BOTTLES DRAWN AEROBIC AND ANAEROBIC Blood Culture adequate volume   Culture   Final    NO GROWTH 4 DAYS Performed at [facility], [address] [place], [place] [address]    Report Status PENDING  Incomplete  Culture, blood (Routine X 2) w Reflex to ID Panel     Status: None (Preliminary result)   Collection Time: 06/15/23 11:34 PM   Specimen: BLOOD  Result Value Ref Range Status   Specimen Description BLOOD BLOOD LEFT HAND  Final   Special Requests   Final    BOTTLES DRAWN AEROBIC AND ANAEROBIC Blood Culture adequate volume   Culture   Final    NO GROWTH 4 DAYS Performed at [facility], [address] [place], [place] [address]    Report Status PENDING  Incomplete   Time coordinating discharge: Over 30 minutes  Leeroy Bock, MD  [facility] 06/19/2023, 4:53 PM

## 2023-06-20 LAB — CULTURE, BLOOD (ROUTINE X 2)
Culture: NO GROWTH
Culture: NO GROWTH
Special Requests: ADEQUATE
Special Requests: ADEQUATE

## 2023-06-23 ENCOUNTER — Encounter: Payer: Self-pay | Admitting: Internal Medicine

## 2023-06-23 DIAGNOSIS — I161 Hypertensive emergency: Secondary | ICD-10-CM

## 2023-06-23 HISTORY — DX: Hypertensive emergency: I16.1

## 2023-06-30 ENCOUNTER — Encounter (HOSPITAL_COMMUNITY): Payer: Self-pay | Admitting: Internal Medicine

## 2023-08-09 ENCOUNTER — Other Ambulatory Visit: Payer: Self-pay | Admitting: Family Medicine

## 2023-08-09 DIAGNOSIS — Z1231 Encounter for screening mammogram for malignant neoplasm of breast: Secondary | ICD-10-CM

## 2023-10-09 ENCOUNTER — Other Ambulatory Visit: Payer: Self-pay

## 2023-10-10 ENCOUNTER — Ambulatory Visit: Payer: 59 | Admitting: Physician Assistant

## 2023-10-27 ENCOUNTER — Encounter: Payer: Self-pay | Admitting: Emergency Medicine

## 2023-10-27 ENCOUNTER — Ambulatory Visit
Admission: EM | Admit: 2023-10-27 | Discharge: 2023-10-27 | Disposition: A | Discharge status: Home / Self Care | Payer: 59 | Attending: Family Medicine | Admitting: Family Medicine

## 2023-10-27 DIAGNOSIS — M5441 Lumbago with sciatica, right side: Secondary | ICD-10-CM

## 2023-10-27 DIAGNOSIS — M5416 Radiculopathy, lumbar region: Secondary | ICD-10-CM

## 2023-10-27 DIAGNOSIS — G8929 Other chronic pain: Secondary | ICD-10-CM | POA: Diagnosis not present

## 2023-10-27 MED ORDER — METHOCARBAMOL 500 MG PO TABS: 500.0000 mg | ORAL_TABLET | Freq: Two times a day (BID) | ORAL | 0 refills | Status: DC

## 2023-10-27 MED ORDER — GABAPENTIN (ONCE-DAILY) 300 MG PO TABS: 300.0000 mg | ORAL_TABLET | Freq: Every day | ORAL | 0 refills | Status: AC

## 2023-10-27 MED ORDER — KETOROLAC TROMETHAMINE 60 MG/2ML IM SOLN
30.0000 mg | Freq: Once | INTRAMUSCULAR | Status: AC
  Administered 2023-10-27: 30 mg via INTRAMUSCULAR

## 2023-10-27 MED ORDER — PREDNISONE 10 MG (21) PO TBPK: ORAL_TABLET | Freq: Every day | ORAL | 0 refills | Status: DC

## 2023-10-27 NOTE — ED Provider Notes (Signed)
 [facility]    CSN: [id number] Arrival date & time: 10/27/23  1129      History   Chief Complaint Chief Complaint  Patient presents with   Back Pain    HPI  HPI Ariana Jacobs is Jacobs 58 y.o. female.   Ariana Jacobs presents for right low back pain that radiates down her right leg for the past 3-4 weeks  Pain doesn't allow her to sleep. Pain described as sharp and achy. Been taking Tylenol and ibuprofen but these are not helping.  Nothing taken for pain today.  Denies bowel and bladder incontinence.  No saddle paresthesia.  Requests Jacobs injection for pain as this helped in the past.      Past Medical History:  Diagnosis Date   Diabetes mellitus without complication (HCC)    Hypertension    Hypertensive emergency 06/23/2023    Patient Active Problem List   Diagnosis Date Noted   Generalized weakness 06/15/2023   Nausea vomiting and diarrhea 06/15/2023   Sepsis secondary to UTI (HCC) 06/15/2023   UTI (urinary tract infection) 06/15/2023   Type 2 diabetes mellitus with hyperglycemia (HCC) 06/07/2023   Uncontrolled hypertension 06/07/2023   Hemorrhagic stroke (HCC) 06/05/2023   Acute nonintractable headache 06/04/2023   Acute right-sided low back pain with right-sided sciatica 06/04/2023    Past Surgical History:  Procedure Laterality Date   CESAREAN SECTION     FOOT SURGERY      OB History   No obstetric history on file.      Home Medications    Prior to Admission medications   Medication Sig Start Date End Date Taking? Authorizing Provider  methocarbamol (ROBAXIN) 500 MG tablet Take 1 tablet (500 mg total) by mouth 2 (two) times daily. 10/27/23  Yes Ariana Schlottman, Jacobs  predniSONE (STERAPRED UNI-PAK 21 TAB) 10 MG (21) TBPK tablet Take by mouth daily. Take 6 tabs by mouth daily for 1, then 5 tabs for 1 day, then 4 tabs for 1 day, then 3 tabs for 1 day, then 2 tabs for 1 day, then 1 tab for 1 day. 10/27/23  Yes Ariana Guyette, Jacobs  amLODipine (NORVASC) 5 MG  tablet Take 1 tablet (5 mg total) by mouth daily. 06/18/23 08/17/23  Ariana Bock, MD  atorvastatin (LIPITOR) 40 MG tablet Take 40 mg by mouth at bedtime. 08/16/21   [provider]  Blood Glucose Monitoring Suppl DEVI 1 each by Does not apply route in the morning, at noon, and at bedtime. May substitute to any manufacturer covered by patient's insurance. 06/09/23   Ariana Grandchild Jacobs, Jacobs  Gabapentin, Once-Daily, 300 MG TABS Take 1 tablet (300 mg total) by mouth at bedtime. 10/27/23 11/26/23  Ariana Jacobs  JARDIANCE 25 MG TABS tablet Take 25 mg by mouth daily. 04/21/23   [provider]  LANTUS SOLOSTAR 100 UNIT/ML Solostar Pen Inject 25 Units into the skin daily. 06/17/23 07/17/23  Ariana Bock, MD  metFORMIN (GLUCOPHAGE-XR) 500 MG 24 hr tablet Take 1,000 mg by mouth 2 (two) times daily. 04/21/23   [provider]  metoprolol tartrate (LOPRESSOR) 25 MG tablet Take 0.5 tablets (12.5 mg total) by mouth 2 (two) times daily. 06/17/23 07/17/23  Ariana Bock, MD  omeprazole (PRILOSEC) 20 MG capsule Take 20 mg by mouth daily. 06/17/23   [provider]  GLIPIZIDE PO Take by mouth daily.  09/20/19  [provider]  HYDRALAZINE-HCTZ PO Take by mouth daily.  04/07/20  [provider]  hydrochlorothiazide (HYDRODIURIL) 25 MG tablet  01/01/19 09/20/19  [provider]  LISINOPRIL PO Take by mouth daily.  09/20/19  [provider]    Family History Family History  Problem Relation Age of Onset   Healthy Mother    Diabetes Father    Breast cancer Neg Hx     Social History Social History   Tobacco Use   Smoking status: Never   Smokeless tobacco: Never  Vaping Use   Vaping status: Never Used  Substance Use Topics   Alcohol use: Yes    Comment: Occasionally   Drug use: No     Allergies   Patient has no known allergies.   Review of Systems Review of Systems: egative unless otherwise stated in HPI.       Physical Exam Triage Vital Signs ED Triage Vitals  Encounter Vitals Group     BP 10/27/23 1257 (!) 151/83     Systolic BP Percentile --      Diastolic BP Percentile --      Pulse Rate 10/27/23 1257 93     Resp 10/27/23 1257 14     Temp 10/27/23 1257 98.3 F (36.8 C)     Temp Source 10/27/23 1257 Oral     SpO2 10/27/23 1257 98 %     Weight 10/27/23 1256 112 lb 10.5 oz (51.1 kg)     Height 10/27/23 1256 4\' 11"  (1.499 m)     Head Circumference --      Peak Flow --      Pain Score 10/27/23 1256 10     Pain Loc --      Pain Education --      Exclude from Growth Chart --    No data found.  Updated Vital Signs BP (!) 151/83 (BP Location: Left Arm)   Pulse 93   Temp 98.3 F (36.8 C) (Oral)   Resp 14   Ht 4\' 11"  (1.499 m)   Wt 51.1 kg   SpO2 98%   BMI 22.75 kg/m   Visual Acuity Right Eye Distance:   Left Eye Distance:   Bilateral Distance:    Right Eye Near:   Left Eye Near:    Bilateral Near:     Physical Exam GEN: well appearing female in no acute distress  CVS: well perfused  RESP: speaking in full sentences without pause, no respiratory distress  MSK:  Lumbar spine: - Inspection: no gross deformity or asymmetry, swelling or ecchymosis. No skin changes  - Palpation: TTP over the spinous processes, right lumbar paraspinal muscle tenderness and hypertonicity, no SI joint tenderness bilaterally - ROM: Good active ROM of the lumbar spine in flexion and extension but with mild pain  - Strength: 5/5 strength of lower extremity in L4-S1 nerve root distributions b/l - Neuro: sensation intact in the L4-S1 nerve root distribution b/l - Special testing: Positive right straight leg raise SKIN: warm, dry, no overly skin rash or erythema    UC Treatments / Results  Labs (all labs ordered are listed, but only abnormal results are displayed) Labs Reviewed - No data to display  EKG   Radiology No results found.   Procedures Procedures (including critical care  time)  Medications Ordered in UC Medications  ketorolac (TORADOL) injection 30 mg (30 mg Intramuscular Given 10/27/23 1346)    Initial Impression / Assessment and Plan / UC Course  I have reviewed the triage vital signs and the nursing notes.  Pertinent labs & imaging results that  were available during my care of the patient were reviewed by me and considered in my medical decision making (see chart for details).      Pt is Jacobs 58 y.o.  female with acute on chronic low back pain for the past 4 weeks.  On chart review, she had Jacobs lumbar x-ray performed in April 2024 that showed mild disc height loss at L5-S1 at the [facility] ED.  Notes and imaging reviewed..  Imaging deferred today.  Given 30 mg IM Toradol at patient's request.   Patient to gradually return to normal activities, as tolerated and continue ordinary activities within the limits permitted by pain.  Restart gabapentin as she is no longer taking this and has radicular symptoms.  Prescribed prednisone taper and muscle relaxer  for pain relief.  Ibuprofen, Tylenol and Lidocaine patches PRN for multimodal pain relief.  Given outline on how to adjust her insulin while taking prednisone.  Counseled patient on red flag symptoms and when to seek immediate care.  No red flags suggesting cauda equina syndrome or progressive major motor weakness. Patient to follow up with orthopedic provider if symptoms Jacobs not improve with conservative treatment.  Return and ED precautions given.    Discussed MDM, treatment plan and plan for follow-up with patient who agrees with plan.   Final Clinical Impressions(s) / UC Diagnoses   Final diagnoses:  Acute lumbar radiculopathy  Chronic right-sided low back pain with right-sided sciatica     Discharge Instructions      Take ibuprofen 600 mg with Tylenol 1000 mg for pain. Take Gabapentin and muscle relaxer at bedtime. Take prednisone as prescribed. Watch your blood sugars while taking prednisone. Adjust your  insulin while taking prednisone.    If blood sugar in the morning before eating is: 300: take 28 units  350: take 30 units  400 :take 32 units      ED Prescriptions     Medication Sig Dispense Auth. Provider   Gabapentin, Once-Daily, 300 MG TABS Take 1 tablet (300 mg total) by mouth at bedtime. 30 tablet Lurlene Ronda, Jacobs   predniSONE (STERAPRED UNI-PAK 21 TAB) 10 MG (21) TBPK tablet Take by mouth daily. Take 6 tabs by mouth daily for 1, then 5 tabs for 1 day, then 4 tabs for 1 day, then 3 tabs for 1 day, then 2 tabs for 1 day, then 1 tab for 1 day. 21 tablet Vernetta Dizdarevic, Jacobs   methocarbamol (ROBAXIN) 500 MG tablet Take 1 tablet (500 mg total) by mouth 2 (two) times daily. 20 tablet Katha Cabal, Jacobs      PDMP not reviewed this encounter.   Katha Cabal, Jacobs 10/30/23 [phone]

## 2023-10-27 NOTE — Discharge Instructions (Addendum)
 Take ibuprofen 600 mg with Tylenol 1000 mg for pain. Take Gabapentin and muscle relaxer at bedtime. Take prednisone as prescribed. Watch your blood sugars while taking prednisone. Adjust your insulin while taking prednisone.    If blood sugar in the morning before eating is: 300: take 28 units  350: take 30 units  400 :take 32 units

## 2023-10-27 NOTE — ED Triage Notes (Signed)
 Patient c/o right sided lower back pain that radiates down her right leg for the past 4 weeks.

## 2024-01-11 ENCOUNTER — Other Ambulatory Visit: Payer: Self-pay

## 2024-01-11 DIAGNOSIS — Z1231 Encounter for screening mammogram for malignant neoplasm of breast: Secondary | ICD-10-CM

## 2024-01-22 ENCOUNTER — Encounter: Payer: Self-pay | Admitting: *Deleted

## 2024-01-22 ENCOUNTER — Ambulatory Visit
Admission: EM | Admit: 2024-01-22 | Discharge: 2024-01-22 | Disposition: A | Discharge status: Home / Self Care | Attending: Emergency Medicine | Admitting: Emergency Medicine

## 2024-01-22 ENCOUNTER — Ambulatory Visit: Payer: Self-pay | Admitting: Emergency Medicine

## 2024-01-22 DIAGNOSIS — K5900 Constipation, unspecified: Secondary | ICD-10-CM | POA: Insufficient documentation

## 2024-01-22 DIAGNOSIS — K625 Hemorrhage of anus and rectum: Secondary | ICD-10-CM | POA: Diagnosis present

## 2024-01-22 LAB — CBC WITH DIFFERENTIAL/PLATELET
Abs Immature Granulocytes: 0.01 10*3/uL (ref 0.00–0.07)
Basophils Absolute: 0 10*3/uL (ref 0.0–0.1)
Basophils Relative: 1 %
Eosinophils Absolute: 0 10*3/uL (ref 0.0–0.5)
Eosinophils Relative: 1 %
HCT: 35 % — ABNORMAL LOW (ref 36.0–46.0)
Hemoglobin: 11.8 g/dL — ABNORMAL LOW (ref 12.0–15.0)
Immature Granulocytes: 0 %
Lymphocytes Relative: 37 %
Lymphs Abs: 2.2 10*3/uL (ref 0.7–4.0)
MCH: 31.3 pg (ref 26.0–34.0)
MCHC: 33.7 g/dL (ref 30.0–36.0)
MCV: 92.8 fL (ref 80.0–100.0)
Monocytes Absolute: 0.5 10*3/uL (ref 0.1–1.0)
Monocytes Relative: 8 %
Neutro Abs: 3.2 10*3/uL (ref 1.7–7.7)
Neutrophils Relative %: 53 %
Platelets: 278 10*3/uL (ref 150–400)
RBC: 3.77 MIL/uL — ABNORMAL LOW (ref 3.87–5.11)
RDW: 12.8 % (ref 11.5–15.5)
WBC: 5.9 10*3/uL (ref 4.0–10.5)
nRBC: 0 % (ref 0.0–0.2)

## 2024-01-22 NOTE — ED Triage Notes (Signed)
 Patient states she had a bowel movement today and there was blood on the tissue, she is unsure what color the stool itself was but states it was dark.

## 2024-01-22 NOTE — Discharge Instructions (Signed)
 I am checking a hemoglobin to make sure that you are not acutely anemic as a baseline.  It has been going down over the past year.  You need to get a colonoscopy ASAP.  Please follow-up with your primary care provider or contact [facility] and schedule colonoscopy.  MiraLAX .  Increase your fluids to 2 L of nonsugary, noncaffeinated, nonalcoholic beverages per day.  May rinse off after stooling instead of wiping to decrease irritation.

## 2024-01-22 NOTE — ED Provider Notes (Signed)
 HPI  SUBJECTIVE:  Ariana Jacobs is a 58 y.o. female who presents with bright red blood on the toilet paper after stooling twice today.  She has been straining with stooling.  No painful rectal masses, itching, pain with stooling.  No abdominal pain, chest pain, shortness of breath, lightheadedness, dizziness, syncope.  No regular NSAID use.  No foul-smelling, black, tarry stools, blood-streaked stool or blood dripping into the toilet bowl.  She drinks 500 cc of water a day. Patient has a past medical history of diabetes, hypertension, hemorrhagic stroke.  She has never had a colonoscopy.  PCP: [facility].  Past Medical History:  Diagnosis Date   Diabetes mellitus without complication (HCC)    Hypertension    Hypertensive emergency 06/23/2023    Past Surgical History:  Procedure Laterality Date   CESAREAN SECTION     FOOT SURGERY      Family History  Problem Relation Age of Onset   Healthy Mother    Diabetes Father    Breast cancer Neg Hx     Social History   Tobacco Use   Smoking status: Never   Smokeless tobacco: Never  Vaping Use   Vaping status: Never Used  Substance Use Topics   Alcohol use: Not Currently    Comment: Occasionally   Drug use: No    No current facility-administered medications for this encounter.  Current Outpatient Medications:    amLODipine  (NORVASC ) 5 MG tablet, Take 1 tablet (5 mg total) by mouth daily., Disp: 60 tablet, Rfl: 0   atorvastatin  (LIPITOR ) 40 MG tablet, Take 40 mg by mouth at bedtime., Disp: , Rfl:    Blood Glucose Monitoring Suppl DEVI, 1 each by Does not apply route in the morning, at noon, and at bedtime. May substitute to any manufacturer covered by patient's insurance., Disp: 1 each, Rfl: 0   Gabapentin , Once-Daily, 300 MG TABS, Take 1 tablet (300 mg total) by mouth at bedtime., Disp: 30 tablet, Rfl: 0   JARDIANCE 25 MG TABS tablet, Take 25 mg by mouth daily., Disp: , Rfl:    LANTUS  SOLOSTAR 100 UNIT/ML  Solostar Pen, Inject 25 Units into the skin daily., Disp: 7.5 mL, Rfl: 0   metFORMIN (GLUCOPHAGE-XR) 500 MG 24 hr tablet, Take 1,000 mg by mouth 2 (two) times daily., Disp: , Rfl:    methocarbamol  (ROBAXIN ) 500 MG tablet, Take 1 tablet (500 mg total) by mouth 2 (two) times daily., Disp: 20 tablet, Rfl: 0   metoprolol  tartrate (LOPRESSOR ) 25 MG tablet, Take 0.5 tablets (12.5 mg total) by mouth 2 (two) times daily., Disp: 60 tablet, Rfl: 0   omeprazole (PRILOSEC) 20 MG capsule, Take 20 mg by mouth daily., Disp: , Rfl:   No Known Allergies   ROS  As noted in HPI.   Physical Exam  BP (!) 152/73 (BP Location: Left Arm) Comment: checked twice, provider notified, no missed BP med  Pulse 75   Temp 98 F (36.7 C) (Oral)   Resp 18   Ht 4\' 11"  (1.499 m)   Wt 57.2 kg   SpO2 99%   BMI 25.45 kg/m   Constitutional: Well developed, well nourished, no acute distress Eyes: PERRL, EOMI, conjunctiva normal bilaterally HENT: Normocephalic, atraumatic,mucus membranes moist Respiratory: Clear to auscultation bilaterally, no rales, no wheezing, no rhonchi Cardiovascular: Normal rate and rhythm, no murmurs, no gallops, no rubs GI: Soft, nondistended, normal bowel sounds, nontender, no rebound, no guarding Rectal: Normal external appearance.  Few nontender skin tags.  No  notable fissures.  No prolapsing internal hemorrhoids with Valsalva.  No pain with digital rectal exam.  No gross bright red or black tarry blood on glove.  Chaperone present during exa skin: No rash, skin intact Musculoskeletal:  no deformities Neurologic: Alert & oriented x 3, CN III-XII grossly intact, no motor deficits, sensation grossly intact Psychiatric: Speech and behavior appropriate   ED Course   Medications - No data to display  Orders Placed This Encounter  Procedures   CBC with Differential    Standing Status:   Standing    Number of Occurrences:   1   Results for orders placed or performed during the hospital  encounter of 01/22/24 (from the past 24 hours)  CBC with Differential     Status: Abnormal   Collection Time: 01/22/24  7:30 PM  Result Value Ref Range   WBC 5.9 4.0 - 10.5 K/uL   RBC 3.77 (L) 3.87 - 5.11 MIL/uL   Hemoglobin 11.8 (L) 12.0 - 15.0 g/dL   HCT 65.7 (L) 84.6 - 96.2 %   MCV 92.8 80.0 - 100.0 fL   MCH 31.3 26.0 - 34.0 pg   MCHC 33.7 30.0 - 36.0 g/dL   RDW 95.2 84.1 - 32.4 %   Platelets 278 150 - 400 K/uL   nRBC 0.0 0.0 - 0.2 %   Neutrophils Relative % 53 %   Neutro Abs 3.2 1.7 - 7.7 K/uL   Lymphocytes Relative 37 %   Lymphs Abs 2.2 0.7 - 4.0 K/uL   Monocytes Relative 8 %   Monocytes Absolute 0.5 0.1 - 1.0 K/uL   Eosinophils Relative 1 %   Eosinophils Absolute 0.0 0.0 - 0.5 K/uL   Basophils Relative 1 %   Basophils Absolute 0.0 0.0 - 0.1 K/uL   Immature Granulocytes 0 %   Abs Immature Granulocytes 0.01 0.00 - 0.07 K/uL   No results found.  ED Clinical Impression  1. Rectal bleeding   2. Constipation, unspecified constipation type      ED Assessment/Plan     Patient presents with rectal bleeding, I suspect it is from constipation.  She has no abdominal pain.  Will check CBC as a baseline.  Last hemoglobin we have is October 2024 at 9.9.  Her baseline seems to be around 12.  Deferring Hemoccult as it would not change management.  If she had some rectal irritation with bleeding, it would be positive.  I doubt clinically significant, upper GI or lower GI bleed.  Advised MiraLAX , increase fluids to 2 L of water a day, advised that she needs to get a colonoscopy ASAP.  Follow-up with PCP.  ER return precautions given.  Hemoglobin 11.8, up from most recent few values.  Discussed labs,  MDM, treatment plan, and plan for follow-up with patient Discussed sn/sx that should prompt return to the ED. patient agrees with plan.   No orders of the defined types were placed in this encounter.     *This clinic note was created using Dragon dictation software. Therefore, there  may be occasional mistakes despite careful proofreading. ?    Ethlyn Herd, MD 01/22/24 2027

## 2024-05-06 ENCOUNTER — Other Ambulatory Visit: Payer: Self-pay

## 2024-05-06 ENCOUNTER — Ambulatory Visit
Admission: EM | Admit: 2024-05-06 | Discharge: 2024-05-06 | Disposition: A | Discharge status: Home / Self Care | Attending: Family Medicine | Admitting: Family Medicine

## 2024-05-06 DIAGNOSIS — M5417 Radiculopathy, lumbosacral region: Secondary | ICD-10-CM

## 2024-05-06 MED ORDER — PREDNISONE 10 MG (21) PO TBPK: ORAL_TABLET | Freq: Every day | ORAL | 0 refills | Status: AC

## 2024-05-06 MED ORDER — METHOCARBAMOL 500 MG PO TABS: 500.0000 mg | ORAL_TABLET | Freq: Two times a day (BID) | ORAL | 0 refills | Status: AC

## 2024-05-06 MED ORDER — KETOROLAC TROMETHAMINE 60 MG/2ML IM SOLN
30.0000 mg | Freq: Once | INTRAMUSCULAR | Status: AC
  Administered 2024-05-06: 30 mg via INTRAMUSCULAR

## 2024-05-06 NOTE — Discharge Instructions (Addendum)
 Take Tylenol  1000 mg every 6 hours as needed for pain. Use the muscle relaxer at bedtime. Take Gabapentin  and muscle relaxer at bedtime. Take prednisone  as prescribed. Watch your blood sugars while taking prednisone . Adjust your insulin  while taking prednisone .     If blood sugar in the morning before eating is: 300: take 30 units  350: take 32 units  400 :take 34 units

## 2024-05-06 NOTE — ED Provider Notes (Signed)
 [facility]    CSN: [id number] Arrival date & time: 05/06/24  0853      History   Chief Complaint Chief Complaint  Patient presents with   Spasms    HPI Ariana Jacobs is a 58 y.o. female.   HPI  History obtained from the patient. Ariana Jacobs presents for 1 day of left lower back pain that radiates down her left leg after waking up.  She is not able to sleep. Tosses and turns at night.  Had similar pain before and was given a shot for it. Pain described as aching and stiff.  Took Tylenol  and used a warm water bottle on the area.  Pain gets worse with movement and changes in position.  No falls, trauma or injury.  Denies recent heavy lifting.     Past Medical History:  Diagnosis Date   Diabetes mellitus without complication (HCC)    Hypertension    Hypertensive emergency 06/23/2023    Patient Active Problem List   Diagnosis Date Noted   Generalized weakness 06/15/2023   Nausea vomiting and diarrhea 06/15/2023   Sepsis secondary to UTI (HCC) 06/15/2023   UTI (urinary tract infection) 06/15/2023   Type 2 diabetes mellitus with hyperglycemia (HCC) 06/07/2023   Uncontrolled hypertension 06/07/2023   Hemorrhagic stroke (HCC) 06/05/2023   Acute nonintractable headache 06/04/2023   Acute right-sided low back pain with right-sided sciatica 06/04/2023    Past Surgical History:  Procedure Laterality Date   CESAREAN SECTION     FOOT SURGERY      OB History   No obstetric history on file.      Home Medications    Prior to Admission medications   Medication Sig Start Date End Date Taking? Authorizing Provider  predniSONE  (STERAPRED UNI-PAK 21 TAB) 10 MG (21) TBPK tablet Take by mouth daily. Take 6 tabs by mouth daily for 1, then 5 tabs for 1 day, then 4 tabs for 1 day, then 3 tabs for 1 day, then 2 tabs for 1 day, then 1 tab for 1 day. 05/06/24  Yes Mabrey Howland, DO  amLODipine  (NORVASC ) 5 MG tablet Take 1 tablet (5 mg total) by mouth daily. 06/18/23 08/17/23   Lenon Marien CROME, MD  atorvastatin  (LIPITOR ) 40 MG tablet Take 40 mg by mouth at bedtime. 08/16/21   [provider]  Blood Glucose Monitoring Suppl DEVI 1 each by Does not apply route in the morning, at noon, and at bedtime. May substitute to any manufacturer covered by patient's insurance. 06/09/23   Fausto Sor A, DO  Gabapentin , Once-Daily, 300 MG TABS Take 1 tablet (300 mg total) by mouth at bedtime. 10/27/23 11/26/23  Sarah Zerby, DO  JARDIANCE 25 MG TABS tablet Take 25 mg by mouth daily. 04/21/23   [provider]  LANTUS  SOLOSTAR 100 UNIT/ML Solostar Pen Inject 25 Units into the skin daily. 06/17/23 07/17/23  Lenon Marien CROME, MD  metFORMIN (GLUCOPHAGE-XR) 500 MG 24 hr tablet Take 1,000 mg by mouth 2 (two) times daily. 04/21/23   [provider]  methocarbamol  (ROBAXIN ) 500 MG tablet Take 1 tablet (500 mg total) by mouth 2 (two) times daily. 05/06/24   Taariq Leitz, DO  metoprolol  tartrate (LOPRESSOR ) 25 MG tablet Take 0.5 tablets (12.5 mg total) by mouth 2 (two) times daily. 06/17/23 07/17/23  Lenon Marien CROME, MD  omeprazole (PRILOSEC) 20 MG capsule Take 20 mg by mouth daily. 06/17/23   [provider]  GLIPIZIDE PO Take by mouth daily.  09/20/19  [provider]  HYDRALAZINE -HCTZ PO Take by mouth daily.  04/07/20  [provider]  hydrochlorothiazide  (HYDRODIURIL ) 25 MG tablet  01/01/19 09/20/19  [provider]  LISINOPRIL  PO Take by mouth daily.  09/20/19  [provider]    Family History Family History  Problem Relation Age of Onset   Healthy Mother    Diabetes Father    Breast cancer Neg Hx     Social History Social History   Tobacco Use   Smoking status: Never   Smokeless tobacco: Never  Vaping Use   Vaping status: Never Used  Substance Use Topics   Alcohol use: Not Currently    Comment: Occasionally   Drug use: No     Allergies   Patient has no known allergies.   Review of  Systems Review of Systems: negative unless otherwise stated in HPI.      Physical Exam Triage Vital Signs ED Triage Vitals  Encounter Vitals Group     BP 05/06/24 0937 (!) 151/90     [name] Systolic BP Percentile --      [name] Diastolic BP Percentile --      Boys Systolic BP Percentile --      Boys Diastolic BP Percentile --      Pulse Rate 05/06/24 0937 87     Resp 05/06/24 0937 17     Temp 05/06/24 0937 98.6 F (37 C)     Temp Source 05/06/24 0937 Oral     SpO2 05/06/24 0937 97 %     Weight --      Height --      Head Circumference --      Peak Flow --      Pain Score 05/06/24 0935 10     Pain Loc --      Pain Education --      Exclude from Growth Chart --    No data found.  Updated Vital Signs BP (!) 151/90 (BP Location: Left Arm) Comment: notified provider  Pulse 87   Temp 98.6 F (37 C) (Oral)   Resp 17   SpO2 97%   Visual Acuity Right Eye Distance:   Left Eye Distance:   Bilateral Distance:    Right Eye Near:   Left Eye Near:    Bilateral Near:     Physical Exam GEN:  uncomfortable appearing female in no acute distress  CVS: well perfused  RESP: speaking in full sentences without pause, no respiratory distress  MSK:  Spine: - Inspection: no gross deformity or asymmetry, swelling or ecchymosis. - Palpation: TTP over the lower lumbar and sacral spinous processes, left lumbar paraspinal muscle tenderness with hypertonicity, no SI joint tenderness bilaterally - ROM: Limited active ROM of the lumbar spine in flexion and extension due to pain   - Strength: 5/5 strength of lower extremity in L4-S1 nerve root distributions b/l - Neuro: sensation intact in the L4-S1 nerve root distribution b/l - Special testing: Positive left straight leg raise SKIN: warm, dry, no overly skin rash or erythema     UC Treatments / Results  Labs (all labs ordered are listed, but only abnormal results are displayed) Labs Reviewed - No data to  display  EKG   Radiology No results found.   Procedures Procedures (including critical care time)  Medications Ordered in UC Medications  ketorolac  (TORADOL ) injection 30 mg (30 mg Intramuscular Given 05/06/24 1009)    Initial Impression / Assessment and Plan / UC Course  I have  reviewed the triage vital signs and the nursing notes.  Pertinent labs & imaging results that were available during my care of the patient were reviewed by me and considered in my medical decision making (see chart for details).       Pt is a 58 y.o.  female with acute on chronic low back pain for the past 4 weeks.  On chart review, she had a lumbar x-ray performed in April 2024 that showed mild disc height loss at L5-S1 at the [facility] ED.  Notes and imaging reviewed..  Imaging deferred today.  Given 30 mg IM Toradol  at patient's request.   Patient to gradually return to normal activities, as tolerated and continue ordinary activities within the limits permitted by pain.  Continue gabapentin .  Prescribed prednisone  taper and muscle relaxer  for pain relief.  Ibuprofen , Tylenol  and Lidocaine patches PRN for multimodal pain relief.  Given outline on how to adjust her insulin  while taking prednisone .  Counseled patient on red flag symptoms and when to seek immediate care.  No red flags suggesting cauda equina syndrome or progressive major motor weakness. Patient to follow up with orthopedic provider if symptoms do not improve with conservative treatment.  Return and ED precautions given. Work note declined.      Discussed MDM, treatment plan and plan for follow-up with patient who agrees with plan.   Final Clinical Impressions(s) / UC Diagnoses   Final diagnoses:  Lumbosacral radiculopathy     Discharge Instructions      Take Tylenol  1000 mg every 6 hours as needed for pain. Use the muscle relaxer at bedtime. Take Gabapentin  and muscle relaxer at bedtime. Take prednisone  as prescribed. Watch your blood  sugars while taking prednisone . Adjust your insulin  while taking prednisone .     If blood sugar in the morning before eating is: 300: take 30 units  350: take 32 units  400 :take 34 units      ED Prescriptions     Medication Sig Dispense Auth. Provider   predniSONE  (STERAPRED UNI-PAK 21 TAB) 10 MG (21) TBPK tablet Take by mouth daily. Take 6 tabs by mouth daily for 1, then 5 tabs for 1 day, then 4 tabs for 1 day, then 3 tabs for 1 day, then 2 tabs for 1 day, then 1 tab for 1 day. 21 tablet My Rinke, DO   methocarbamol  (ROBAXIN ) 500 MG tablet Take 1 tablet (500 mg total) by mouth 2 (two) times daily. 20 tablet Melyssa Signor, DO      PDMP not reviewed this encounter.   Wadie Mattie, DO 05/06/24 1013

## 2024-05-06 NOTE — ED Triage Notes (Signed)
 Pt is here with left side from pelvis down to ankle with radiating pain this started yesterday. Pt has taken OTC meds to relieve discomfort. Pt states she has previously come for a visit and was given a shot.

## 2024-05-07 ENCOUNTER — Emergency Department
Admission: EM | Admit: 2024-05-07 | Discharge: 2024-05-07 | Disposition: A | Discharge status: Home / Self Care | Attending: Emergency Medicine | Admitting: Emergency Medicine

## 2024-05-07 ENCOUNTER — Encounter: Payer: Self-pay | Admitting: Emergency Medicine

## 2024-05-07 DIAGNOSIS — Z8673 Personal history of transient ischemic attack (TIA), and cerebral infarction without residual deficits: Secondary | ICD-10-CM | POA: Diagnosis not present

## 2024-05-07 DIAGNOSIS — R81 Glycosuria: Secondary | ICD-10-CM

## 2024-05-07 DIAGNOSIS — I10 Essential (primary) hypertension: Secondary | ICD-10-CM | POA: Diagnosis not present

## 2024-05-07 DIAGNOSIS — R350 Frequency of micturition: Secondary | ICD-10-CM | POA: Diagnosis present

## 2024-05-07 DIAGNOSIS — R739 Hyperglycemia, unspecified: Secondary | ICD-10-CM

## 2024-05-07 DIAGNOSIS — E1165 Type 2 diabetes mellitus with hyperglycemia: Secondary | ICD-10-CM | POA: Insufficient documentation

## 2024-05-07 LAB — BASIC METABOLIC PANEL WITH GFR
Anion gap: 11 (ref 5–15)
BUN: 25 mg/dL — ABNORMAL HIGH (ref 6–20)
CO2: 30 mmol/L (ref 22–32)
Calcium: 9.9 mg/dL (ref 8.9–10.3)
Chloride: 96 mmol/L — ABNORMAL LOW (ref 98–111)
Creatinine, Ser: 0.87 mg/dL (ref 0.44–1.00)
GFR, Estimated: >60 mL/min (ref >60)
Glucose, Bld: 470 mg/dL — ABNORMAL HIGH (ref 70–99)
Potassium: 4.1 mmol/L (ref 3.5–5.1)
Sodium: 137 mmol/L (ref 135–145)

## 2024-05-07 LAB — CBC WITH DIFFERENTIAL/PLATELET
Abs Immature Granulocytes: 0.03 K/uL (ref 0.00–0.07)
Basophils Absolute: 0 K/uL (ref 0.0–0.1)
Basophils Relative: 0 %
Eosinophils Absolute: 0 K/uL (ref 0.0–0.5)
Eosinophils Relative: 0 %
HCT: 34.2 % — ABNORMAL LOW (ref 36.0–46.0)
Hemoglobin: 11.4 g/dL — ABNORMAL LOW (ref 12.0–15.0)
Immature Granulocytes: 0 %
Lymphocytes Relative: 18 %
Lymphs Abs: 1.4 K/uL (ref 0.7–4.0)
MCH: 30.2 pg (ref 26.0–34.0)
MCHC: 33.3 g/dL (ref 30.0–36.0)
MCV: 90.5 fL (ref 80.0–100.0)
Monocytes Absolute: 0.8 K/uL (ref 0.1–1.0)
Monocytes Relative: 9 %
Neutro Abs: 5.8 K/uL (ref 1.7–7.7)
Neutrophils Relative %: 73 %
Platelets: 359 K/uL (ref 150–400)
RBC: 3.78 MIL/uL — ABNORMAL LOW (ref 3.87–5.11)
RDW: 12.7 % (ref 11.5–15.5)
WBC: 8 K/uL (ref 4.0–10.5)
nRBC: 0 % (ref 0.0–0.2)

## 2024-05-07 LAB — URINALYSIS, ROUTINE W REFLEX MICROSCOPIC
Bacteria, UA: NONE SEEN
Bilirubin Urine: NEGATIVE
Glucose, UA: >=500 mg/dL — AB
Hgb urine dipstick: NEGATIVE
Ketones, ur: NEGATIVE mg/dL
Leukocytes,Ua: NEGATIVE
Nitrite: NEGATIVE
Protein, ur: NEGATIVE mg/dL
Specific Gravity, Urine: 1.033 — ABNORMAL HIGH (ref 1.005–1.030)
pH: 5 (ref 5.0–8.0)

## 2024-05-07 NOTE — ED Provider Notes (Signed)
 [facility] Provider Note    Event Date/Time   First MD Initiated Contact with Patient 05/07/24 [phone]     (approximate)   History   Dysuria   HPI  Ariana Jacobs is a 58 y.o. female with a past medical history of type 2 diabetes, hypertension, hemorrhagic stroke who presents today for evaluation of urinary frequency.  Patient reports that she went to urgent care for back pain and was prescribed a Medrol  Dosepak.  She reports that ever since then she has been having urinary frequency.  She denies burning with urination.  She reports that her back pain has improved significantly and she denies having any back pain now.  She denies blood in her urine.  She reports that she has mild suprapubic discomfort.  No fevers or chills.  No nausea or vomiting.  Patient Active Problem List   Diagnosis Date Noted   Generalized weakness 06/15/2023   Nausea vomiting and diarrhea 06/15/2023   Sepsis secondary to UTI (HCC) 06/15/2023   UTI (urinary tract infection) 06/15/2023   Type 2 diabetes mellitus with hyperglycemia (HCC) 06/07/2023   Uncontrolled hypertension 06/07/2023   Hemorrhagic stroke (HCC) 06/05/2023   Acute nonintractable headache 06/04/2023   Acute right-sided low back pain with right-sided sciatica 06/04/2023          Physical Exam   Triage Vital Signs: ED Triage Vitals  Encounter Vitals Group     BP 05/07/24 0906 (!) 143/78     [name] Systolic BP Percentile --      [name] Diastolic BP Percentile --      Boys Systolic BP Percentile --      Boys Diastolic BP Percentile --      Pulse Rate 05/07/24 0906 86     Resp 05/07/24 0906 16     Temp 05/07/24 0905 97.7 F (36.5 C)     Temp Source 05/07/24 0905 Oral     SpO2 05/07/24 0906 98 %     Weight 05/07/24 0905 130 lb (59 kg)     Height 05/07/24 0905 4' 11 (1.499 m)     Head Circumference --      Peak Flow --      Pain Score 05/07/24 0905 0     Pain Loc --      Pain Education --      Exclude  from Growth Chart --     Most recent vital signs: Vitals:   05/07/24 0905 05/07/24 0906  BP:  (!) 143/78  Pulse:  86  Resp:  16  Temp: 97.7 F (36.5 C)   SpO2:  98%    Physical Exam Vitals and nursing note reviewed.  Constitutional:      General: Awake and alert. No acute distress.    Appearance: Normal appearance. The patient is normal weight.  HENT:     Head: Normocephalic and atraumatic.     Mouth: Mucous membranes are moist.  Eyes:     General: PERRL. Normal EOMs        Right eye: No discharge.        Left eye: No discharge.     Conjunctiva/sclera: Conjunctivae normal.  Cardiovascular:     Rate and Rhythm: Normal rate and regular rhythm.     Pulses: Normal pulses.  Pulmonary:     Effort: Pulmonary effort is normal. No respiratory distress.     Breath sounds: Normal breath sounds.  Abdominal:     Abdomen is soft. There is no  abdominal tenderness. No rebound or guarding. No distention. No CVAT Musculoskeletal:        General: No swelling. Normal range of motion.     Cervical back: Normal range of motion and neck supple.  Skin:    General: Skin is warm and dry.     Capillary Refill: Capillary refill takes less than 2 seconds.     Findings: No rash.  Neurological:     Mental Status: The patient is awake and alert.      ED Results / Procedures / Treatments   Labs (all labs ordered are listed, but only abnormal results are displayed) Labs Reviewed  URINALYSIS, ROUTINE W REFLEX MICROSCOPIC - Abnormal; Notable for the following components:      Result Value   Color, Urine STRAW (*)    APPearance CLEAR (*)    Specific Gravity, Urine 1.033 (*)    Glucose, UA >=500 (*)    All other components within normal limits  BASIC METABOLIC PANEL WITH GFR - Abnormal; Notable for the following components:   Chloride 96 (*)    Glucose, Bld 470 (*)    BUN 25 (*)    All other components within normal limits  CBC WITH DIFFERENTIAL/PLATELET - Abnormal; Notable for the  following components:   RBC 3.78 (*)    Hemoglobin 11.4 (*)    HCT 34.2 (*)    All other components within normal limits     EKG     RADIOLOGY     PROCEDURES:  Critical Care performed:   Procedures   MEDICATIONS ORDERED IN ED: Medications - No data to display   IMPRESSION / MDM / ASSESSMENT AND PLAN / ED COURSE  I reviewed the triage vital signs and the nursing notes.   Differential diagnosis includes, but is not limited to, urinary tract infection, nephrolithiasis, glucosuria.  Patient is awake and alert, hemodynamically stable and afebrile.  She is nontoxic in appearance.  She has no abdominal tenderness or CVA tenderness on exam.  Urinalysis reveals significant glucose urea.  Labs obtained reveal a blood sugar of 470.  Normal anion gap, normal bicarb, not consistent with DKA.  I suspect glucose urea and hyperglycemia in the context of the steroids that she was placed on yesterday.  This is most likely the etiology of her urinary discomfort.  I do not suspect kidney stone given her lack of belly pain or flank pain.  Not consistent with pyelonephritis given her normal urine and lack of constitutional symptoms.  Offered to give her insulin  and fluids while she was here but she prefers to take her insulin  at home and drink water at home.  I advised that she stop taking the steroids that were prescribed to her yesterday.  Patient reports that her back pain has improved significantly and she is in agreement with this.  We discussed return precautions and the importance of close outpatient follow-up with her PCP to recheck her blood sugar.  Patient reports that she will call today.  She understands return precautions in the meantime.  She was discharged in stable condition.   Patient's presentation is most consistent with acute complicated illness / injury requiring diagnostic workup.   Clinical Course as of 05/07/24 1106  Tue May 07, 2024  1008 Declined IV fluids or insulin   here, prefers to take her insulin  at home [JP]    Clinical Course User Index [JP] Sherilyn Windhorst, Andriette BRAVO, PA-C     FINAL CLINICAL IMPRESSION(S) / ED DIAGNOSES   Final  diagnoses:  Hyperglycemia  Glucosuria     Rx / DC Orders   ED Discharge Orders     None        Note:  This document was prepared using Dragon voice recognition software and may include unintentional dictation errors.   Navia Lindahl E, PA-C 05/07/24 1107    Jacolyn Pae, MD 05/07/24 1341

## 2024-05-07 NOTE — ED Notes (Signed)
 See triage note  Presents with some urinary freq and pain  States this started couple of days ago  Afebrile on arrival

## 2024-05-07 NOTE — ED Triage Notes (Signed)
 Patient to ED via POV for dysuria with urinary frequency. Started last PM. Denies burning with urination.

## 2024-05-07 NOTE — Discharge Instructions (Signed)
 Please take your insulin  as prescribed given that your blood sugar is very high, likely from the steroids that were prescribed to you yesterday.  You can stop taking the steroids.  Please follow-up with your outpatient provider.  Is a pleasure caring for you today.

## 2024-08-21 ENCOUNTER — Ambulatory Visit
Admission: EM | Admit: 2024-08-21 | Discharge: 2024-08-21 | Disposition: A | Discharge status: Home / Self Care | Source: Home / Self Care

## 2024-08-21 DIAGNOSIS — J069 Acute upper respiratory infection, unspecified: Secondary | ICD-10-CM

## 2024-08-21 DIAGNOSIS — J0100 Acute maxillary sinusitis, unspecified: Secondary | ICD-10-CM | POA: Diagnosis not present

## 2024-08-21 MED ORDER — BENZONATATE 100 MG PO CAPS: 200.0000 mg | ORAL_CAPSULE | Freq: Three times a day (TID) | ORAL | 0 refills | Status: AC

## 2024-08-21 MED ORDER — PROMETHAZINE-DM 6.25-15 MG/5ML PO SYRP: 5.0000 mL | ORAL_SOLUTION | Freq: Four times a day (QID) | ORAL | 0 refills | Status: AC | PRN

## 2024-08-21 MED ORDER — AMOXICILLIN-POT CLAVULANATE 875-125 MG PO TABS: 1.0000 | ORAL_TABLET | Freq: Two times a day (BID) | ORAL | 0 refills | Status: AC

## 2024-08-21 MED ORDER — IPRATROPIUM BROMIDE 0.06 % NA SOLN: 2.0000 | Freq: Four times a day (QID) | NASAL | 12 refills | Status: AC

## 2024-08-21 NOTE — ED Triage Notes (Signed)
 Sx x 2-3 weeks  Cough Sinus pressure Bilateral ear pain Sore throat  Bodyaches

## 2024-08-21 NOTE — ED Provider Notes (Signed)
 [facility]    CSN: [id number] Arrival date & time: 08/21/24  1044      History   Chief Complaint Chief Complaint  Patient presents with   Cough    HPI Ariana Jacobs is a 58 y.o. female.   HPI  58 year old female with past medical history significant for diabetes and hypertension presents for evaluation of 2 to 3 weeks worth of respiratory symptoms that include body aches, sinus pressure with thick yellow nasal discharge, bilateral ear pressure, sore throat, and cough productive for yellow sputum.  No shortness of breath or wheezing.  Past Medical History:  Diagnosis Date   Diabetes mellitus without complication (HCC)    Hypertension    Hypertensive emergency 06/23/2023    Patient Active Problem List   Diagnosis Date Noted   Generalized weakness 06/15/2023   Nausea vomiting and diarrhea 06/15/2023   Sepsis secondary to UTI (HCC) 06/15/2023   UTI (urinary tract infection) 06/15/2023   Type 2 diabetes mellitus with hyperglycemia (HCC) 06/07/2023   Uncontrolled hypertension 06/07/2023   Hemorrhagic stroke (HCC) 06/05/2023   Acute nonintractable headache 06/04/2023   Acute right-sided low back pain with right-sided sciatica 06/04/2023    Past Surgical History:  Procedure Laterality Date   CESAREAN SECTION     FOOT SURGERY      OB History   No obstetric history on file.      Home Medications    Prior to Admission medications  Medication Sig Start Date End Date Taking? Authorizing Provider  amLODipine  (NORVASC ) 10 MG tablet Take 10 mg by mouth daily.   Yes [provider]  amoxicillin -clavulanate (AUGMENTIN ) 875-125 MG tablet Take 1 tablet by mouth every 12 (twelve) hours for 10 days. 08/21/24 08/31/24 Yes Bernardino Ditch, NP  atorvastatin  (LIPITOR ) 40 MG tablet Take 40 mg by mouth at bedtime. 08/16/21  Yes [provider]  benzonatate  (TESSALON ) 100 MG capsule Take 2 capsules (200 mg total) by mouth every 8 (eight) hours.  08/21/24  Yes Bernardino Ditch, NP  ipratropium (ATROVENT ) 0.06 % nasal spray Place 2 sprays into both nostrils 4 (four) times daily. 08/21/24  Yes Bernardino Ditch, NP  JARDIANCE 25 MG TABS tablet Take 25 mg by mouth daily. 04/21/23  Yes [provider]  lisinopril -hydrochlorothiazide  (ZESTORETIC ) 20-25 MG tablet Take 1 tablet by mouth daily.   Yes [provider]  metFORMIN (GLUCOPHAGE-XR) 500 MG 24 hr tablet Take 1,000 mg by mouth 2 (two) times daily. 04/21/23  Yes [provider]  metoprolol  tartrate (LOPRESSOR ) 25 MG tablet Take 0.5 tablets (12.5 mg total) by mouth 2 (two) times daily. 06/17/23 08/21/24 Yes Lenon Marien CROME, MD  promethazine -dextromethorphan (PROMETHAZINE -DM) 6.25-15 MG/5ML syrup Take 5 mLs by mouth 4 (four) times daily as needed. 08/21/24  Yes Bernardino Ditch, NP  amLODipine  (NORVASC ) 5 MG tablet Take 1 tablet (5 mg total) by mouth daily. 06/18/23 08/17/23  Lenon Marien CROME, MD  Blood Glucose Monitoring Suppl DEVI 1 each by Does not apply route in the morning, at noon, and at bedtime. May substitute to any manufacturer covered by patient's insurance. 06/09/23   Fausto Sor A, DO  Gabapentin , Once-Daily, 300 MG TABS Take 1 tablet (300 mg total) by mouth at bedtime. 10/27/23 11/26/23  Brimage, Vondra, DO  LANTUS  SOLOSTAR 100 UNIT/ML Solostar Pen Inject 25 Units into the skin daily. 06/17/23 07/17/23  Lenon Marien CROME, MD  methocarbamol  (ROBAXIN ) 500 MG tablet Take 1 tablet (500 mg total) by mouth 2 (two) times daily. 05/06/24  Brimage, Vondra, DO  omeprazole (PRILOSEC) 20 MG capsule Take 20 mg by mouth daily. 06/17/23   [provider]  predniSONE  (STERAPRED UNI-PAK 21 TAB) 10 MG (21) TBPK tablet Take by mouth daily. Take 6 tabs by mouth daily for 1, then 5 tabs for 1 day, then 4 tabs for 1 day, then 3 tabs for 1 day, then 2 tabs for 1 day, then 1 tab for 1 day. 05/06/24   Brimage, Vondra, DO  GLIPIZIDE PO Take by mouth daily.  09/20/19  [provider]  HYDRALAZINE -HCTZ PO Take by mouth daily.  04/07/20  [provider]  hydrochlorothiazide  (HYDRODIURIL ) 25 MG tablet  01/01/19 09/20/19  [provider]  LISINOPRIL  PO Take by mouth daily.  09/20/19  [provider]    Family History Family History  Problem Relation Age of Onset   Healthy Mother    Diabetes Father    Breast cancer Neg Hx     Social History Social History[1]   Allergies   Patient has no known allergies.   Review of Systems Review of Systems  Constitutional:  Negative for fever.  HENT:  Positive for congestion, ear pain, rhinorrhea, sinus pressure and sinus pain.   Respiratory:  Positive for cough. Negative for shortness of breath and wheezing.   Musculoskeletal:  Positive for arthralgias and myalgias.     Physical Exam Triage Vital Signs ED Triage Vitals  Encounter Vitals Group     BP      [name] Systolic BP Percentile      [name] Diastolic BP Percentile      Boys Systolic BP Percentile      Boys Diastolic BP Percentile      Pulse      Resp      Temp      Temp src      SpO2      Weight      Height      Head Circumference      Peak Flow      Pain Score      Pain Loc      Pain Education      Exclude from Growth Chart    No data found.  Updated Vital Signs BP (!) 146/81 (BP Location: Right Arm)   Pulse 98   Temp 98.4 F (36.9 C) (Oral)   Resp 16   Wt 126 lb (57.2 kg)   SpO2 97%   BMI 25.45 kg/m   Visual Acuity Right Eye Distance:   Left Eye Distance:   Bilateral Distance:    Right Eye Near:   Left Eye Near:    Bilateral Near:     Physical Exam Vitals and nursing note reviewed.  Constitutional:      Appearance: Normal appearance. She is not ill-appearing.  HENT:     Head: Normocephalic and atraumatic.     Right Ear: Tympanic membrane, ear canal and external ear normal. There is no impacted cerumen.     Left Ear: Tympanic membrane, ear canal and external ear normal. There is no impacted  cerumen.     Ears:     Comments: Both external auditory canals have significant amounts of cerumen but I am able to visualize the central portion of both tympanic membranes which are pearly gray in appearance.    Nose: Congestion and rhinorrhea present.     Comments: Nasal mucosa is erythematous and edematous with milky discharge in both nares.  Bilateral maxillary sinuses are tender to  compression.    Mouth/Throat:     Mouth: Mucous membranes are moist.     Pharynx: Oropharynx is clear. No oropharyngeal exudate or posterior oropharyngeal erythema.  Cardiovascular:     Rate and Rhythm: Normal rate and regular rhythm.     Pulses: Normal pulses.     Heart sounds: Normal heart sounds. No murmur heard.    No friction rub. No gallop.  Pulmonary:     Effort: Pulmonary effort is normal.     Breath sounds: Normal breath sounds. No wheezing, rhonchi or rales.  Musculoskeletal:     Cervical back: Normal range of motion and neck supple. No tenderness.  Lymphadenopathy:     Cervical: No cervical adenopathy.  Skin:    General: Skin is warm and dry.     Capillary Refill: Capillary refill takes less than 2 seconds.     Findings: No rash.  Neurological:     General: No focal deficit present.     Mental Status: She is alert and oriented to person, place, and time.      UC Treatments / Results  Labs (all labs ordered are listed, but only abnormal results are displayed) Labs Reviewed - No data to display  EKG   Radiology No results found.  Procedures Procedures (including critical care time)  Medications Ordered in UC Medications - No data to display  Initial Impression / Assessment and Plan / UC Course  I have reviewed the triage vital signs and the nursing notes.  Pertinent labs & imaging results that were available during my care of the patient were reviewed by me and considered in my medical decision making (see chart for details).   Patient is a nontoxic-appearing 58 year old  female presenting for evaluation of 2 to 3 weeks with respiratory symptoms as outlined in HPI above.  Patient has maxillary sinus tenderness and no acute discharge in both nares.  Her cardiopulmonary exam is benign.  I suspect that the mucus she is coughing up is actually coming as a result of postnasal drip.  I will treat her for maxillary sinusitis with a 10-day course of Augmentin  875 mg.  I will also prescribe Atrovent  nasal spray to help with the congestion.  We discussed doing sinus irrigation as well as increasing oral fluid intake to help thin mucus out.  Over-the-counter Mucinex may also be helpful.  Additionally, I will prescribe Atrovent  nasal spray for her congestion and Tessalon  Perles and Promethazine  DM cough syrup for cough and congestion.  Return precautions reviewed.  Work note provided.   Final Clinical Impressions(s) / UC Diagnoses   Final diagnoses:  URI with cough and congestion  Acute non-recurrent maxillary sinusitis     Discharge Instructions      The Augmentin  twice daily with food for 10 days for treatment of your sinusitis.  Perform sinus irrigation 2-3 times a day with a [employer] sinus rinse kit and distilled water.  Do not use tap water.  You can use plain over-the-counter Mucinex every 6 hours to break up the stickiness of the mucus so your body can clear it.  Increase your oral fluid intake to thin out your mucus so that is also able for your body to clear more easily.  Take an over-the-counter probiotic, such as Culturelle-align-activia, 1 hour after each dose of antibiotic to prevent diarrhea.  Use the Atrovent  nasal spray, 2 squirts in each nostril every 6 hours, as needed for runny nose and postnasal drip.  Use the Tessalon  Perles every 8  hours during the day.  Take them with a small sip of water.  They may give you some numbness to the base of your tongue or a metallic taste in your mouth, this is normal.  Use the Promethazine  DM cough syrup at bedtime  for cough and congestion.  It will make you drowsy so do not take it during the day.  Return for reevaluation or see your primary care provider for any new or worsening symptoms.      ED Prescriptions     Medication Sig Dispense Auth. Provider   amoxicillin -clavulanate (AUGMENTIN ) 875-125 MG tablet Take 1 tablet by mouth every 12 (twelve) hours for 10 days. 20 tablet Bernardino Ditch, NP   benzonatate  (TESSALON ) 100 MG capsule Take 2 capsules (200 mg total) by mouth every 8 (eight) hours. 21 capsule Bernardino Ditch, NP   ipratropium (ATROVENT ) 0.06 % nasal spray Place 2 sprays into both nostrils 4 (four) times daily. 15 mL Bernardino Ditch, NP   promethazine -dextromethorphan (PROMETHAZINE -DM) 6.25-15 MG/5ML syrup Take 5 mLs by mouth 4 (four) times daily as needed. 118 mL Bernardino Ditch, NP      PDMP not reviewed this encounter.    [1]  Social History Tobacco Use   Smoking status: Never   Smokeless tobacco: Never  Vaping Use   Vaping status: Never Used  Substance Use Topics   Alcohol use: Not Currently    Comment: Occasionally   Drug use: No     Bernardino Ditch, NP 08/21/24 1215

## 2024-08-21 NOTE — Discharge Instructions (Addendum)
 The Augmentin twice daily with food for 10 days for treatment of your sinusitis.  Perform sinus irrigation 2-3 times a day with a [employer]Med sinus rinse kit and distilled water.  Do not use tap water.  You can use plain over-the-counter Mucinex every 6 hours to break up the stickiness of the mucus so your body can clear it.  Increase your oral fluid intake to thin out your mucus so that is also able for your body to clear more easily.  Take an over-the-counter probiotic, such as Culturelle-align-activia, 1 hour after each dose of antibiotic to prevent diarrhea.  Use the Atrovent nasal spray, 2 squirts in each nostril every 6 hours, as needed for runny nose and postnasal drip.  Use the Tessalon Perles every 8 hours during the day.  Take them with a small sip of water.  They may give you some numbness to the base of your tongue or a metallic taste in your mouth, this is normal.  Use the Promethazine DM cough syrup at bedtime for cough and congestion.  It will make you drowsy so do not take it during the day.  Return for reevaluation or see your primary care provider for any new or worsening symptoms.

## 2024-10-09 ENCOUNTER — Ambulatory Visit
Admission: EM | Admit: 2024-10-09 | Discharge: 2024-10-09 | Disposition: A | Discharge status: Home / Self Care | Source: Home / Self Care | Attending: Family Medicine | Admitting: Family Medicine

## 2024-10-09 ENCOUNTER — Ambulatory Visit

## 2024-10-09 DIAGNOSIS — S63501A Unspecified sprain of right wrist, initial encounter: Secondary | ICD-10-CM | POA: Diagnosis not present

## 2024-10-09 DIAGNOSIS — W009XXA Unspecified fall due to ice and snow, initial encounter: Secondary | ICD-10-CM | POA: Diagnosis not present

## 2024-10-09 DIAGNOSIS — S6991XA Unspecified injury of right wrist, hand and finger(s), initial encounter: Secondary | ICD-10-CM | POA: Diagnosis not present

## 2024-10-09 NOTE — ED Provider Notes (Incomplete)
 " [facility]    CSN: [id number] Arrival date & time: 10/09/24  1446      History   Chief Complaint Chief Complaint  Patient presents with   Hand Pain    HPI  HPI Ariana Jacobs is a 59 y.o. female.   Ariana Jacobs presents after a fall last weekend in the snow and ice. Now having right and wrist pain with swelling.  Taking ibuprofen  about 130 PM.  Left work today as she was having increased pain while moving the boxes.  Pain radiates occasionally into her forearm. No prior right hand or wrist injuries. Has been moving her hand per her normal.   She is right handed.      Past Medical History:  Diagnosis Date   Diabetes mellitus without complication (HCC)    Hypertension    Hypertensive emergency 06/23/2023    Patient Active Problem List   Diagnosis Date Noted   Generalized weakness 06/15/2023   Nausea vomiting and diarrhea 06/15/2023   Sepsis secondary to UTI (HCC) 06/15/2023   UTI (urinary tract infection) 06/15/2023   Type 2 diabetes mellitus with hyperglycemia (HCC) 06/07/2023   Uncontrolled hypertension 06/07/2023   Hemorrhagic stroke (HCC) 06/05/2023   Acute nonintractable headache 06/04/2023   Acute right-sided low back pain with right-sided sciatica 06/04/2023    Past Surgical History:  Procedure Laterality Date   CESAREAN SECTION     FOOT SURGERY      OB History   No obstetric history on file.      Home Medications    Prior to Admission medications  Medication Sig Start Date End Date Taking? Authorizing Provider  amLODipine  (NORVASC ) 10 MG tablet Take 10 mg by mouth daily.    [provider]  amLODipine  (NORVASC ) 5 MG tablet Take 1 tablet (5 mg total) by mouth daily. 06/18/23 08/17/23  Lenon Marien CROME, MD  atorvastatin  (LIPITOR ) 40 MG tablet Take 40 mg by mouth at bedtime. 08/16/21   [provider]  benzonatate  (TESSALON ) 100 MG capsule Take 2 capsules (200 mg total) by mouth every 8 (eight)  hours. Patient not taking: Reported on 10/09/2024 08/21/24   Bernardino Ditch, NP  Blood Glucose Monitoring Suppl DEVI 1 each by Does not apply route in the morning, at noon, and at bedtime. May substitute to any manufacturer covered by patient's insurance. 06/09/23   Fausto Sor A, DO  Gabapentin , Once-Daily, 300 MG TABS Take 1 tablet (300 mg total) by mouth at bedtime. 10/27/23 11/26/23  Hollye Pritt, DO  ipratropium (ATROVENT ) 0.06 % nasal spray Place 2 sprays into both nostrils 4 (four) times daily. 08/21/24   Bernardino Ditch, NP  JARDIANCE 25 MG TABS tablet Take 25 mg by mouth daily. 04/21/23   [provider]  LANTUS  SOLOSTAR 100 UNIT/ML Solostar Pen Inject 25 Units into the skin daily. 06/17/23 07/17/23  Lenon Marien CROME, MD  lisinopril -hydrochlorothiazide  (ZESTORETIC ) 20-25 MG tablet Take 1 tablet by mouth daily.    [provider]  metFORMIN (GLUCOPHAGE-XR) 500 MG 24 hr tablet Take 1,000 mg by mouth 2 (two) times daily. 04/21/23   [provider]  methocarbamol  (ROBAXIN ) 500 MG tablet Take 1 tablet (500 mg total) by mouth 2 (two) times daily. Patient not taking: Reported on 10/09/2024 05/06/24   Khiara Shuping, DO  metoprolol  tartrate (LOPRESSOR ) 25 MG tablet Take 0.5 tablets (12.5 mg total) by mouth 2 (two) times daily. 06/17/23 08/21/24  Lenon Marien CROME, MD  omeprazole (PRILOSEC) 20 MG capsule Take 20 mg  by mouth daily. 06/17/23   [provider]  predniSONE  (STERAPRED UNI-PAK 21 TAB) 10 MG (21) TBPK tablet Take by mouth daily. Take 6 tabs by mouth daily for 1, then 5 tabs for 1 day, then 4 tabs for 1 day, then 3 tabs for 1 day, then 2 tabs for 1 day, then 1 tab for 1 day. Patient not taking: Reported on 10/09/2024 05/06/24   Danyelle Brookover, DO  promethazine -dextromethorphan (PROMETHAZINE -DM) 6.25-15 MG/5ML syrup Take 5 mLs by mouth 4 (four) times daily as needed. Patient not taking: Reported on 10/09/2024 08/21/24   Bernardino Ditch, NP  GLIPIZIDE PO Take by mouth  daily.  09/20/19  [provider]  HYDRALAZINE -HCTZ PO Take by mouth daily.  04/07/20  [provider]  hydrochlorothiazide  (HYDRODIURIL ) 25 MG tablet  01/01/19 09/20/19  [provider]  LISINOPRIL  PO Take by mouth daily.  09/20/19  [provider]    Family History Family History  Problem Relation Age of Onset   Healthy Mother    Diabetes Father    Breast cancer Neg Hx     Social History Social History[1]   Allergies   Patient has no known allergies.   Review of Systems Review of Systems: :negative unless otherwise stated in HPI.      Physical Exam Triage Vital Signs ED Triage Vitals  Encounter Vitals Group     BP 10/09/24 1510 139/71     [name] Systolic BP Percentile --      [name] Diastolic BP Percentile --      Boys Systolic BP Percentile --      Boys Diastolic BP Percentile --      Pulse Rate 10/09/24 1510 89     Resp 10/09/24 1510 16     Temp 10/09/24 1510 98.4 F (36.9 C)     Temp Source 10/09/24 1510 Oral     SpO2 10/09/24 1510 100 %     Weight 10/09/24 1510 130 lb (59 kg)     Height --      Head Circumference --      Peak Flow --      Pain Score 10/09/24 1526 7     Pain Loc --      Pain Education --      Exclude from Growth Chart --    No data found.  Updated Vital Signs BP 139/71 (BP Location: Left Arm)   Pulse 89   Temp 98.4 F (36.9 C) (Oral)   Resp 16   Wt 59 kg   SpO2 100%   BMI 26.26 kg/m   Visual Acuity Right Eye Distance:   Left Eye Distance:   Bilateral Distance:    Right Eye Near:   Left Eye Near:    Bilateral Near:     Physical Exam GEN: well appearing female in no acute distress  CVS: well perfused  RESP: speaking in full sentences without pause, no respiratory distress  MSK:   Wrist and Hand right: TTP noted at the distal 2nd-4th metacarpals and across the flexor retinaculum. Inspection yielded no  erythema, ecchymosis, bony deformity. Mild swelling. ROM is good with good flexion and  extension and ulnar/radial deviation that is symmetrical with opposite wrist. Palpation is normal over scaphoid and lunate; tendons with tenderness with mild hand and wrist swelling. Strength 5/5 in all directions without pain. Equivocal Finkelstein  *** Hand: Inspection: No obvious deformity b/l. No swelling, erythema or bruising b/l Palpation: no TTP b/l ROM: Full ROM of the digits  and wrist b/l. Fully able to extend and flex finger. Strength: 5/5 strength in the forearm, wrist and interosseus muscles b/l Neurovascular: NV intact b/l Special tests: ***Negative finkelstein's, negative tinel's at the carpal tunnel, negative Phalen's and reverse Phalen's ***  Fingers:  No swelling in PIP, DIP joints b/l. Flexor digitorum profundus and superficialis tendon functions are intact.  PIP joint collateral ligaments are stable    UC Treatments / Results  Labs (all labs ordered are listed, but only abnormal results are displayed) Labs Reviewed - No data to display  EKG   Radiology DG Hand Complete Right Result Date: 10/09/2024 CLINICAL DATA:  Right hand pain after fall EXAM: DG HAND COMPLETE 3+V*R* COMPARISON:  August 10, 2016 FINDINGS: There is no evidence of fracture or dislocation. There is no evidence of arthropathy or other focal bone abnormality. Soft tissues are unremarkable. IMPRESSION: Negative. Electronically Signed   By: Lynwood Landy Raddle M.D.   On: 10/09/2024 15:59   DG Wrist Complete Right Result Date: 10/09/2024 CLINICAL DATA:  Right wrist pain after fall. EXAM: RIGHT WRIST - COMPLETE 3+ VIEW COMPARISON:  August 10, 2016 FINDINGS: There is no evidence of fracture or dislocation. There is no evidence of arthropathy or other focal bone abnormality. Soft tissues are unremarkable. IMPRESSION: Negative. Electronically Signed   By: Lynwood Landy Raddle M.D.   On: 10/09/2024 15:57     Procedures Procedures (including critical care time)  Medications Ordered in UC Medications - No data to  display  Initial Impression / Assessment and Plan / UC Course  I have reviewed the triage vital signs and the nursing notes.  Pertinent labs & imaging results that were available during my care of the patient were reviewed by me and considered in my medical decision making (see chart for details).      Pt is a 58 y.o.  female with *** days of *** shoulder pain after ***.   On exam, pt has tenderness at *** concerning for ***.   Obtained *** shoulder plain films.  Personally interpreted by me were ***unremarkable for fracture or dislocation. Radiologist report reviewed and additionally notes *** no soft tissue swelling.  Given ***Toradol  IM/sling/brace/crutches  Patient to gradually return to normal activities, as tolerated and continue ordinary activities within the limits permitted by pain. Prescribed Naproxen  sodium *** and muscle relaxer *** for pain relief.  Tylenol  PRN. Advised patient to avoid OTC NSAIDs while taking prescription NSAID. Counseled patient on red flag symptoms and when to seek immediate care.  ***No red flags such as progressive major motor weakness.   Patient to follow up with orthopedic provider, if symptoms do not improve with conservative treatment.  Return and ED precautions given. Understanding voiced. Discussed MDM, treatment plan and plan for follow-up with patient/parent who agrees with plan.   Final Clinical Impressions(s) / UC Diagnoses   Final diagnoses:  None   Discharge Instructions   None    ED Prescriptions   None    PDMP not reviewed this encounter.       [1] Social History Tobacco Use   Smoking status: Never   Smokeless tobacco: Never  Vaping Use   Vaping status: Never Used  Substance Use Topics   Alcohol use: Not Currently    Comment: Occasionally   Drug use: No  "

## 2024-10-09 NOTE — Discharge Instructions (Addendum)
 You sprained your wrist. On review of your xray images, you did not have any fractures or dislocated bones. You should see your results in MyChart.   If medication was prescribed, stop by the pharmacy to pick up your prescriptions.  For your  pain, Take 1000 mg Tylenol   with out without 600 mg ibuprofen  every 6 hours,  as needed for pain. Wear your wrist brace all day and even sleep in it for the next 7 days.  Rest and elevate the affected painful area. As pain recedes, begin normal activities slowly as tolerated.  Follow up with primary care provider or an orthopedic provider, if symptoms persist.  Watch for worsening symptoms such as an increasing  increasing pain, weakness or loss of sensation. Should any of these occur, go to the emergency department immediately.

## 2024-10-09 NOTE — ED Triage Notes (Signed)
 Patient to Urgent Care with complaints of right sided hand and wrist pain after a fall on the ice over the weekend.   Taking ibuprofen  (last dose 1330).
